# Patient Record
Sex: Female | Born: 1945
Health system: Southern US, Community
[De-identification: ages and names within clinical notes are randomized; demographics above are authoritative.]

## PROBLEM LIST (undated history)

## (undated) DIAGNOSIS — K219 Gastro-esophageal reflux disease without esophagitis: Secondary | ICD-10-CM

## (undated) DIAGNOSIS — K5790 Diverticulosis of intestine, part unspecified, without perforation or abscess without bleeding: Secondary | ICD-10-CM

## (undated) DIAGNOSIS — M329 Systemic lupus erythematosus, unspecified: Secondary | ICD-10-CM

## (undated) DIAGNOSIS — M1990 Unspecified osteoarthritis, unspecified site: Secondary | ICD-10-CM

## (undated) DIAGNOSIS — E785 Hyperlipidemia, unspecified: Secondary | ICD-10-CM

## (undated) DIAGNOSIS — H269 Unspecified cataract: Secondary | ICD-10-CM

## (undated) DIAGNOSIS — D175 Benign lipomatous neoplasm of intra-abdominal organs: Secondary | ICD-10-CM

## (undated) DIAGNOSIS — M2560 Stiffness of unspecified joint, not elsewhere classified: Secondary | ICD-10-CM

## (undated) DIAGNOSIS — J90 Pleural effusion, not elsewhere classified: Secondary | ICD-10-CM

## (undated) DIAGNOSIS — K824 Cholesterolosis of gallbladder: Secondary | ICD-10-CM

## (undated) DIAGNOSIS — Z860100 Personal history of colon polyps, unspecified: Secondary | ICD-10-CM

## (undated) DIAGNOSIS — G8929 Other chronic pain: Secondary | ICD-10-CM

## (undated) HISTORY — DX: Diverticulosis of intestine, part unspecified, without perforation or abscess without bleeding: K57.90

## (undated) HISTORY — PX: UPPER GASTROINTESTINAL ENDOSCOPY: SHX188

## (undated) HISTORY — DX: Benign lipomatous neoplasm of intra-abdominal organs: D17.5

## (undated) HISTORY — DX: Unspecified osteoarthritis, unspecified site: M19.90

## (undated) HISTORY — DX: Other chronic pain: G89.29

## (undated) HISTORY — PX: COLONOSCOPY: SHX174

## (undated) HISTORY — PX: CATARACT EXTRACTION, BILATERAL: SHX1313

## (undated) HISTORY — DX: Personal history of colon polyps, unspecified: Z86.0100

## (undated) HISTORY — DX: Stiffness of unspecified joint, not elsewhere classified: M25.60

## (undated) HISTORY — DX: Personal history of colonic polyps: Z86.010

## (undated) HISTORY — PX: BREAST LUMPECTOMY: SHX2

## (undated) HISTORY — DX: Cholesterolosis of gallbladder: K82.4

## (undated) HISTORY — DX: Unspecified cataract: H26.9

## (undated) HISTORY — PX: BREAST EXCISIONAL BIOPSY: SUR124

## (undated) HISTORY — PX: REPLACEMENT TOTAL KNEE: SUR1224

---

## 2012-06-10 DIAGNOSIS — M329 Systemic lupus erythematosus, unspecified: Secondary | ICD-10-CM

## 2012-06-10 DIAGNOSIS — IMO0002 Reserved for concepts with insufficient information to code with codable children: Secondary | ICD-10-CM

## 2012-06-10 HISTORY — DX: Systemic lupus erythematosus, unspecified: M32.9

## 2012-06-10 HISTORY — DX: Reserved for concepts with insufficient information to code with codable children: IMO0002

## 2012-12-03 ENCOUNTER — Encounter (HOSPITAL_COMMUNITY): Payer: Self-pay | Admitting: Emergency Medicine

## 2012-12-03 ENCOUNTER — Emergency Department (HOSPITAL_COMMUNITY)
Admission: EM | Admit: 2012-12-03 | Discharge: 2012-12-03 | Disposition: A | Discharge status: Home / Self Care | Payer: PRIVATE HEALTH INSURANCE | Attending: Emergency Medicine | Admitting: Emergency Medicine

## 2012-12-03 DIAGNOSIS — M329 Systemic lupus erythematosus, unspecified: Secondary | ICD-10-CM | POA: Insufficient documentation

## 2012-12-03 DIAGNOSIS — H6120 Impacted cerumen, unspecified ear: Secondary | ICD-10-CM | POA: Insufficient documentation

## 2012-12-03 DIAGNOSIS — H6121 Impacted cerumen, right ear: Secondary | ICD-10-CM

## 2012-12-03 HISTORY — DX: Systemic lupus erythematosus, unspecified: M32.9

## 2012-12-03 MED ORDER — DOCUSATE SODIUM 50 MG/5ML PO LIQD
50.0000 mg | Freq: Once | ORAL | Status: AC
  Administered 2012-12-03: 50 mg via ORAL
  Filled 2012-12-03: qty 10

## 2012-12-03 NOTE — ED Notes (Signed)
Patient states started having ear pain and "it feels blocked" x 2 months.  Patient states decreased hearing.

## 2012-12-03 NOTE — ED Notes (Addendum)
Flushed pt.s rt. Ear with sterile water/peroxide mixture X3.  Pt. Tolerated procedure without difficulty.  No wax returned on fluid.  Informed [name], PA with the results.  [name], PA came into assist  With wax removal of the lt. Ear. Moderate amount of was removed.  Pt. Tolerated without any difficulty.  Placed colace into pt.s rt. Ear to soften the wax.

## 2012-12-03 NOTE — ED Provider Notes (Signed)
History    CSN: [id number] Arrival date & time 12/03/12  4098  First MD Initiated Contact with Patient 12/03/12 [phone]     Chief Complaint  Patient presents with  . Otalgia   (Consider location/radiation/quality/duration/timing/severity/associated sxs/prior Treatment) Patient is a 67 y.o. female presenting with ear pain.  Otalgia Location:  Left Quality:  Aching Severity:  Mild Onset quality:  Gradual Duration:  2 months Timing:  Constant Progression:  Worsening Chronicity:  Recurrent Context: not direct blow, not elevation change, not foreign body in ear, not loud noise and no water in ear   Relieved by:  Nothing Worsened by:  Nothing tried Ineffective treatments:  None tried Associated symptoms: no abdominal pain, no congestion, no cough, no diarrhea, no ear discharge, no fever, no headaches, no hearing loss, no neck pain, no rash, no rhinorrhea, no sore throat, no tinnitus and no vomiting   Risk factors: no recent travel, no chronic ear infection and no prior ear surgery     Patient presents to the emergency department with chief complaint of left ear pain x2 months.  She has a history of cerumen impaction and states that this feels the same.  She has decreased hearing on that side.  She states that it feels "blocked and stuffy.".  She has some pain but denies any discharge from the ear, she denies any tonsillar swelling, sore throat, nasal discharge.  Patient has no sinus tenderness.  She has no neck pain.  No fevers no chills no nausea no vomiting. Past Medical History  Diagnosis Date  . Lupus    History reviewed. No pertinent past surgical history. No family history on file. History  Substance Use Topics  . Smoking status: Never Smoker   . Smokeless tobacco: Not on file  . Alcohol Use: No   OB History   Grav Para Term Preterm Abortions TAB SAB Ect Mult Living                 Review of Systems  Constitutional: Negative for fever.  HENT: Positive for ear pain.  Negative for hearing loss, congestion, sore throat, rhinorrhea, neck pain, tinnitus and ear discharge.   Respiratory: Negative for cough.   Gastrointestinal: Negative for vomiting, abdominal pain and diarrhea.  Skin: Negative for rash.  Neurological: Negative for headaches.    Allergies  Penicillins  Home Medications   Current Outpatient Rx  Name  Route  Sig  Dispense  Refill  . Cholecalciferol (VITAMIN D PO)   Oral   Take 1 tablet by mouth daily.         Marland Kitchen FOLIC ACID PO   Oral   Take 1 tablet by mouth daily.         . hydroxychloroquine (PLAQUENIL) 200 MG tablet   Oral   Take 300 mg by mouth daily.         . predniSONE (DELTASONE) 5 MG tablet   Oral   Take 5 mg by mouth daily.          BP 119/68  Pulse 80  Temp(Src) 98.6 F (37 C) (Oral)  Resp 18  SpO2 98% Physical Exam  Constitutional: She is oriented to person, place, and time. She appears well-developed and well-nourished. No distress.  HENT:  Head: Normocephalic and atraumatic.  Patient with cerumen bilaterally ears.  Right is soft and pliable however... cures the TM.  Left is thick hard and impacted and also obscures the TM.  No tragal or mastoid tenderness bilaterally full range  of motion of the neck the cervical lymphadenopathy  Eyes: Conjunctivae are normal. No scleral icterus.  Neck: Normal range of motion.  Cardiovascular: Normal rate, regular rhythm and normal heart sounds.  Exam reveals no gallop and no friction rub.   No murmur heard. Pulmonary/Chest: Effort normal and breath sounds normal. No respiratory distress.  Abdominal: Soft. Bowel sounds are normal. She exhibits no distension and no mass. There is no tenderness. There is no guarding.  Neurological: She is alert and oriented to person, place, and time.  Skin: Skin is warm and dry. She is not diaphoretic.    ED Course  Procedures (including critical care time) Labs Reviewed - No data to display No results found. No diagnosis  found.  MDM  I've ordered Colace for softening of the cerumen impaction.10:50 AM BP 119/68  Pulse 80  Temp(Src) 98.6 F (37 C) (Oral)  Resp 18  SpO2 98% Per nursing, irrigation was not working. I attempted to remove cerumen with curette, however, canal was becoming increasingly swollen. I have paced a call to ENT for suggestions.   I have spoken with Dr. Pollyann Kennedy who has asked that the patient   Arthor Captain, PA-C 12/03/12 1654

## 2012-12-03 NOTE — ED Notes (Signed)
[name], PA came in and informed pt. that she will be going directly across the street to ENT.

## 2012-12-03 NOTE — ED Notes (Signed)
Directions given to pt. For ENT;s office.

## 2012-12-04 ENCOUNTER — Other Ambulatory Visit: Payer: Self-pay | Admitting: Otolaryngology

## 2012-12-04 DIAGNOSIS — D497 Neoplasm of unspecified behavior of endocrine glands and other parts of nervous system: Secondary | ICD-10-CM

## 2012-12-09 NOTE — ED Provider Notes (Signed)
Medical screening examination/treatment/procedure(s) were performed by non-physician practitioner and as supervising physician I was immediately available for consultation/collaboration.  Raeford Razor, MD 12/09/12 [phone]

## 2013-02-11 ENCOUNTER — Ambulatory Visit
Admission: RE | Admit: 2013-02-11 | Discharge: 2013-02-11 | Disposition: A | Discharge status: Home / Self Care | Payer: Medicare Other | Source: Ambulatory Visit | Attending: Otolaryngology | Admitting: Otolaryngology

## 2013-02-11 DIAGNOSIS — E042 Nontoxic multinodular goiter: Secondary | ICD-10-CM | POA: Diagnosis not present

## 2013-02-11 DIAGNOSIS — D497 Neoplasm of unspecified behavior of endocrine glands and other parts of nervous system: Secondary | ICD-10-CM

## 2013-02-12 ENCOUNTER — Other Ambulatory Visit: Payer: Self-pay | Admitting: Otolaryngology

## 2013-02-12 ENCOUNTER — Other Ambulatory Visit (HOSPITAL_COMMUNITY)
Admission: RE | Admit: 2013-02-12 | Discharge: 2013-02-12 | Disposition: A | Discharge status: Home / Self Care | Payer: Medicare Other | Source: Ambulatory Visit | Attending: Otolaryngology | Admitting: Otolaryngology

## 2013-02-12 DIAGNOSIS — D497 Neoplasm of unspecified behavior of endocrine glands and other parts of nervous system: Secondary | ICD-10-CM | POA: Diagnosis not present

## 2013-02-12 DIAGNOSIS — E041 Nontoxic single thyroid nodule: Secondary | ICD-10-CM | POA: Diagnosis not present

## 2013-02-18 DIAGNOSIS — N951 Menopausal and female climacteric states: Secondary | ICD-10-CM | POA: Diagnosis not present

## 2013-02-18 DIAGNOSIS — M329 Systemic lupus erythematosus, unspecified: Secondary | ICD-10-CM | POA: Diagnosis not present

## 2013-02-18 DIAGNOSIS — Z0000 Encounter for general adult medical examination without abnormal findings: Secondary | ICD-10-CM | POA: Diagnosis not present

## 2013-02-18 DIAGNOSIS — M1990 Unspecified osteoarthritis, unspecified site: Secondary | ICD-10-CM | POA: Diagnosis not present

## 2013-02-19 DIAGNOSIS — Z23 Encounter for immunization: Secondary | ICD-10-CM | POA: Diagnosis not present

## 2013-02-19 DIAGNOSIS — N951 Menopausal and female climacteric states: Secondary | ICD-10-CM | POA: Diagnosis not present

## 2013-02-19 DIAGNOSIS — M329 Systemic lupus erythematosus, unspecified: Secondary | ICD-10-CM | POA: Diagnosis not present

## 2013-02-19 DIAGNOSIS — E559 Vitamin D deficiency, unspecified: Secondary | ICD-10-CM | POA: Diagnosis not present

## 2013-03-01 DIAGNOSIS — Z79899 Other long term (current) drug therapy: Secondary | ICD-10-CM | POA: Diagnosis not present

## 2013-03-01 DIAGNOSIS — H4420 Degenerative myopia, unspecified eye: Secondary | ICD-10-CM | POA: Diagnosis not present

## 2013-03-01 DIAGNOSIS — H2510 Age-related nuclear cataract, unspecified eye: Secondary | ICD-10-CM | POA: Diagnosis not present

## 2013-03-01 DIAGNOSIS — H04129 Dry eye syndrome of unspecified lacrimal gland: Secondary | ICD-10-CM | POA: Diagnosis not present

## 2013-03-01 DIAGNOSIS — H43819 Vitreous degeneration, unspecified eye: Secondary | ICD-10-CM | POA: Diagnosis not present

## 2013-03-01 DIAGNOSIS — H40019 Open angle with borderline findings, low risk, unspecified eye: Secondary | ICD-10-CM | POA: Diagnosis not present

## 2013-03-18 DIAGNOSIS — H35059 Retinal neovascularization, unspecified, unspecified eye: Secondary | ICD-10-CM | POA: Diagnosis not present

## 2013-03-18 DIAGNOSIS — H4420 Degenerative myopia, unspecified eye: Secondary | ICD-10-CM | POA: Diagnosis not present

## 2013-03-18 DIAGNOSIS — H33329 Round hole, unspecified eye: Secondary | ICD-10-CM | POA: Diagnosis not present

## 2013-03-18 DIAGNOSIS — H35419 Lattice degeneration of retina, unspecified eye: Secondary | ICD-10-CM | POA: Diagnosis not present

## 2013-03-18 DIAGNOSIS — H2510 Age-related nuclear cataract, unspecified eye: Secondary | ICD-10-CM | POA: Diagnosis not present

## 2013-04-07 DIAGNOSIS — M899 Disorder of bone, unspecified: Secondary | ICD-10-CM | POA: Diagnosis not present

## 2013-04-07 DIAGNOSIS — M159 Polyosteoarthritis, unspecified: Secondary | ICD-10-CM | POA: Diagnosis not present

## 2013-04-07 DIAGNOSIS — M329 Systemic lupus erythematosus, unspecified: Secondary | ICD-10-CM | POA: Diagnosis not present

## 2013-04-07 DIAGNOSIS — M3500 Sicca syndrome, unspecified: Secondary | ICD-10-CM | POA: Diagnosis not present

## 2013-08-17 DIAGNOSIS — M949 Disorder of cartilage, unspecified: Secondary | ICD-10-CM | POA: Diagnosis not present

## 2013-08-17 DIAGNOSIS — M3500 Sicca syndrome, unspecified: Secondary | ICD-10-CM | POA: Diagnosis not present

## 2013-08-17 DIAGNOSIS — M329 Systemic lupus erythematosus, unspecified: Secondary | ICD-10-CM | POA: Diagnosis not present

## 2013-08-17 DIAGNOSIS — M159 Polyosteoarthritis, unspecified: Secondary | ICD-10-CM | POA: Diagnosis not present

## 2013-08-17 DIAGNOSIS — M899 Disorder of bone, unspecified: Secondary | ICD-10-CM | POA: Diagnosis not present

## 2013-08-18 DIAGNOSIS — R5383 Other fatigue: Secondary | ICD-10-CM | POA: Diagnosis not present

## 2013-08-18 DIAGNOSIS — R5381 Other malaise: Secondary | ICD-10-CM | POA: Diagnosis not present

## 2013-08-18 DIAGNOSIS — R0789 Other chest pain: Secondary | ICD-10-CM | POA: Diagnosis not present

## 2013-08-18 DIAGNOSIS — M329 Systemic lupus erythematosus, unspecified: Secondary | ICD-10-CM | POA: Diagnosis not present

## 2013-09-02 DIAGNOSIS — M1990 Unspecified osteoarthritis, unspecified site: Secondary | ICD-10-CM | POA: Diagnosis not present

## 2013-09-02 DIAGNOSIS — M329 Systemic lupus erythematosus, unspecified: Secondary | ICD-10-CM | POA: Diagnosis not present

## 2014-12-05 ENCOUNTER — Other Ambulatory Visit: Payer: Self-pay | Admitting: Internal Medicine

## 2014-12-05 DIAGNOSIS — R1901 Right upper quadrant abdominal swelling, mass and lump: Secondary | ICD-10-CM | POA: Diagnosis not present

## 2014-12-05 DIAGNOSIS — R1011 Right upper quadrant pain: Secondary | ICD-10-CM

## 2014-12-06 ENCOUNTER — Other Ambulatory Visit: Payer: Self-pay | Admitting: Internal Medicine

## 2014-12-06 ENCOUNTER — Ambulatory Visit
Admission: RE | Admit: 2014-12-06 | Discharge: 2014-12-06 | Disposition: A | Discharge status: Home / Self Care | Payer: Medicare Other | Source: Ambulatory Visit | Attending: Internal Medicine | Admitting: Internal Medicine

## 2014-12-06 DIAGNOSIS — R1011 Right upper quadrant pain: Secondary | ICD-10-CM | POA: Diagnosis not present

## 2014-12-15 DIAGNOSIS — R1011 Right upper quadrant pain: Secondary | ICD-10-CM | POA: Diagnosis not present

## 2014-12-15 DIAGNOSIS — R1901 Right upper quadrant abdominal swelling, mass and lump: Secondary | ICD-10-CM | POA: Diagnosis not present

## 2014-12-15 DIAGNOSIS — M329 Systemic lupus erythematosus, unspecified: Secondary | ICD-10-CM | POA: Diagnosis not present

## 2014-12-15 DIAGNOSIS — R5383 Other fatigue: Secondary | ICD-10-CM | POA: Diagnosis not present

## 2015-03-30 DIAGNOSIS — E559 Vitamin D deficiency, unspecified: Secondary | ICD-10-CM | POA: Diagnosis not present

## 2015-03-30 DIAGNOSIS — R1901 Right upper quadrant abdominal swelling, mass and lump: Secondary | ICD-10-CM | POA: Diagnosis not present

## 2015-03-30 DIAGNOSIS — M329 Systemic lupus erythematosus, unspecified: Secondary | ICD-10-CM | POA: Diagnosis not present

## 2015-03-30 DIAGNOSIS — E782 Mixed hyperlipidemia: Secondary | ICD-10-CM | POA: Diagnosis not present

## 2015-03-30 DIAGNOSIS — Z23 Encounter for immunization: Secondary | ICD-10-CM | POA: Diagnosis not present

## 2015-03-30 DIAGNOSIS — R5383 Other fatigue: Secondary | ICD-10-CM | POA: Diagnosis not present

## 2015-03-30 DIAGNOSIS — Z780 Asymptomatic menopausal state: Secondary | ICD-10-CM | POA: Diagnosis not present

## 2015-03-30 DIAGNOSIS — M150 Primary generalized (osteo)arthritis: Secondary | ICD-10-CM | POA: Diagnosis not present

## 2015-04-05 DIAGNOSIS — M150 Primary generalized (osteo)arthritis: Secondary | ICD-10-CM | POA: Diagnosis not present

## 2015-04-05 DIAGNOSIS — R21 Rash and other nonspecific skin eruption: Secondary | ICD-10-CM | POA: Diagnosis not present

## 2015-04-05 DIAGNOSIS — Z23 Encounter for immunization: Secondary | ICD-10-CM | POA: Diagnosis not present

## 2015-04-05 DIAGNOSIS — E782 Mixed hyperlipidemia: Secondary | ICD-10-CM | POA: Diagnosis not present

## 2015-04-05 DIAGNOSIS — M329 Systemic lupus erythematosus, unspecified: Secondary | ICD-10-CM | POA: Diagnosis not present

## 2015-04-14 DIAGNOSIS — L931 Subacute cutaneous lupus erythematosus: Secondary | ICD-10-CM | POA: Diagnosis not present

## 2015-05-19 DIAGNOSIS — E782 Mixed hyperlipidemia: Secondary | ICD-10-CM | POA: Diagnosis not present

## 2015-05-19 DIAGNOSIS — M329 Systemic lupus erythematosus, unspecified: Secondary | ICD-10-CM | POA: Diagnosis not present

## 2015-05-26 DIAGNOSIS — R490 Dysphonia: Secondary | ICD-10-CM | POA: Diagnosis not present

## 2015-05-26 DIAGNOSIS — R1901 Right upper quadrant abdominal swelling, mass and lump: Secondary | ICD-10-CM | POA: Diagnosis not present

## 2015-05-26 DIAGNOSIS — E782 Mixed hyperlipidemia: Secondary | ICD-10-CM | POA: Diagnosis not present

## 2015-05-26 DIAGNOSIS — M329 Systemic lupus erythematosus, unspecified: Secondary | ICD-10-CM | POA: Diagnosis not present

## 2015-07-04 DIAGNOSIS — K219 Gastro-esophageal reflux disease without esophagitis: Secondary | ICD-10-CM | POA: Diagnosis not present

## 2015-07-04 DIAGNOSIS — R490 Dysphonia: Secondary | ICD-10-CM | POA: Diagnosis not present

## 2015-08-02 DIAGNOSIS — K219 Gastro-esophageal reflux disease without esophagitis: Secondary | ICD-10-CM | POA: Diagnosis not present

## 2015-08-02 DIAGNOSIS — R490 Dysphonia: Secondary | ICD-10-CM | POA: Diagnosis not present

## 2015-09-06 ENCOUNTER — Encounter (HOSPITAL_COMMUNITY): Payer: Self-pay | Admitting: Emergency Medicine

## 2015-09-06 DIAGNOSIS — R05 Cough: Secondary | ICD-10-CM | POA: Diagnosis not present

## 2015-09-06 DIAGNOSIS — R079 Chest pain, unspecified: Secondary | ICD-10-CM | POA: Diagnosis not present

## 2015-09-06 DIAGNOSIS — R61 Generalized hyperhidrosis: Secondary | ICD-10-CM | POA: Insufficient documentation

## 2015-09-06 DIAGNOSIS — R0602 Shortness of breath: Secondary | ICD-10-CM | POA: Insufficient documentation

## 2015-09-06 NOTE — ED Notes (Signed)
Pt. reports central chest pressure with mild SOB , dry cough  and diaphoresis , denies fever / no emesis .

## 2015-09-07 ENCOUNTER — Emergency Department (HOSPITAL_COMMUNITY): Payer: Medicare Other

## 2015-09-07 ENCOUNTER — Emergency Department (HOSPITAL_COMMUNITY)
Admission: EM | Admit: 2015-09-07 | Discharge: 2015-09-07 | Disposition: A | Discharge status: Home / Self Care | Payer: Medicare Other | Attending: Emergency Medicine | Admitting: Emergency Medicine

## 2015-09-07 DIAGNOSIS — R0602 Shortness of breath: Secondary | ICD-10-CM | POA: Diagnosis not present

## 2015-09-07 DIAGNOSIS — R079 Chest pain, unspecified: Secondary | ICD-10-CM | POA: Diagnosis not present

## 2015-09-07 LAB — BASIC METABOLIC PANEL
Anion gap: 8 (ref 5–15)
BUN: 14 mg/dL (ref 6–20)
CO2: 22 mmol/L (ref 22–32)
Calcium: 8.6 mg/dL — ABNORMAL LOW (ref 8.9–10.3)
Chloride: 111 mmol/L (ref 101–111)
Creatinine, Ser: 0.91 mg/dL (ref 0.44–1.00)
GFR calc Af Amer: >60 mL/min (ref >60)
GFR calc non Af Amer: >60 mL/min (ref >60)
Glucose, Bld: 101 mg/dL — ABNORMAL HIGH (ref 65–99)
Potassium: 4.3 mmol/L (ref 3.5–5.1)
Sodium: 141 mmol/L (ref 135–145)

## 2015-09-07 LAB — CBC
HCT: 43 % (ref 36.0–46.0)
Hemoglobin: 14.1 g/dL (ref 12.0–15.0)
MCH: 29.2 pg (ref 26.0–34.0)
MCHC: 32.8 g/dL (ref 30.0–36.0)
MCV: 89 fL (ref 78.0–100.0)
Platelets: 121 10*3/uL — ABNORMAL LOW (ref 150–400)
RBC: 4.83 MIL/uL (ref 3.87–5.11)
RDW: 14.6 % (ref 11.5–15.5)
WBC: 8.7 10*3/uL (ref 4.0–10.5)

## 2015-09-07 LAB — I-STAT TROPONIN, ED: Troponin i, poc: 0 ng/mL (ref 0.00–0.08)

## 2015-09-07 NOTE — ED Notes (Signed)
Pt no answer x2 when called in waiting room.

## 2015-09-09 DIAGNOSIS — J90 Pleural effusion, not elsewhere classified: Secondary | ICD-10-CM

## 2015-09-09 HISTORY — DX: Pleural effusion, not elsewhere classified: J90

## 2015-09-21 ENCOUNTER — Encounter (HOSPITAL_COMMUNITY): Payer: Self-pay | Admitting: *Deleted

## 2015-09-21 ENCOUNTER — Emergency Department (HOSPITAL_COMMUNITY): Payer: Medicare Other

## 2015-09-21 ENCOUNTER — Inpatient Hospital Stay (HOSPITAL_COMMUNITY)
Admission: EM | Admit: 2015-09-21 | Discharge: 2015-09-24 | DRG: 291 | Disposition: A | Discharge status: Home / Self Care | Payer: Medicare Other | Attending: Internal Medicine | Admitting: Internal Medicine

## 2015-09-21 DIAGNOSIS — Z880 Allergy status to penicillin: Secondary | ICD-10-CM | POA: Diagnosis not present

## 2015-09-21 DIAGNOSIS — E785 Hyperlipidemia, unspecified: Secondary | ICD-10-CM | POA: Diagnosis not present

## 2015-09-21 DIAGNOSIS — J90 Pleural effusion, not elsewhere classified: Secondary | ICD-10-CM | POA: Diagnosis not present

## 2015-09-21 DIAGNOSIS — R05 Cough: Secondary | ICD-10-CM | POA: Diagnosis not present

## 2015-09-21 DIAGNOSIS — Z66 Do not resuscitate: Secondary | ICD-10-CM | POA: Diagnosis present

## 2015-09-21 DIAGNOSIS — K219 Gastro-esophageal reflux disease without esophagitis: Secondary | ICD-10-CM | POA: Diagnosis present

## 2015-09-21 DIAGNOSIS — Z96651 Presence of right artificial knee joint: Secondary | ICD-10-CM | POA: Diagnosis not present

## 2015-09-21 DIAGNOSIS — M329 Systemic lupus erythematosus, unspecified: Secondary | ICD-10-CM | POA: Diagnosis not present

## 2015-09-21 DIAGNOSIS — E876 Hypokalemia: Secondary | ICD-10-CM | POA: Diagnosis not present

## 2015-09-21 DIAGNOSIS — Z7952 Long term (current) use of systemic steroids: Secondary | ICD-10-CM | POA: Diagnosis not present

## 2015-09-21 DIAGNOSIS — I5032 Chronic diastolic (congestive) heart failure: Secondary | ICD-10-CM | POA: Diagnosis not present

## 2015-09-21 DIAGNOSIS — R221 Localized swelling, mass and lump, neck: Secondary | ICD-10-CM

## 2015-09-21 DIAGNOSIS — Z86711 Personal history of pulmonary embolism: Secondary | ICD-10-CM | POA: Diagnosis not present

## 2015-09-21 DIAGNOSIS — R0600 Dyspnea, unspecified: Secondary | ICD-10-CM

## 2015-09-21 DIAGNOSIS — J9601 Acute respiratory failure with hypoxia: Secondary | ICD-10-CM | POA: Diagnosis not present

## 2015-09-21 DIAGNOSIS — R0602 Shortness of breath: Secondary | ICD-10-CM | POA: Diagnosis not present

## 2015-09-21 DIAGNOSIS — D696 Thrombocytopenia, unspecified: Secondary | ICD-10-CM | POA: Diagnosis present

## 2015-09-21 DIAGNOSIS — R0789 Other chest pain: Secondary | ICD-10-CM | POA: Diagnosis not present

## 2015-09-21 HISTORY — DX: Hyperlipidemia, unspecified: E78.5

## 2015-09-21 HISTORY — DX: Pleural effusion, not elsewhere classified: J90

## 2015-09-21 HISTORY — DX: Gastro-esophageal reflux disease without esophagitis: K21.9

## 2015-09-21 LAB — HEPATIC FUNCTION PANEL
ALT: 15 U/L (ref 14–54)
AST: 24 U/L (ref 15–41)
Albumin: 2.4 g/dL — ABNORMAL LOW (ref 3.5–5.0)
Alkaline Phosphatase: 41 U/L (ref 38–126)
Bilirubin, Direct: 0.2 mg/dL (ref 0.1–0.5)
Indirect Bilirubin: 0.5 mg/dL (ref 0.3–0.9)
Total Bilirubin: 0.7 mg/dL (ref 0.3–1.2)
Total Protein: 6.6 g/dL (ref 6.5–8.1)

## 2015-09-21 LAB — D-DIMER, QUANTITATIVE (NOT AT ARMC): D-Dimer, Quant: 1.85 ug/mL-FEU — ABNORMAL HIGH (ref 0.00–0.50)

## 2015-09-21 LAB — BRAIN NATRIURETIC PEPTIDE: B Natriuretic Peptide: 35 pg/mL (ref 0.0–100.0)

## 2015-09-21 LAB — BASIC METABOLIC PANEL
Anion gap: 12 (ref 5–15)
BUN: 15 mg/dL (ref 6–20)
CO2: 23 mmol/L (ref 22–32)
Calcium: 8.4 mg/dL — ABNORMAL LOW (ref 8.9–10.3)
Chloride: 107 mmol/L (ref 101–111)
Creatinine, Ser: 0.71 mg/dL (ref 0.44–1.00)
GFR calc Af Amer: >60 mL/min (ref >60)
GFR calc non Af Amer: >60 mL/min (ref >60)
Glucose, Bld: 98 mg/dL (ref 65–99)
Potassium: 3.2 mmol/L — ABNORMAL LOW (ref 3.5–5.1)
Sodium: 142 mmol/L (ref 135–145)

## 2015-09-21 LAB — CBC
HCT: 41.8 % (ref 36.0–46.0)
Hemoglobin: 13.8 g/dL (ref 12.0–15.0)
MCH: 28.8 pg (ref 26.0–34.0)
MCHC: 33 g/dL (ref 30.0–36.0)
MCV: 87.3 fL (ref 78.0–100.0)
Platelets: 99 10*3/uL — ABNORMAL LOW (ref 150–400)
RBC: 4.79 MIL/uL (ref 3.87–5.11)
RDW: 14.7 % (ref 11.5–15.5)
WBC: 8.3 10*3/uL (ref 4.0–10.5)

## 2015-09-21 LAB — TROPONIN I: Troponin I: <0.03 ng/mL (ref <0.031)

## 2015-09-21 LAB — I-STAT TROPONIN, ED: Troponin i, poc: 0.01 ng/mL (ref 0.00–0.08)

## 2015-09-21 MED ORDER — FUROSEMIDE 10 MG/ML IJ SOLN
40.0000 mg | Freq: Two times a day (BID) | INTRAMUSCULAR | Status: AC
  Administered 2015-09-21 – 2015-09-22 (×3): 40 mg via INTRAVENOUS
  Filled 2015-09-21 (×3): qty 4

## 2015-09-21 MED ORDER — SENNOSIDES-DOCUSATE SODIUM 8.6-50 MG PO TABS: 1.0000 | ORAL_TABLET | Freq: Every evening | ORAL | Status: DC | PRN

## 2015-09-21 MED ORDER — ACETAMINOPHEN 650 MG RE SUPP: 650.0000 mg | Freq: Four times a day (QID) | RECTAL | Status: DC | PRN

## 2015-09-21 MED ORDER — PANTOPRAZOLE SODIUM 40 MG PO TBEC
40.0000 mg | DELAYED_RELEASE_TABLET | Freq: Every day | ORAL | Status: DC
  Administered 2015-09-21 – 2015-09-23 (×3): 40 mg via ORAL
  Filled 2015-09-21 (×3): qty 1

## 2015-09-21 MED ORDER — POTASSIUM CHLORIDE CRYS ER 20 MEQ PO TBCR
40.0000 meq | EXTENDED_RELEASE_TABLET | Freq: Two times a day (BID) | ORAL | Status: DC
  Administered 2015-09-21 – 2015-09-23 (×6): 40 meq via ORAL
  Filled 2015-09-21 (×6): qty 2

## 2015-09-21 MED ORDER — GUAIFENESIN-DM 100-10 MG/5ML PO SYRP
5.0000 mL | ORAL_SOLUTION | ORAL | Status: DC | PRN
  Administered 2015-09-21 – 2015-09-24 (×2): 5 mL via ORAL
  Filled 2015-09-21 (×2): qty 5

## 2015-09-21 MED ORDER — KCL IN DEXTROSE-NACL 20-5-0.45 MEQ/L-%-% IV SOLN
INTRAVENOUS | Status: AC
  Administered 2015-09-21 (×2): via INTRAVENOUS
  Filled 2015-09-21 (×3): qty 1000

## 2015-09-21 MED ORDER — ONDANSETRON HCL 4 MG/2ML IJ SOLN: 4.0000 mg | Freq: Four times a day (QID) | INTRAMUSCULAR | Status: DC | PRN

## 2015-09-21 MED ORDER — ALBUTEROL SULFATE (2.5 MG/3ML) 0.083% IN NEBU: 2.5000 mg | INHALATION_SOLUTION | RESPIRATORY_TRACT | Status: DC | PRN

## 2015-09-21 MED ORDER — HYDROCODONE-ACETAMINOPHEN 5-325 MG PO TABS: 1.0000 | ORAL_TABLET | ORAL | Status: DC | PRN

## 2015-09-21 MED ORDER — ONDANSETRON HCL 4 MG PO TABS: 4.0000 mg | ORAL_TABLET | Freq: Four times a day (QID) | ORAL | Status: DC | PRN

## 2015-09-21 MED ORDER — ACETAMINOPHEN 325 MG PO TABS
650.0000 mg | ORAL_TABLET | Freq: Four times a day (QID) | ORAL | Status: DC | PRN
  Administered 2015-09-22 (×2): 650 mg via ORAL
  Filled 2015-09-21 (×2): qty 2

## 2015-09-21 MED ORDER — ATORVASTATIN CALCIUM 20 MG PO TABS
20.0000 mg | ORAL_TABLET | Freq: Every day | ORAL | Status: DC
  Administered 2015-09-21 – 2015-09-23 (×3): 20 mg via ORAL
  Filled 2015-09-21 (×3): qty 1

## 2015-09-21 MED ORDER — IOPAMIDOL (ISOVUE-370) INJECTION 76%
INTRAVENOUS | Status: AC
  Administered 2015-09-21: 100 mL
  Filled 2015-09-21: qty 100

## 2015-09-21 MED ORDER — FUROSEMIDE 10 MG/ML IJ SOLN
40.0000 mg | Freq: Once | INTRAMUSCULAR | Status: AC
  Administered 2015-09-21: 40 mg via INTRAVENOUS
  Filled 2015-09-21: qty 4

## 2015-09-21 NOTE — H&P (Signed)
[facility] History and Physical  Jane Chavez DOB: [birth year] DOA: 09/21/2015  Referring physician:  PCP: No PCP Per Patient   Chief Complaint: "I just felt short of breath and had pain in my back."  HPI: Jane Chavez is a 70 y.o. female  With a past medical history of lupus hyperlipidemia and GERD presents with complaint of shortness of breath for the past 2-3 weeks. Patient states that onset was sudden with gradual worsening over last few weeks. Patient states that she came in last night to the emergency room due to pain that was new. Pain is in the upper back and does not radiate. Patient is unable to characterize the quality of the pain. Patient rates the pain is moderate to severe. Patient denies any trauma to the chest. No recent upper respiratory tract infections. Patient denies any recent fever cough nausea vomiting hemoptysis dizziness weakness.  Patient has history significant for lupus. Was on chronic prednisone at 5 mg until recently, stopped in the last year.    CODE STATUS DNR/DNI   Review of Systems:  Constitutional:  No weight loss, night sweats, Fevers, chills, fatigue.  HEENT:  No headaches, Difficulty swallowing,Tooth/dental problems; pos Sore throat (due to GERD) Cardio-vascular:  No chest pain, Orthopnea, PND, swelling in lower extremities, anasarca, dizziness, palpitations  GI:  No abdominal pain, nausea, vomiting, diarrhea, change in bowel habits, loss of appetite; positive heartburn Resp:   See history of present illness Skin:  no rash or lesions.  GU:  no dysuria, change in color of urine, no urgency or frequency.  Musculoskeletal:   No joint pain or swelling. No decreased range of motion. No back pain.  Psych:  No change in mood or affect. No depression or anxiety. No memory loss. Neuro:  No change in sensation, unilateral strength, or cognitive abilities All other systems were reviewed and are negative.  Past Medical  History  Diagnosis Date  . Lupus ([place])   . GERD (gastroesophageal reflux disease)   . HLD (hyperlipidemia)    Past Surgical History  Procedure Laterality Date  . Replacement total knee      Right  . Breast lumpectomy     Social History:  reports that she has never smoked. She does not have any smokeless tobacco history on file. She reports that she does not drink alcohol or use illicit drugs.  Allergies  Allergen Reactions  . Penicillins Shortness Of Breath    Has patient had a PCN reaction causing immediate rash, facial/tongue/throat swelling, SOB or lightheadedness with hypotension: Yes Has patient had a PCN reaction causing severe rash involving mucus membranes or skin necrosis: No Has patient had a PCN reaction that required hospitalization No Has patient had a PCN reaction occurring within the last 10 years: No If all of the above answers are "NO", then may proceed with Cephalosporin use.     History reviewed. No pertinent family history.   Prior to Admission medications   Medication Sig Start Date End Date Taking? Authorizing Provider  atorvastatin (LIPITOR) 20 MG tablet Take 20 mg by mouth daily.   Yes Historical Provider, MD  omeprazole (PRILOSEC) 20 MG capsule Take 20 mg by mouth daily.   Yes Historical Provider, MD   Physical Exam: Filed Vitals:   09/21/15 1100 09/21/15 1124 09/21/15 1130 09/21/15 1200  BP: 104/59 104/59 120/70 104/66  Pulse: 100 101 101 93  Temp:  97.9 F (36.6 C)    TempSrc:  Oral    Resp: 17 25  26 27  SpO2: 96% 97% 97% 97%    Wt Readings from Last 3 Encounters:  09/06/15 71.016 kg (156 lb 9 oz)    General: Appears calm and comfortable Eyes: PERRL, EOMI, normal lids, iris ENT: grossly normal hearing, lips & tongue Neck: no LAD, masses or thyromegaly Cardiovascular: tachy RR, no m/r/g. No LE edema.  Respiratory: Increased respiratory effort, tachypnea, decrease lung sounds basilar bilaterally, no wheezes Abdomen:  soft, ntnd Skin: no  rash or induration seen on limited exam Musculoskeletal: grossly normal tone BUE/BLE Psychiatric:  grossly normal mood and affect, speech fluent and appropriate Neurologic:  CN 2-12 grossly intact, moves all extremities in coordinated fashion.          Labs on Admission:  Basic Metabolic Panel:  Recent Labs Lab 09/21/15 0324  NA 142  K 3.2*  CL 107  CO2 23  GLUCOSE 98  BUN 15  CREATININE 0.71  CALCIUM 8.4*   Liver Function Tests:  Recent Labs Lab 09/21/15 0744  AST 24  ALT 15  ALKPHOS 41  BILITOT 0.7  PROT 6.6  ALBUMIN 2.4*   No results for input(s): LIPASE, AMYLASE in the last 168 hours. No results for input(s): AMMONIA in the last 168 hours. CBC:  Recent Labs Lab 09/21/15 0324  WBC 8.3  HGB 13.8  HCT 41.8  MCV 87.3  PLT 99*   Cardiac Enzymes:  Recent Labs Lab 09/21/15 0733  TROPONINI <0.03    BNP (last 3 results)  Recent Labs  09/21/15 0733  BNP 35.0    ProBNP (last 3 results) No results for input(s): PROBNP in the last 8760 hours.   Creatinine clearance cannot be calculated (Unknown ideal weight.)  CBG: No results for input(s): GLUCAP in the last 168 hours.  Radiological Exams on Admission: Dg Chest 2 View  09/21/2015  CLINICAL DATA:  Acute onset of mid chest pain and cough. Initial encounter. EXAM: CHEST  2 VIEW COMPARISON:  Chest radiograph performed 09/07/2015 FINDINGS: The lungs are well-aerated. Small to moderate bilateral pleural effusions are noted, left greater than right. Mild bibasilar opacities may reflect mild interstitial edema. There is no evidence of pneumothorax. The heart is mildly enlarged. No acute osseous abnormalities are seen. IMPRESSION: Small to moderate bilateral pleural effusions, left greater than right. Mild bibasilar opacities may reflect mild interstitial edema. Electronically Signed   By: Garald Balding M.D.   On: 09/21/2015 03:57   Ct Angio Chest Pe W/cm &/or Wo Cm  09/21/2015  CLINICAL DATA:  Shortness of  breath. EXAM: CT ANGIOGRAPHY CHEST WITH CONTRAST TECHNIQUE: Multidetector CT imaging of the chest was performed using the standard protocol during bolus administration of intravenous contrast. Multiplanar CT image reconstructions and MIPs were obtained to evaluate the vascular anatomy. CONTRAST:  100 mL of Isovue 370 intravenously. COMPARISON:  Chest radiograph of same day. FINDINGS: No pneumothorax is noted. Large bilateral pleural effusions are noted with adjacent atelectasis of the lower lobes, left greater than right. Diffusely enlarged thyroid gland is noted with possible left thyroid mass. There is no definite evidence of pulmonary embolus. There is no evidence of thoracic aortic dissection or aneurysm. Visualized portion of upper abdomen is unremarkable. No mediastinal adenopathy is noted. No significant osseous abnormality is noted. Review of the MIP images confirms the above findings. IMPRESSION: No definite evidence of pulmonary embolus. Large bilateral pleural effusions are noted with adjacent atelectasis of the lower lobes, left greater than right. Diffusely enlarged thyroid gland is noted with possible left thyroid mass.  Dedicated thyroid ultrasound is recommended on nonemergent basis. Electronically Signed   By: Marijo Conception, M.D.   On: 09/21/2015 10:02    EKG: Unable to view in  MUSE  Assessment/Plan Principal Problem:   Pleural effusion Active Problems:   GERD (gastroesophageal reflux disease)   Pleural effusion -Patient is in mild to moderate respiratory distress, will do a therapeutic/diagnostic thoracentesis Ultrasound thoracentesis ordered with labs Differential diagnosis: Empyema, CHF, inflammatory fluid collection related to lupus Incentive spirometer Continue Lasix started in the ED AM Labs  Hypokalemia - oral replacement, recheck in AM  GERD -Per history lectin control Switching patient to pantoprazole while inpatient   Code Status: dnr/dni  DVT Prophylaxis:  SCD Family Communication: spoke to son Disposition Plan: Pending Improvement    Elwin Mocha, MD Family Medicine [facility] [email or web address] Password [email or web address]

## 2015-09-21 NOTE — ED Notes (Signed)
Admitting MD is at the bedside 

## 2015-09-21 NOTE — ED Provider Notes (Signed)
CSN: [id number]     Arrival date & time 09/21/15  0308 History   First MD Initiated Contact with Patient 09/21/15 [phone]     Chief Complaint  Patient presents with  . Chest Pain     (Consider location/radiation/quality/duration/timing/severity/associated sxs/prior Treatment) HPI Comments: Patient presents with shortness of breath that has been progressively worsening since March 30. She came to the ED that day but left due to the wait and was not seen. She returns with worsening shortness of breath, dyspnea on exertion and orthopnea. States she has a cough productive of clear and intact mucous. No fever. For the past week she's had constant chest tightness that does not radiate. She endorses no fever or vomiting. No leg swelling or weight gain. No history of heart failure or CAD. She does have a history of lupus and previous pulmonary embolism about 10 years ago. She is not on any blood thinners or other medications for lupus. She denies ever having a heart attack. She denies any fever or chills.  Patient is a 70 y.o. female presenting with chest pain. The history is provided by the patient.  Chest Pain Associated symptoms: abdominal pain, cough, fatigue and shortness of breath   Associated symptoms: no dizziness, no headache, no nausea and not vomiting     Past Medical History  Diagnosis Date  . Lupus ([place])   . GERD (gastroesophageal reflux disease)   . HLD (hyperlipidemia)    Past Surgical History  Procedure Laterality Date  . Replacement total knee      Right  . Breast lumpectomy     History reviewed. No pertinent family history. Social History  Substance Use Topics  . Smoking status: Never Smoker   . Smokeless tobacco: None  . Alcohol Use: No   OB History    No data available     Review of Systems  Constitutional: Positive for activity change, appetite change and fatigue.  HENT: Negative for congestion.   Eyes: Negative for visual disturbance.  Respiratory: Positive for  cough, chest tightness and shortness of breath.   Cardiovascular: Positive for chest pain. Negative for leg swelling.  Gastrointestinal: Positive for abdominal pain. Negative for nausea and vomiting.  Genitourinary: Negative for dysuria, hematuria, vaginal bleeding and vaginal discharge.  Musculoskeletal: Negative for myalgias, joint swelling and arthralgias.  Skin: Negative for wound.  Neurological: Negative for dizziness and headaches.  A complete 10 system review of systems was obtained and all systems are negative except as noted in the HPI and PMH.      Allergies  Penicillins  Home Medications   Prior to Admission medications   Medication Sig Start Date End Date Taking? Authorizing Provider  atorvastatin (LIPITOR) 20 MG tablet Take 20 mg by mouth daily.   Yes Historical Provider, MD  omeprazole (PRILOSEC) 20 MG capsule Take 20 mg by mouth daily.   Yes Historical Provider, MD   BP 107/84 mmHg  Pulse 99  Temp(Src) 97.9 F (36.6 C) (Oral)  Resp 16  Ht 5\' 2"  (1.575 m)  Wt 152 lb 14.4 oz (69.355 kg)  BMI 27.96 kg/m2  SpO2 100% Physical Exam  Constitutional: She is oriented to person, place, and time. She appears well-developed and well-nourished. No distress.  Dyspneic with conversation  HENT:  Head: Normocephalic and atraumatic.  Mouth/Throat: Oropharynx is clear and moist. No oropharyngeal exudate.  Eyes: Conjunctivae and EOM are normal. Pupils are equal, round, and reactive to light.  Neck: Normal range of motion. Neck supple.  No  meningismus.  Cardiovascular: Normal rate, normal heart sounds and intact distal pulses.   No murmur heard. Tachycardic 100s.  Pulmonary/Chest: Effort normal. No respiratory distress. She has rales.  Decreased breath sounds at bases  Abdominal: Soft. There is no tenderness. There is no rebound and no guarding.  Musculoskeletal: Normal range of motion. She exhibits no edema or tenderness.  No peripheral edema  Neurological: She is alert and  oriented to person, place, and time. No cranial nerve deficit. She exhibits normal muscle tone. Coordination normal.  No ataxia on finger to nose bilaterally. No pronator drift. 5/5 strength throughout. CN 2-12 intact.Equal grip strength. Sensation intact.   Skin: Skin is warm.  Psychiatric: She has a normal mood and affect. Her behavior is normal.  Nursing note and vitals reviewed.   ED Course  Procedures (including critical care time) Labs Review Labs Reviewed  BASIC METABOLIC PANEL - Abnormal; Notable for the following:    Potassium 3.2 (*)    Calcium 8.4 (*)    All other components within normal limits  CBC - Abnormal; Notable for the following:    Platelets 99 (*)    All other components within normal limits  D-DIMER, QUANTITATIVE (NOT AT [facility]) - Abnormal; Notable for the following:    D-Dimer, Quant 1.85 (*)    All other components within normal limits  HEPATIC FUNCTION PANEL - Abnormal; Notable for the following:    Albumin 2.4 (*)    All other components within normal limits  TROPONIN I  BRAIN NATRIURETIC PEPTIDE  URINALYSIS, ROUTINE W REFLEX MICROSCOPIC (NOT AT [facility])  Randolm Idol, ED    Imaging Review Dg Chest 2 View  09/21/2015  CLINICAL DATA:  Acute onset of mid chest pain and cough. Initial encounter. EXAM: CHEST  2 VIEW COMPARISON:  Chest radiograph performed 09/07/2015 FINDINGS: The lungs are well-aerated. Small to moderate bilateral pleural effusions are noted, left greater than right. Mild bibasilar opacities may reflect mild interstitial edema. There is no evidence of pneumothorax. The heart is mildly enlarged. No acute osseous abnormalities are seen. IMPRESSION: Small to moderate bilateral pleural effusions, left greater than right. Mild bibasilar opacities may reflect mild interstitial edema. Electronically Signed   By: Garald Balding M.D.   On: 09/21/2015 03:57   Ct Angio Chest Pe W/cm &/or Wo Cm  09/21/2015  CLINICAL DATA:  Shortness of breath. EXAM: CT  ANGIOGRAPHY CHEST WITH CONTRAST TECHNIQUE: Multidetector CT imaging of the chest was performed using the standard protocol during bolus administration of intravenous contrast. Multiplanar CT image reconstructions and MIPs were obtained to evaluate the vascular anatomy. CONTRAST:  100 mL of Isovue 370 intravenously. COMPARISON:  Chest radiograph of same day. FINDINGS: No pneumothorax is noted. Large bilateral pleural effusions are noted with adjacent atelectasis of the lower lobes, left greater than right. Diffusely enlarged thyroid gland is noted with possible left thyroid mass. There is no definite evidence of pulmonary embolus. There is no evidence of thoracic aortic dissection or aneurysm. Visualized portion of upper abdomen is unremarkable. No mediastinal adenopathy is noted. No significant osseous abnormality is noted. Review of the MIP images confirms the above findings. IMPRESSION: No definite evidence of pulmonary embolus. Large bilateral pleural effusions are noted with adjacent atelectasis of the lower lobes, left greater than right. Diffusely enlarged thyroid gland is noted with possible left thyroid mass. Dedicated thyroid ultrasound is recommended on nonemergent basis. Electronically Signed   By: Marijo Conception, M.D.   On: 09/21/2015 10:02   I have  personally reviewed and evaluated these images and lab results as part of my medical decision-making.   EKG Interpretation   Date/Time:  Thursday September 21 2015 03:15:37 EDT Ventricular Rate:  109 PR Interval:  128 QRS Duration: 72 QT Interval:  346 QTC Calculation: 465 R Axis:   53 Text Interpretation:  Sinus tachycardia Otherwise normal ECG Rate faster  Confirmed by Wyvonnia Dusky  MD, Abdulkareem Badolato ([id number]) on 09/21/2015 7:16:18 AM Also  confirmed by Wyvonnia Dusky  MD, Iyannah Blake [phone]), editor [place]  CCT, [place]  ([id number])  on 09/21/2015 8:21:44 AM      MDM   Final diagnoses:  Pleural effusion  Dyspnea   2 weeks of progressively worsening shortness  of breath with some cough and chest tightness. EKG was sinus tachycardia. Chest x-ray shows bilateral pleural effusions slightly worse from March 30.  Chest x-ray shows worsening pleural effusions. EKG sinus tachycardia. Remote history of PE.  BNP is normal. Unclear etiology of patient's effusions, possibly related to her lupus. The CT does not show any pulmonary embolism. Plan admission for thoracentesis and further diagnostic workup of her pleural effusions causing her dyspnea and chest pain.  D/w Nevin Bloodgood NP.   Ezequiel Essex, MD 09/21/15 [phone]

## 2015-09-21 NOTE — ED Notes (Signed)
Pt c/o chest pain x 2 weeks. States she was here 09/07/15 but the wait was too long.

## 2015-09-21 NOTE — ED Notes (Signed)
Attempted report 

## 2015-09-21 NOTE — ED Notes (Signed)
Family at bedside. Updated family on plan , pt awaiting ct scan and blood work results

## 2015-09-21 NOTE — ED Notes (Signed)
Patient transported to CT 

## 2015-09-22 ENCOUNTER — Observation Stay (HOSPITAL_COMMUNITY): Payer: Medicare Other

## 2015-09-22 ENCOUNTER — Observation Stay (HOSPITAL_BASED_OUTPATIENT_CLINIC_OR_DEPARTMENT_OTHER): Payer: Medicare Other

## 2015-09-22 DIAGNOSIS — Z96651 Presence of right artificial knee joint: Secondary | ICD-10-CM | POA: Diagnosis not present

## 2015-09-22 DIAGNOSIS — I5032 Chronic diastolic (congestive) heart failure: Secondary | ICD-10-CM | POA: Diagnosis not present

## 2015-09-22 DIAGNOSIS — Z7952 Long term (current) use of systemic steroids: Secondary | ICD-10-CM | POA: Diagnosis not present

## 2015-09-22 DIAGNOSIS — K219 Gastro-esophageal reflux disease without esophagitis: Secondary | ICD-10-CM | POA: Diagnosis not present

## 2015-09-22 DIAGNOSIS — J90 Pleural effusion, not elsewhere classified: Secondary | ICD-10-CM | POA: Diagnosis not present

## 2015-09-22 DIAGNOSIS — E079 Disorder of thyroid, unspecified: Secondary | ICD-10-CM | POA: Diagnosis not present

## 2015-09-22 DIAGNOSIS — D696 Thrombocytopenia, unspecified: Secondary | ICD-10-CM | POA: Diagnosis not present

## 2015-09-22 DIAGNOSIS — J9601 Acute respiratory failure with hypoxia: Secondary | ICD-10-CM | POA: Diagnosis not present

## 2015-09-22 DIAGNOSIS — R0600 Dyspnea, unspecified: Secondary | ICD-10-CM

## 2015-09-22 DIAGNOSIS — Z86711 Personal history of pulmonary embolism: Secondary | ICD-10-CM | POA: Diagnosis not present

## 2015-09-22 DIAGNOSIS — Z880 Allergy status to penicillin: Secondary | ICD-10-CM | POA: Diagnosis not present

## 2015-09-22 DIAGNOSIS — M329 Systemic lupus erythematosus, unspecified: Secondary | ICD-10-CM | POA: Diagnosis not present

## 2015-09-22 DIAGNOSIS — J948 Other specified pleural conditions: Secondary | ICD-10-CM | POA: Diagnosis not present

## 2015-09-22 DIAGNOSIS — E876 Hypokalemia: Secondary | ICD-10-CM | POA: Diagnosis not present

## 2015-09-22 DIAGNOSIS — E785 Hyperlipidemia, unspecified: Secondary | ICD-10-CM | POA: Diagnosis not present

## 2015-09-22 DIAGNOSIS — Z66 Do not resuscitate: Secondary | ICD-10-CM | POA: Diagnosis not present

## 2015-09-22 LAB — ECHOCARDIOGRAM COMPLETE
Height: 62 in
Weight: 2384 oz

## 2015-09-22 LAB — BODY FLUID CELL COUNT WITH DIFFERENTIAL
Eos, Fluid: 0 %
Lymphs, Fluid: 66 %
Monocyte-Macrophage-Serous Fluid: 33 % — ABNORMAL LOW (ref 50–90)
Neutrophil Count, Fluid: 1 % (ref 0–25)
Total Nucleated Cell Count, Fluid: 344 cu mm (ref 0–1000)

## 2015-09-22 LAB — T4, FREE: Free T4: 1.18 ng/dL — ABNORMAL HIGH (ref 0.61–1.12)

## 2015-09-22 LAB — CBC
HCT: 41.1 % (ref 36.0–46.0)
Hemoglobin: 13.1 g/dL (ref 12.0–15.0)
MCH: 28.2 pg (ref 26.0–34.0)
MCHC: 31.9 g/dL (ref 30.0–36.0)
MCV: 88.4 fL (ref 78.0–100.0)
Platelets: 95 10*3/uL — ABNORMAL LOW (ref 150–400)
RBC: 4.65 MIL/uL (ref 3.87–5.11)
RDW: 14.9 % (ref 11.5–15.5)
WBC: 7.1 10*3/uL (ref 4.0–10.5)

## 2015-09-22 LAB — GRAM STAIN

## 2015-09-22 LAB — BASIC METABOLIC PANEL
Anion gap: 10 (ref 5–15)
BUN: 8 mg/dL (ref 6–20)
CO2: 26 mmol/L (ref 22–32)
Calcium: 8.2 mg/dL — ABNORMAL LOW (ref 8.9–10.3)
Chloride: 108 mmol/L (ref 101–111)
Creatinine, Ser: 0.77 mg/dL (ref 0.44–1.00)
GFR calc Af Amer: >60 mL/min (ref >60)
GFR calc non Af Amer: >60 mL/min (ref >60)
Glucose, Bld: 110 mg/dL — ABNORMAL HIGH (ref 65–99)
Potassium: 4 mmol/L (ref 3.5–5.1)
Sodium: 144 mmol/L (ref 135–145)

## 2015-09-22 LAB — LACTATE DEHYDROGENASE, PLEURAL OR PERITONEAL FLUID: LD, Fluid: 71 U/L — ABNORMAL HIGH (ref 3–23)

## 2015-09-22 LAB — PROTIME-INR
INR: 0.93 (ref 0.00–1.49)
Prothrombin Time: 12.7 seconds (ref 11.6–15.2)

## 2015-09-22 LAB — GLUCOSE, SEROUS FLUID: Glucose, Fluid: 112 mg/dL

## 2015-09-22 LAB — PROTEIN, BODY FLUID: Total protein, fluid: 3.6 g/dL

## 2015-09-22 LAB — TSH: TSH: 1.227 u[IU]/mL (ref 0.350–4.500)

## 2015-09-22 MED ORDER — LIDOCAINE HCL (PF) 1 % IJ SOLN
INTRAMUSCULAR | Status: AC
  Filled 2015-09-22: qty 10

## 2015-09-22 MED ORDER — METOPROLOL TARTRATE 1 MG/ML IV SOLN
5.0000 mg | Freq: Once | INTRAVENOUS | Status: AC
  Administered 2015-09-22: 5 mg via INTRAVENOUS
  Filled 2015-09-22: qty 5

## 2015-09-22 MED ORDER — SODIUM CHLORIDE 0.9 % IV BOLUS (SEPSIS)
500.0000 mL | Freq: Once | INTRAVENOUS | Status: AC
  Administered 2015-09-22: 500 mL via INTRAVENOUS

## 2015-09-22 NOTE — Progress Notes (Signed)
  Echocardiogram 2D Echocardiogram has been performed.  Diamond Nickel 09/22/2015, 3:21 PM

## 2015-09-22 NOTE — Care Management Obs Status (Signed)
[place] NOTIFICATION   Patient Details  Name: Jane Chavez MRN: [id number] Date of Birth: 1945/06/26   Medicare Observation Status Notification Given:  Yes    Sharin Mons, RN 09/22/2015, 2:23 PM

## 2015-09-22 NOTE — Procedures (Signed)
Successful US guided left thoracentesis. Yielded 1.08 liters of clear yellow fluid. Pt tolerated procedure well. No immediate complications.  Specimen was sent for labs. CXR ordered.  Judie Grieve Yehonatan Grandison PA-C 09/22/2015 12:54 PM

## 2015-09-22 NOTE — Progress Notes (Signed)
[facility]                                                                              Patient Demographics  Jane Chavez, is a 70 y.o. female, DOB - [birth year], [id number]  Admit date - 09/21/2015   Admitting Physician Elwin Mocha, MD  Outpatient Primary MD for the patient is No PCP Per Patient  LOS - 1  days    Chief Complaint  Patient presents with  . Chest Pain       Brief HPI    HPI: Jane Chavez is a 70 y.o. Female with history of lupus hyperlipidemia and GERD presented with complaint of shortness of breath for the past 2-3 weeks, Associated with chest tightness, dyspnea on exertion and orthopnea. She also reported productive cough with clear mucus but no fevers or chills. No peripheral edema or leg swelling, weight gain No recent upper respiratory tract infections. Patient denies any recent fever cough nausea vomiting hemoptysis dizziness weakness. Patient has history significant for lupus. Was on chronic prednisone at 5 mg until recently, stopped in the last year.   Assessment & Plan    Pleural effusion, presenting with acute hypoxic respiratory failure -Bilateral, no pulmonary embolism or pneumonia on CT scan - differential diagnosis: Empyema, CHF, inflammatory fluid collection related to lupus, BNP 35 - Continue Lasix, ultrasound-guided thoracentesis done today, 1.08 L removed, follow studies - Follow 2-D echo  Hypokalemia - Resolved  GERD -Continue PPI   History of lupus - Patient is not on any medication  Thrombocytopenia - Follow closely   Code Status: DO NOT RESUSCITATE  Family Communication: Discussed in detail with the patient, all imaging results, lab results explained to the patient   Disposition Plan hopefully next 24-48 hours  Time Spent in minutes   25 minutes  Procedures  US thoracentesis  Consults   IR   DVT Prophylaxis SCD's  Medications  Scheduled Meds: . atorvastatin  20 mg Oral Daily  .  furosemide  40 mg Intravenous Q12H  . lidocaine (PF)      . pantoprazole  40 mg Oral Daily  . potassium chloride  40 mEq Oral BID   Continuous Infusions:  PRN Meds:.acetaminophen **OR** acetaminophen, albuterol, guaiFENesin-dextromethorphan, HYDROcodone-acetaminophen, ondansetron **OR** ondansetron (ZOFRAN) IV, senna-docusate   Antibiotics   Anti-infectives    None        Subjective:   Jane Chavez was seen and examined today. Feeling somewhat better today. Patient denies dizziness, chest pain,  abdominal pain, N/V/D/C, new weakness, numbess, tingling. No acute events overnight.    Objective:   Filed Vitals:   09/21/15 2028 09/22/15 0701 09/22/15 1100 09/22/15 1115  BP: 117/78 96/67 105/69 102/74  Pulse: 96 108  106  Temp: 98.4 F (36.9 C) 98.1 F (36.7 C)    TempSrc: Oral Oral    Resp: 20 20    Height:      Weight:  67.586 kg (149 lb)    SpO2: 98% 95% 95% 95%    Intake/Output Summary (Last 24 hours) at 09/22/15 1300 Last data filed at 09/22/15 0942  Gross per 24 hour  Intake 2584.17 ml  Output      0 ml  Net 2584.17 ml     Wt Readings from Last 3 Encounters:  09/22/15 67.586 kg (149 lb)  09/06/15 71.016 kg (156 lb 9 oz)     Exam  General: Alert and oriented x 3, NAD  HEENT:  PERRLA, EOMI, Anicteric Sclera, mucous membranes moist.   Neck: Supple, no JVD  CVS: S1 S2 auscultated, no rubs, murmurs or gallops. Regular rate and rhythm.  Respiratory: Decreased breath sounds at the bases  Abdomen: Soft, nontender, nondistended, + bowel sounds  Ext: no cyanosis clubbing or edema  Neuro: AAOx3, Cr N's II- XII. Strength 5/5 upper and lower extremities bilaterally  Skin: No rashes  Psych: Normal affect and demeanor, alert and oriented x3    Data Reviewed:  I have personally reviewed following labs and imaging studies  Micro Results No results found for this or any previous visit (from the past 240 hour(s)).  Radiology Reports Dg Chest 1  View  09/22/2015  CLINICAL DATA:  Status post left-sided thoracentesis EXAM: CHEST 1 VIEW COMPARISON:  Study obtained earlier in the day. FINDINGS: Left effusion is smaller following thoracentesis. No pneumothorax. Pleural effusions remain bilaterally, now larger on the right than on the left. There is patchy atelectasis in each lung base. Heart size and pulmonary vascular normal. No adenopathy. No bone lesions. IMPRESSION: Left-sided pleural effusion is much smaller following thoracentesis. Small pleural effusions, larger on the right than on the left, are now present. There is atelectatic change in both lung bases, more on the left on the right. No change in cardiac silhouette. No pneumothorax. Electronically Signed   By: Lowella Grip III M.D.   On: 09/22/2015 11:39   X-ray Chest Pa And Lateral  09/22/2015  CLINICAL DATA:  Bilateral pleural effusions, symptomatic [name] improved. EXAM: CHEST  2 VIEW COMPARISON:  Portable chest x-ray of September 21, 2015 and chest CT scan of same day. FINDINGS: The patient has known moderate bilateral pleural effusions greater on the left than on the right. The volume of these effusions is stable to slightly increased. The cardiac silhouette remains enlarged. The pulmonary vascularity is not engorged. There is no pneumothorax. The mediastinum is normal in width. IMPRESSION: Moderate sized bilateral pleural effusions not greatly changed since yesterday's study. Electronically Signed   By: David  Martinique M.D.   On: 09/22/2015 08:16   Dg Chest 2 View  09/21/2015  CLINICAL DATA:  Acute onset of mid chest pain and cough. Initial encounter. EXAM: CHEST  2 VIEW COMPARISON:  Chest radiograph performed 09/07/2015 FINDINGS: The lungs are well-aerated. Small to moderate bilateral pleural effusions are noted, left greater than right. Mild bibasilar opacities may reflect mild interstitial edema. There is no evidence of pneumothorax. The heart is mildly enlarged. No acute osseous  abnormalities are seen. IMPRESSION: Small to moderate bilateral pleural effusions, left greater than right. Mild bibasilar opacities may reflect mild interstitial edema. Electronically Signed   By: Garald Balding M.D.   On: 09/21/2015 03:57   Dg Chest 2 View  09/07/2015  CLINICAL DATA:  70 year old female with shortness of breath and chest tightness EXAM: CHEST  2 VIEW COMPARISON:  None. FINDINGS: Two views of the chest demonstrate small left pleural effusion with associated compressive atelectasis of the left lung base. Underlying infiltrate or superimposed pneumonia not excluded. Trace right pleural effusion noted. There is mild diffuse increased interstitial prominence which may represent mild congestion. The cardiac silhouette is within normal limits. No acute osseous pathology.  IMPRESSION: Small bilateral pleural effusions, left greater right with subsegmental atelectatic changes of the left lung base. Pneumonia is not excluded. Electronically Signed   By: Anner Crete M.D.   On: 09/07/2015 01:30   Ct Angio Chest Pe W/cm &/or Wo Cm  09/21/2015  CLINICAL DATA:  Shortness of breath. EXAM: CT ANGIOGRAPHY CHEST WITH CONTRAST TECHNIQUE: Multidetector CT imaging of the chest was performed using the standard protocol during bolus administration of intravenous contrast. Multiplanar CT image reconstructions and MIPs were obtained to evaluate the vascular anatomy. CONTRAST:  100 mL of Isovue 370 intravenously. COMPARISON:  Chest radiograph of same day. FINDINGS: No pneumothorax is noted. Large bilateral pleural effusions are noted with adjacent atelectasis of the lower lobes, left greater than right. Diffusely enlarged thyroid gland is noted with possible left thyroid mass. There is no definite evidence of pulmonary embolus. There is no evidence of thoracic aortic dissection or aneurysm. Visualized portion of upper abdomen is unremarkable. No mediastinal adenopathy is noted. No significant osseous abnormality  is noted. Review of the MIP images confirms the above findings. IMPRESSION: No definite evidence of pulmonary embolus. Large bilateral pleural effusions are noted with adjacent atelectasis of the lower lobes, left greater than right. Diffusely enlarged thyroid gland is noted with possible left thyroid mass. Dedicated thyroid ultrasound is recommended on nonemergent basis. Electronically Signed   By: Marijo Conception, M.D.   On: 09/21/2015 10:02    CBC  Recent Labs Lab 09/21/15 0324 09/22/15 0439  WBC 8.3 7.1  HGB 13.8 13.1  HCT 41.8 41.1  PLT 99* 95*  MCV 87.3 88.4  MCH 28.8 28.2  MCHC 33.0 31.9  RDW 14.7 14.9    Chemistries   Recent Labs Lab 09/21/15 0324 09/21/15 0744 09/22/15 0439  NA 142  --  144  K 3.2*  --  4.0  CL 107  --  108  CO2 23  --  26  GLUCOSE 98  --  110*  BUN 15  --  8  CREATININE 0.71  --  0.77  CALCIUM 8.4*  --  8.2*  AST  --  24  --   ALT  --  15  --   ALKPHOS  --  41  --   BILITOT  --  0.7  --    ------------------------------------------------------------------------------------------------------------------ estimated creatinine clearance is 61.5 mL/min (by C-G formula based on Cr of 0.77). ------------------------------------------------------------------------------------------------------------------ No results for input(s): HGBA1C in the last 72 hours. ------------------------------------------------------------------------------------------------------------------ No results for input(s): CHOL, HDL, LDLCALC, TRIG, CHOLHDL, LDLDIRECT in the last 72 hours. ------------------------------------------------------------------------------------------------------------------  Recent Labs  09/22/15 0708  TSH 1.227   ------------------------------------------------------------------------------------------------------------------ No results for input(s): VITAMINB12, FOLATE, FERRITIN, TIBC, IRON, RETICCTPCT in the last 72 hours.  Coagulation  profile  Recent Labs Lab 09/22/15 0439  INR 0.93     Recent Labs  09/21/15 0744  DDIMER 1.85*    Cardiac Enzymes  Recent Labs Lab 09/21/15 0733  TROPONINI <0.03   ------------------------------------------------------------------------------------------------------------------ Invalid input(s): POCBNP  No results for input(s): GLUCAP in the last 72 hours.   RAI,RIPUDEEP M.D. [facility] 09/22/2015, 1:00 PM  Pager: [phone] Between 7am to 7pm - call Pager - [phone]  After 7pm go to [email or web address] - password [email or web address]  Call night coverage person covering after 7pm

## 2015-09-22 NOTE — Progress Notes (Signed)
CCMD notified about patient'sHR being high (136-140). Patient asymptomatic. MD informed and per her order patient received NS 553ml bolus and Lopressor 5mg  IV x 1; also, EKG will be done. Will continue to monitor.

## 2015-09-23 ENCOUNTER — Observation Stay (HOSPITAL_COMMUNITY): Payer: Medicare Other

## 2015-09-23 DIAGNOSIS — M329 Systemic lupus erythematosus, unspecified: Secondary | ICD-10-CM | POA: Diagnosis not present

## 2015-09-23 DIAGNOSIS — K219 Gastro-esophageal reflux disease without esophagitis: Secondary | ICD-10-CM | POA: Diagnosis not present

## 2015-09-23 DIAGNOSIS — J9601 Acute respiratory failure with hypoxia: Secondary | ICD-10-CM | POA: Diagnosis not present

## 2015-09-23 DIAGNOSIS — J90 Pleural effusion, not elsewhere classified: Secondary | ICD-10-CM | POA: Diagnosis not present

## 2015-09-23 DIAGNOSIS — E785 Hyperlipidemia, unspecified: Secondary | ICD-10-CM | POA: Diagnosis not present

## 2015-09-23 DIAGNOSIS — Z66 Do not resuscitate: Secondary | ICD-10-CM | POA: Diagnosis not present

## 2015-09-23 DIAGNOSIS — I5032 Chronic diastolic (congestive) heart failure: Secondary | ICD-10-CM | POA: Diagnosis not present

## 2015-09-23 LAB — BASIC METABOLIC PANEL
Anion gap: 8 (ref 5–15)
BUN: 13 mg/dL (ref 6–20)
CO2: 25 mmol/L (ref 22–32)
Calcium: 8.3 mg/dL — ABNORMAL LOW (ref 8.9–10.3)
Chloride: 111 mmol/L (ref 101–111)
Creatinine, Ser: 0.8 mg/dL (ref 0.44–1.00)
GFR calc Af Amer: >60 mL/min (ref >60)
GFR calc non Af Amer: >60 mL/min (ref >60)
Glucose, Bld: 95 mg/dL (ref 65–99)
Potassium: 4.5 mmol/L (ref 3.5–5.1)
Sodium: 144 mmol/L (ref 135–145)

## 2015-09-23 LAB — CBC
HCT: 40.6 % (ref 36.0–46.0)
Hemoglobin: 13.2 g/dL (ref 12.0–15.0)
MCH: 28.6 pg (ref 26.0–34.0)
MCHC: 32.5 g/dL (ref 30.0–36.0)
MCV: 88.1 fL (ref 78.0–100.0)
Platelets: 76 10*3/uL — ABNORMAL LOW (ref 150–400)
RBC: 4.61 MIL/uL (ref 3.87–5.11)
RDW: 14.8 % (ref 11.5–15.5)
WBC: 9 10*3/uL (ref 4.0–10.5)

## 2015-09-23 LAB — T3: T3, Total: 92 ng/dL (ref 71–180)

## 2015-09-23 MED ORDER — LIDOCAINE HCL (PF) 1 % IJ SOLN
INTRAMUSCULAR | Status: AC
  Filled 2015-09-23: qty 10

## 2015-09-23 NOTE — Progress Notes (Signed)
Patient ID: Jane Chavez, female   DOB: 1946-01-07, 70 y.o.   MRN: [id number]   Pt scheduled for Right sided thoracentesis today No need for labs per MD  Limited Chest US reveals small pleural effusion With lung flap in sindow  Called Dr Tana Coast Discussed with her small effusion and limited window  No thoracentesis performed today Pt sent back to room

## 2015-09-23 NOTE — Progress Notes (Signed)
   09/23/15 1115  Vitals  BP 98/64 mmHg  MAP (mmHg) 70  BP Method Automatic  Pulse Rate (!) 104    Notified Dr Rai of BP and HR on tele fluctuating on tele between 100 and 140. She will consult Cardiology.

## 2015-09-23 NOTE — Progress Notes (Signed)
[facility]                                                                              Patient Demographics  Jane Chavez, is a 70 y.o. female, DOB - 1946-05-30, [id number]  Admit date - 09/21/2015   Admitting Physician Elwin Mocha, MD  Outpatient Primary MD for the patient is No PCP Per Patient  LOS - 2  days    Chief Complaint  Patient presents with  . Chest Pain       Brief HPI    HPI: Jane Chavez is a 70 y.o. Female with history of lupus hyperlipidemia and GERD presented with complaint of shortness of breath for the past 2-3 weeks, Associated with chest tightness, dyspnea on exertion and orthopnea. She also reported productive cough with clear mucus but no fevers or chills. No peripheral edema or leg swelling, weight gain No recent upper respiratory tract infections. Patient denies any recent fever cough nausea vomiting hemoptysis dizziness weakness. Patient has history significant for lupus. Was on chronic prednisone at 5 mg until recently, stopped in the last year.   Assessment & Plan    Pleural effusion, presenting with acute hypoxic respiratory failure, Transudate, likely from grade 2 diastolic CHF -Bilateral, no pulmonary embolism or pneumonia on CT scan - differential diagnosis: Empyema, CHF, inflammatory fluid collection related to lupus, BNP 35 - Continue Lasix, ultrasound-guided thoracentesis done today, 1.08 L removed, follow studies - 2-D echo showed EF of [id number], grade 2 diastolic dysfunction, unfortunately not able to place her on Lasix due to borderline lower BP - Patient went downstairs for left-sided thoracentesis however not enough fluid to tap  Hypokalemia - Resolved  GERD -Continue PPI   History of lupus - Patient is not on any medication, recommended shortly to follow up with her rheumatologist with [facility]  Thrombocytopenia - Follow closely, platelets trending down  Tachycardia - TSH 1.2, T3 normal, T4 mildly  elevated at 1.1. Thyroid ultrasound with nodules, has not changed significantly since 2014 - Sinus tachycardia, nonsustained, unfortunately cannot place on beta blockers due to borderline BP. Discussed with EP cardiology, Dr. Lovena Le, recommended no treatment at this time as patient is asymptomatic  Code Status: DO NOT RESUSCITATE  Family Communication: Discussed in detail with the patient, all imaging results, lab results explained to the patient   Disposition Plan: Discussed with patient in detail, requested to be DC home in a.m.  Time Spent in minutes   25 minutes  Procedures  US thoracentesis  Consults   IR   DVT Prophylaxis SCD's  Medications  Scheduled Meds: . atorvastatin  20 mg Oral Daily  . lidocaine (PF)      . pantoprazole  40 mg Oral Daily  . potassium chloride  40 mEq Oral BID   Continuous Infusions:  PRN Meds:.acetaminophen **OR** acetaminophen, albuterol, guaiFENesin-dextromethorphan, HYDROcodone-acetaminophen, ondansetron **OR** ondansetron (ZOFRAN) IV, senna-docusate   Antibiotics   Anti-infectives    None        Subjective:   Jane Chavez was seen and examined today. Feeling somewhat better today. Patient denies dizziness, chest pain,  abdominal pain, N/V/D/C, new weakness, numbess, tingling. No  acute events overnight.    Objective:   Filed Vitals:   09/22/15 1625 09/22/15 2151 09/23/15 0558 09/23/15 1115  BP: 103/69 103/78 98/65 98/64   Pulse: 116 101 103 104  Temp: 97.9 F (36.6 C) 97.7 F (36.5 C) 99.4 F (37.4 C)   TempSrc: Oral Oral Oral   Resp: 16     Height:      Weight:   67.178 kg (148 lb 1.6 oz)   SpO2: 97% 97% 91%     Intake/Output Summary (Last 24 hours) at 09/23/15 1248 Last data filed at 09/23/15 0659  Gross per 24 hour  Intake    340 ml  Output      0 ml  Net    340 ml     Wt Readings from Last 3 Encounters:  09/23/15 67.178 kg (148 lb 1.6 oz)  09/06/15 71.016 kg (156 lb 9 oz)     Exam  General: Alert and  oriented x 3, NAD  HEENT:   Neck:   CVS: S1 S2 auscultated, no rubs, murmurs or gallops. Regular rate and rhythm.  Respiratory: Decreased breath sounds at the bases  Abdomen: Soft, nontender, nondistended, + bowel sounds  Ext: no cyanosis clubbing or edema  Neuro: No new deficits  Skin: No rashes  Psych: Normal affect and demeanor, alert and oriented x3    Data Reviewed:  I have personally reviewed following labs and imaging studies  Micro Results Recent Results (from the past 240 hour(s))  Gram stain     Status: None   Collection Time: 09/22/15 12:40 PM  Result Value Ref Range Status   Specimen Description FLUID PLEURAL LEFT  Final   Special Requests NONE  Final   Gram Stain   Final    DEGENERATED CELLULAR MATERIAL PRESENT NO WBC SEEN NO ORGANISMS SEEN    Report Status 09/22/2015 FINAL  Final    Radiology Reports Dg Chest 1 View  09/22/2015  CLINICAL DATA:  Status post left-sided thoracentesis EXAM: CHEST 1 VIEW COMPARISON:  Study obtained earlier in the day. FINDINGS: Left effusion is smaller following thoracentesis. No pneumothorax. Pleural effusions remain bilaterally, now larger on the right than on the left. There is patchy atelectasis in each lung base. Heart size and pulmonary vascular normal. No adenopathy. No bone lesions. IMPRESSION: Left-sided pleural effusion is much smaller following thoracentesis. Small pleural effusions, larger on the right than on the left, are now present. There is atelectatic change in both lung bases, more on the left on the right. No change in cardiac silhouette. No pneumothorax. Electronically Signed   By: Lowella Grip III M.D.   On: 09/22/2015 11:39   X-ray Chest Pa And Lateral  09/22/2015  CLINICAL DATA:  Bilateral pleural effusions, symptomatic Jane Chavez improved. EXAM: CHEST  2 VIEW COMPARISON:  Portable chest x-ray of September 21, 2015 and chest CT scan of same day. FINDINGS: The patient has known moderate bilateral pleural effusions  greater on the left than on the right. The volume of these effusions is stable to slightly increased. The cardiac silhouette remains enlarged. The pulmonary vascularity is not engorged. There is no pneumothorax. The mediastinum is normal in width. IMPRESSION: Moderate sized bilateral pleural effusions not greatly changed since yesterday's study. Electronically Signed   By: David  Martinique M.D.   On: 09/22/2015 08:16   Dg Chest 2 View  09/21/2015  CLINICAL DATA:  Acute onset of mid chest pain and cough. Initial encounter. EXAM: CHEST  2 VIEW COMPARISON:  Chest radiograph  performed 09/07/2015 FINDINGS: The lungs are well-aerated. Small to moderate bilateral pleural effusions are noted, left greater than right. Mild bibasilar opacities may reflect mild interstitial edema. There is no evidence of pneumothorax. The heart is mildly enlarged. No acute osseous abnormalities are seen. IMPRESSION: Small to moderate bilateral pleural effusions, left greater than right. Mild bibasilar opacities may reflect mild interstitial edema. Electronically Signed   By: Garald Balding M.D.   On: 09/21/2015 03:57   Dg Chest 2 View  09/07/2015  CLINICAL DATA:  70 year old female with shortness of breath and chest tightness EXAM: CHEST  2 VIEW COMPARISON:  None. FINDINGS: Two views of the chest demonstrate small left pleural effusion with associated compressive atelectasis of the left lung base. Underlying infiltrate or superimposed pneumonia not excluded. Trace right pleural effusion noted. There is mild diffuse increased interstitial prominence which may represent mild congestion. The cardiac silhouette is within normal limits. No acute osseous pathology. IMPRESSION: Small bilateral pleural effusions, left greater right with subsegmental atelectatic changes of the left lung base. Pneumonia is not excluded. Electronically Signed   By: Anner Crete M.D.   On: 09/07/2015 01:30   Ct Angio Chest Pe W/cm &/or Wo Cm  09/21/2015   CLINICAL DATA:  Shortness of breath. EXAM: CT ANGIOGRAPHY CHEST WITH CONTRAST TECHNIQUE: Multidetector CT imaging of the chest was performed using the standard protocol during bolus administration of intravenous contrast. Multiplanar CT image reconstructions and MIPs were obtained to evaluate the vascular anatomy. CONTRAST:  100 mL of Isovue 370 intravenously. COMPARISON:  Chest radiograph of same day. FINDINGS: No pneumothorax is noted. Large bilateral pleural effusions are noted with adjacent atelectasis of the lower lobes, left greater than right. Diffusely enlarged thyroid gland is noted with possible left thyroid mass. There is no definite evidence of pulmonary embolus. There is no evidence of thoracic aortic dissection or aneurysm. Visualized portion of upper abdomen is unremarkable. No mediastinal adenopathy is noted. No significant osseous abnormality is noted. Review of the MIP images confirms the above findings. IMPRESSION: No definite evidence of pulmonary embolus. Large bilateral pleural effusions are noted with adjacent atelectasis of the lower lobes, left greater than right. Diffusely enlarged thyroid gland is noted with possible left thyroid mass. Dedicated thyroid ultrasound is recommended on nonemergent basis. Electronically Signed   By: Marijo Conception, M.D.   On: 09/21/2015 10:02   [place] Chest  09/23/2015  CLINICAL DATA:  70 year old female with history of small right pleural effusion in the right chest. Evaluate for potential thoracentesis. EXAM: CHEST ULTRASOUND COMPARISON:  No priors. FINDINGS: Small right pleural effusion noted, of insufficient volume to safely perform thoracentesis. IMPRESSION: 1. Small right pleural effusion. Electronically Signed   By: Vinnie Langton M.D.   On: 09/23/2015 12:42   US Thyroid  09/22/2015  CLINICAL DATA:  Mass involving the thyroid region. EXAM: THYROID ULTRASOUND TECHNIQUE: Ultrasound examination of the thyroid gland and adjacent soft tissues was  performed. COMPARISON:  02/11/2013 FINDINGS: There is unchanged mild diffuse heterogeneity of thyroid parenchymal echotexture. No definitive new or enlarging thyroid nodules. Right thyroid lobe Measurements: Enlarged measuring 5.7 x 3.1 x 2.5 cm, increased in size in interval, previously, 5.0 x 2.0 x 2.4 cm. Right, mid, medial (labeled #1) - 0.6 x 0.7 x 0.4 cm - mixed echogenic, partially cystic, partially solid - grossly unchanged, previously, 0.6 x 0.4 x 0.6 cm Right, mid (labeled #2) - 0.6 x 0.8 x 0.5 cm - mixed echogenic, partially cystic, predominantly solid - grossly unchanged, previously,  0.7 x 0.6 x 0.7 cm Right, inferior (labeled #3) - 0.4 x 0.5 x 0.3 cm - mixed echogenic, partially cystic, partially solid - grossly unchanged, previously, 0.7 x 0.8 x 0.6 cm Left thyroid lobe Measurements: Enlarged measuring 5.5 x 3.4 x 2.9 cm, increased in size in the interval, previously, 4.8 x 2.1 x 2.9 cm. Left, mid, lateral (labeled #1) - 0.9 x 0.9 x 0.7 cm - mixed echogenic, solid - grossly unchanged, previously, 1.0 x 1.0 x 0.7 cm Left, mid (labeled #2) - 1.0 x 1.2 x 1.0 cm- anechoic and cystic with minimal eccentric septation/wall thickening - grossly unchanged, previously, 2.9 x 0.6 x 0.8 cm with minimal interval cystic degeneration. Left, inferior (labeled #3) - 0.5 x 0.7 x 0.6 cm-mixed echogenic, partially cystic, partially solid - grossly unchanged, previously, 0.5 x 0.4 x 0.4 cm Isthmus Thickness: Enlarged measuring 0.8 cm in diameter, unchanged. Mid - 0.7 x 1.0 x 0.6 cm - mixed echogenic, solid - decreased in size in interval, previously, 1.6 x 0.7 x 0.7 cm Lymphadenopathy None visualized. IMPRESSION: Mild interval apparent enlargement of the thyroid gland without development of new or enlarging thyroid nodules. All of the discretely measured thyroid nodules appear grossly unchanged since the 02/2013 examination. Electronically Signed   By: Sandi Mariscal M.D.   On: 09/22/2015 14:34   US Thoracentesis Asp  Pleural Space W/img Guide  09/22/2015  INDICATION: Shortness of breath. Bilateral pleural effusions. Request for ultrasound guided diagnostic and therapeutic thoracentesis. EXAM: ULTRASOUND GUIDED LEFT THORACENTESIS MEDICATIONS: 1% Lidocaine. COMPLICATIONS: None immediate. PROCEDURE: An ultrasound guided thoracentesis was thoroughly discussed with the patient and questions answered. The benefits, risks, alternatives and complications were also discussed. The patient understands and wishes to proceed with the procedure. Written consent was obtained. Ultrasound was performed to localize and mark an adequate pocket of fluid in the left chest. The area was then prepped and draped in the normal sterile fashion. 1% Lidocaine was used for local anesthesia. Under ultrasound guidance a 6 Fr Safe-T-Centesis catheter was introduced. Thoracentesis was performed. The catheter was removed and a dressing applied. FINDINGS: A total of approximately 1.08 liters of clear yellow fluid was removed. Samples were sent to the laboratory as requested by the clinical team. IMPRESSION: Successful ultrasound guided left thoracentesis yielding 1.08 liters of pleural fluid. Read by:  Gareth Eagle, PA-C Electronically Signed   By: Corrie Mckusick D.O.   On: 09/22/2015 12:52    CBC  Recent Labs Lab 09/21/15 0324 09/22/15 0439 09/23/15 0600  WBC 8.3 7.1 9.0  HGB 13.8 13.1 13.2  HCT 41.8 41.1 40.6  PLT 99* 95* 76*  MCV 87.3 88.4 88.1  MCH 28.8 28.2 28.6  MCHC 33.0 31.9 32.5  RDW 14.7 14.9 14.8    Chemistries   Recent Labs Lab 09/21/15 0324 09/21/15 0744 09/22/15 0439 09/23/15 0600  NA 142  --  144 144  K 3.2*  --  4.0 4.5  CL 107  --  108 111  CO2 23  --  26 25  GLUCOSE 98  --  110* 95  BUN 15  --  8 13  CREATININE 0.71  --  0.77 0.80  CALCIUM 8.4*  --  8.2* 8.3*  AST  --  24  --   --   ALT  --  15  --   --   ALKPHOS  --  41  --   --   BILITOT  --  0.7  --   --     ------------------------------------------------------------------------------------------------------------------  estimated creatinine clearance is 61.3 mL/min (by C-G formula based on Cr of 0.8). ------------------------------------------------------------------------------------------------------------------ No results for input(s): HGBA1C in the last 72 hours. ------------------------------------------------------------------------------------------------------------------ No results for input(s): CHOL, HDL, LDLCALC, TRIG, CHOLHDL, LDLDIRECT in the last 72 hours. ------------------------------------------------------------------------------------------------------------------  Recent Labs  09/22/15 0708  TSH 1.227   ------------------------------------------------------------------------------------------------------------------ No results for input(s): VITAMINB12, FOLATE, FERRITIN, TIBC, IRON, RETICCTPCT in the last 72 hours.  Coagulation profile  Recent Labs Lab 09/22/15 0439  INR 0.93     Recent Labs  09/21/15 0744  DDIMER 1.85*    Cardiac Enzymes  Recent Labs Lab 09/21/15 0733  TROPONINI <0.03   ------------------------------------------------------------------------------------------------------------------ Invalid input(s): POCBNP  No results for input(s): GLUCAP in the last 72 hours.   Dinia Joynt M.D. [facility] 09/23/2015, 12:48 PM  Pager: [phone] Between 7am to 7pm - call Pager - [phone]  After 7pm go to [email or web address] - password [email or web address]  Call night coverage person covering after 7pm

## 2015-09-24 DIAGNOSIS — J90 Pleural effusion, not elsewhere classified: Secondary | ICD-10-CM | POA: Diagnosis not present

## 2015-09-24 DIAGNOSIS — K219 Gastro-esophageal reflux disease without esophagitis: Secondary | ICD-10-CM | POA: Diagnosis not present

## 2015-09-24 DIAGNOSIS — Z66 Do not resuscitate: Secondary | ICD-10-CM | POA: Diagnosis not present

## 2015-09-24 DIAGNOSIS — E785 Hyperlipidemia, unspecified: Secondary | ICD-10-CM | POA: Diagnosis not present

## 2015-09-24 DIAGNOSIS — M329 Systemic lupus erythematosus, unspecified: Secondary | ICD-10-CM | POA: Diagnosis not present

## 2015-09-24 DIAGNOSIS — I5032 Chronic diastolic (congestive) heart failure: Secondary | ICD-10-CM | POA: Diagnosis not present

## 2015-09-24 DIAGNOSIS — J9601 Acute respiratory failure with hypoxia: Secondary | ICD-10-CM | POA: Diagnosis not present

## 2015-09-24 LAB — BASIC METABOLIC PANEL
Anion gap: 7 (ref 5–15)
BUN: 13 mg/dL (ref 6–20)
CO2: 23 mmol/L (ref 22–32)
Calcium: 8.4 mg/dL — ABNORMAL LOW (ref 8.9–10.3)
Chloride: 110 mmol/L (ref 101–111)
Creatinine, Ser: 0.71 mg/dL (ref 0.44–1.00)
GFR calc Af Amer: >60 mL/min (ref >60)
GFR calc non Af Amer: >60 mL/min (ref >60)
Glucose, Bld: 96 mg/dL (ref 65–99)
Potassium: 4.4 mmol/L (ref 3.5–5.1)
Sodium: 140 mmol/L (ref 135–145)

## 2015-09-24 LAB — CBC
HCT: 39.4 % (ref 36.0–46.0)
Hemoglobin: 12.9 g/dL (ref 12.0–15.0)
MCH: 29.2 pg (ref 26.0–34.0)
MCHC: 32.7 g/dL (ref 30.0–36.0)
MCV: 89.1 fL (ref 78.0–100.0)
Platelets: 70 10*3/uL — ABNORMAL LOW (ref 150–400)
RBC: 4.42 MIL/uL (ref 3.87–5.11)
RDW: 14.8 % (ref 11.5–15.5)
WBC: 8.1 10*3/uL (ref 4.0–10.5)

## 2015-09-24 MED ORDER — GUAIFENESIN-DM 100-10 MG/5ML PO SYRP: 5.0000 mL | ORAL_SOLUTION | ORAL | Status: DC | PRN

## 2015-09-24 NOTE — Discharge Summary (Signed)
Physician Discharge Summary   Patient ID: Jane Chavez MRN: [id number] DOB/AGE: 09-05-1945 70 y.o.  Admit date: 09/21/2015 Discharge date: 09/24/2015  Primary Care Physician:  [name], MD  Discharge Diagnoses:    . Bilateral Pleural effusion status post thoracentesis  . GERD (gastroesophageal reflux disease)    Grade 2 diastolic CHF     Hypokalemia     History of lupus, not on any medication    Chronic thrombocytopenia    Sinus tachycardia  Consults: Interventional radiology for thoracentesis  Recommendations for Outpatient Follow-up:  1. Please repeat CBC/BMET at next visit 2. Patient was admitted strongly to follow up with her outpatient rheumatologist for lupus treatment   DIET: Heart healthy diet    Allergies:   Allergies  Allergen Reactions  . Penicillins Shortness Of Breath    Has patient had a PCN reaction causing immediate rash, facial/tongue/throat swelling, SOB or lightheadedness with hypotension: Yes Has patient had a PCN reaction causing severe rash involving mucus membranes or skin necrosis: No Has patient had a PCN reaction that required hospitalization No Has patient had a PCN reaction occurring within the last 10 years: No If all of the above answers are "NO", then may proceed with Cephalosporin use.      DISCHARGE MEDICATIONS: Current Discharge Medication List    START taking these medications   Details  guaiFENesin-dextromethorphan (ROBITUSSIN DM) 100-10 MG/5ML syrup Take 5 mLs by mouth every 4 (four) hours as needed for cough (over the counter). Qty: 118 mL, Refills: 0      CONTINUE these medications which have NOT CHANGED   Details  atorvastatin (LIPITOR) 20 MG tablet Take 20 mg by mouth daily.    omeprazole (PRILOSEC) 20 MG capsule Take 20 mg by mouth daily.         Brief H and P: For complete details please refer to admission H and P, but in briefLinda Chavez is a 70 y.o. Female with history of lupus hyperlipidemia and  GERD presented with complaint of shortness of breath for the past 2-3 weeks, Associated with chest tightness, dyspnea on exertion and orthopnea. She also reported productive cough with clear mucus but no fevers or chills. No peripheral edema or leg swelling, weight gain No recent upper respiratory tract infections. Patient denies any recent fever cough nausea vomiting hemoptysis dizziness weakness. Patient has history significant for lupus. Was on chronic prednisone at 5 mg until recently, she stopped in the last year.  Hospital Course:   Bilateral Pleural effusion, presenting with acute hypoxic respiratory failure, Transudate, likely from grade 2 diastolic CHF, also possibly from untreated lupus -Bilateral, no pulmonary embolism or pneumonia on CT scan - differential diagnosis: Empyema, CHF, inflammatory fluid collection related to lupus, BNP 35 - Patient was placed on Lasix for diuresis however she cannot tolerate diuretics due to borderline BP  - IR was consulted and patient underwent ultrasound-guided thoracentesis on the right, 1.08 L removed, studies consistent with transudative pleural effusion, cultures negative so far - 2-D echo showed EF of [id number], grade 2 diastolic dysfunction, unfortunately not able to place her on Lasix due to borderline lower BP - Also attempted left-sided thoracentesis however not enough fluid to tap.    Hypokalemia - Resolved  GERD -Continue PPI   History of lupus - Patient is not on any medication, recommended to follow up with her rheumatologist with [facility]. She was on prednisone and stopped on her own a year ago. Also possibility of inflammatory pleural fluid collection related to lupus.  Thrombocytopenia -  Follow closely, platelets have been trending down, may be possibly due to untreated lupus. Please check CBC, outpatient referral to hematology if no significant improvement or trending down.  Tachycardia-improved - TSH 1.2, T3 normal, T4 mildly elevated  at 1.1. Thyroid ultrasound with nodules, has not changed significantly since 2014 - Sinus tachycardia, nonsustained, unfortunately cannot place on beta blockers due to borderline BP. Discussed with EP cardiology, Dr. Lovena Le, recommended no treatment at this time as patient is asymptomatic    Day of Discharge BP 102/63 mmHg  Pulse 87  Temp(Src) 98.3 F (36.8 C) (Oral)  Resp 16  Ht 5\' 2"  (1.575 m)  Wt 68.085 kg (150 lb 1.6 oz)  BMI 27.45 kg/m2  SpO2 95%  Physical Exam: General: Alert and awake oriented x3 not in any acute distress. HEENT: anicteric sclera, pupils reactive to light and accommodation CVS: S1-S2 clear no murmur rubs or gallops Chest: clear to auscultation bilaterally, no wheezing rales or rhonchi Abdomen: soft nontender, nondistended, normal bowel sounds Extremities: no cyanosis, clubbing or edema noted bilaterally Neuro: Cranial nerves II-XII intact, no focal neurological deficits   The results of significant diagnostics from this hospitalization (including imaging, microbiology, ancillary and laboratory) are listed below for reference.    LAB RESULTS: Basic Metabolic Panel:  Recent Labs Lab 09/23/15 0600 09/24/15 0636  NA 144 140  K 4.5 4.4  CL 111 110  CO2 25 23  GLUCOSE 95 96  BUN 13 13  CREATININE 0.80 0.71  CALCIUM 8.3* 8.4*   Liver Function Tests:  Recent Labs Lab 09/21/15 0744  AST 24  ALT 15  ALKPHOS 41  BILITOT 0.7  PROT 6.6  ALBUMIN 2.4*   No results for input(s): LIPASE, AMYLASE in the last 168 hours. No results for input(s): AMMONIA in the last 168 hours. CBC:  Recent Labs Lab 09/23/15 0600 09/24/15 0636  WBC 9.0 8.1  HGB 13.2 12.9  HCT 40.6 39.4  MCV 88.1 89.1  PLT 76* 70*   Cardiac Enzymes:  Recent Labs Lab 09/21/15 0733  TROPONINI <0.03   BNP: Invalid input(s): POCBNP CBG: No results for input(s): GLUCAP in the last 168 hours.  Significant Diagnostic Studies:  Dg Chest 1 View  09/22/2015  CLINICAL DATA:   Status post left-sided thoracentesis EXAM: CHEST 1 VIEW COMPARISON:  Study obtained earlier in the day. FINDINGS: Left effusion is smaller following thoracentesis. No pneumothorax. Pleural effusions remain bilaterally, now larger on the right than on the left. There is patchy atelectasis in each lung base. Heart size and pulmonary vascular normal. No adenopathy. No bone lesions. IMPRESSION: Left-sided pleural effusion is much smaller following thoracentesis. Small pleural effusions, larger on the right than on the left, are now present. There is atelectatic change in both lung bases, more on the left on the right. No change in cardiac silhouette. No pneumothorax. Electronically Signed   By: Lowella Grip III M.D.   On: 09/22/2015 11:39   X-ray Chest Pa And Lateral  09/22/2015  CLINICAL DATA:  Bilateral pleural effusions, symptomatic [name] improved. EXAM: CHEST  2 VIEW COMPARISON:  Portable chest x-ray of September 21, 2015 and chest CT scan of same day. FINDINGS: The patient has known moderate bilateral pleural effusions greater on the left than on the right. The volume of these effusions is stable to slightly increased. The cardiac silhouette remains enlarged. The pulmonary vascularity is not engorged. There is no pneumothorax. The mediastinum is normal in width. IMPRESSION: Moderate sized bilateral pleural effusions not greatly changed since  yesterday's study. Electronically Signed   By: David  Martinique M.D.   On: 09/22/2015 08:16   Dg Chest 2 View  09/21/2015  CLINICAL DATA:  Acute onset of mid chest pain and cough. Initial encounter. EXAM: CHEST  2 VIEW COMPARISON:  Chest radiograph performed 09/07/2015 FINDINGS: The lungs are well-aerated. Small to moderate bilateral pleural effusions are noted, left greater than right. Mild bibasilar opacities may reflect mild interstitial edema. There is no evidence of pneumothorax. The heart is mildly enlarged. No acute osseous abnormalities are seen. IMPRESSION: Small to  moderate bilateral pleural effusions, left greater than right. Mild bibasilar opacities may reflect mild interstitial edema. Electronically Signed   By: Garald Balding M.D.   On: 09/21/2015 03:57   Ct Angio Chest Pe W/cm &/or Wo Cm  09/21/2015  CLINICAL DATA:  Shortness of breath. EXAM: CT ANGIOGRAPHY CHEST WITH CONTRAST TECHNIQUE: Multidetector CT imaging of the chest was performed using the standard protocol during bolus administration of intravenous contrast. Multiplanar CT image reconstructions and MIPs were obtained to evaluate the vascular anatomy. CONTRAST:  100 mL of Isovue 370 intravenously. COMPARISON:  Chest radiograph of same day. FINDINGS: No pneumothorax is noted. Large bilateral pleural effusions are noted with adjacent atelectasis of the lower lobes, left greater than right. Diffusely enlarged thyroid gland is noted with possible left thyroid mass. There is no definite evidence of pulmonary embolus. There is no evidence of thoracic aortic dissection or aneurysm. Visualized portion of upper abdomen is unremarkable. No mediastinal adenopathy is noted. No significant osseous abnormality is noted. Review of the MIP images confirms the above findings. IMPRESSION: No definite evidence of pulmonary embolus. Large bilateral pleural effusions are noted with adjacent atelectasis of the lower lobes, left greater than right. Diffusely enlarged thyroid gland is noted with possible left thyroid mass. Dedicated thyroid ultrasound is recommended on nonemergent basis. Electronically Signed   By: Marijo Conception, M.D.   On: 09/21/2015 10:02   US Thyroid  09/22/2015  CLINICAL DATA:  Mass involving the thyroid region. EXAM: THYROID ULTRASOUND TECHNIQUE: Ultrasound examination of the thyroid gland and adjacent soft tissues was performed. COMPARISON:  02/11/2013 FINDINGS: There is unchanged mild diffuse heterogeneity of thyroid parenchymal echotexture. No definitive new or enlarging thyroid nodules. Right thyroid  lobe Measurements: Enlarged measuring 5.7 x 3.1 x 2.5 cm, increased in size in interval, previously, 5.0 x 2.0 x 2.4 cm. Right, mid, medial (labeled #1) - 0.6 x 0.7 x 0.4 cm - mixed echogenic, partially cystic, partially solid - grossly unchanged, previously, 0.6 x 0.4 x 0.6 cm Right, mid (labeled #2) - 0.6 x 0.8 x 0.5 cm - mixed echogenic, partially cystic, predominantly solid - grossly unchanged, previously, 0.7 x 0.6 x 0.7 cm Right, inferior (labeled #3) - 0.4 x 0.5 x 0.3 cm - mixed echogenic, partially cystic, partially solid - grossly unchanged, previously, 0.7 x 0.8 x 0.6 cm Left thyroid lobe Measurements: Enlarged measuring 5.5 x 3.4 x 2.9 cm, increased in size in the interval, previously, 4.8 x 2.1 x 2.9 cm. Left, mid, lateral (labeled #1) - 0.9 x 0.9 x 0.7 cm - mixed echogenic, solid - grossly unchanged, previously, 1.0 x 1.0 x 0.7 cm Left, mid (labeled #2) - 1.0 x 1.2 x 1.0 cm- anechoic and cystic with minimal eccentric septation/wall thickening - grossly unchanged, previously, 2.9 x 0.6 x 0.8 cm with minimal interval cystic degeneration. Left, inferior (labeled #3) - 0.5 x 0.7 x 0.6 cm-mixed echogenic, partially cystic, partially solid - grossly unchanged, previously,  0.5 x 0.4 x 0.4 cm Isthmus Thickness: Enlarged measuring 0.8 cm in diameter, unchanged. Mid - 0.7 x 1.0 x 0.6 cm - mixed echogenic, solid - decreased in size in interval, previously, 1.6 x 0.7 x 0.7 cm Lymphadenopathy None visualized. IMPRESSION: Mild interval apparent enlargement of the thyroid gland without development of new or enlarging thyroid nodules. All of the discretely measured thyroid nodules appear grossly unchanged since the 02/2013 examination. Electronically Signed   By: Sandi Mariscal M.D.   On: 09/22/2015 14:34   US Thoracentesis Asp Pleural Space W/img Guide  09/22/2015  INDICATION: Shortness of breath. Bilateral pleural effusions. Request for ultrasound guided diagnostic and therapeutic thoracentesis. EXAM: ULTRASOUND  GUIDED LEFT THORACENTESIS MEDICATIONS: 1% Lidocaine. COMPLICATIONS: None immediate. PROCEDURE: An ultrasound guided thoracentesis was thoroughly discussed with the patient and questions answered. The benefits, risks, alternatives and complications were also discussed. The patient understands and wishes to proceed with the procedure. Written consent was obtained. Ultrasound was performed to localize and mark an adequate pocket of fluid in the left chest. The area was then prepped and draped in the normal sterile fashion. 1% Lidocaine was used for local anesthesia. Under ultrasound guidance a 6 Fr Safe-T-Centesis catheter was introduced. Thoracentesis was performed. The catheter was removed and a dressing applied. FINDINGS: A total of approximately 1.08 liters of clear yellow fluid was removed. Samples were sent to the laboratory as requested by the clinical team. IMPRESSION: Successful ultrasound guided left thoracentesis yielding 1.08 liters of pleural fluid. Read by:  Gareth Eagle, PA-C Electronically Signed   By: Corrie Mckusick D.O.   On: 09/22/2015 12:52    2D ECHO: Study Conclusions  - Left ventricle: The cavity size was normal. Wall thickness was  increased in a pattern of moderate LVH. Systolic function was  vigorous. The estimated ejection fraction was in the range of 65%  to 70%. Wall motion was normal; there were no regional wall  motion abnormalities. Features are consistent with a pseudonormal  left ventricular filling pattern, with concomitant abnormal  relaxation and increased filling pressure (grade 2 diastolic  dysfunction).   Disposition and Follow-up: Discharge Instructions    Diet - low sodium heart healthy    Complete by:  As directed      Increase activity slowly    Complete by:  As directed             DISPOSITION: home    DISCHARGE FOLLOW-UP Follow-up Information    Follow up with [facility], MD. Schedule an appointment as soon as possible for a  visit in 10 days.   Specialty:  Internal Medicine   Why:  for hospital follow-up   Contact information:   [address] [place] [address] [phone]        Time spent on Discharge: 69mins   Signed:   RAI,RIPUDEEP M.D. [facility] 09/24/2015, 10:04 AM Pager: [phone]

## 2015-09-24 NOTE — Progress Notes (Signed)
NURSING PROGRESS NOTE  Jane Chavez [id number] Discharge Data: 09/24/2015 10:37 AM Attending Provider: Mendel Corning, MD [id number], MD   [name] to be D/C'd Home per MD order.    All IV's will be discontinued and monitored for bleeding.  All belongings will be returned to patient for patient to take home.  Last Documented Vital Signs:  Blood pressure 102/63, pulse 87, temperature 98.3 F (36.8 C), temperature source Oral, resp. rate 16, height 5\' 2"  (1.575 m), weight 68.085 kg (150 lb 1.6 oz), SpO2 95 %.  Joslyn Hy, MSN, RN, [employer]

## 2015-09-26 LAB — PH, BODY FLUID: pH, Body Fluid: 7.9

## 2015-09-27 LAB — CULTURE, BODY FLUID-BOTTLE: Culture: NO GROWTH

## 2015-10-03 ENCOUNTER — Observation Stay (HOSPITAL_COMMUNITY): Payer: Medicare Other

## 2015-10-03 ENCOUNTER — Emergency Department (HOSPITAL_COMMUNITY): Payer: Medicare Other

## 2015-10-03 ENCOUNTER — Observation Stay (HOSPITAL_COMMUNITY)
Admission: EM | Admit: 2015-10-03 | Discharge: 2015-10-05 | Disposition: A | Discharge status: Home / Self Care | Payer: Medicare Other | Attending: Family Medicine | Admitting: Family Medicine

## 2015-10-03 ENCOUNTER — Encounter (HOSPITAL_COMMUNITY): Payer: Self-pay | Admitting: Emergency Medicine

## 2015-10-03 DIAGNOSIS — R0602 Shortness of breath: Secondary | ICD-10-CM | POA: Diagnosis not present

## 2015-10-03 DIAGNOSIS — I5032 Chronic diastolic (congestive) heart failure: Secondary | ICD-10-CM | POA: Insufficient documentation

## 2015-10-03 DIAGNOSIS — I519 Heart disease, unspecified: Secondary | ICD-10-CM

## 2015-10-03 DIAGNOSIS — M329 Systemic lupus erythematosus, unspecified: Secondary | ICD-10-CM | POA: Insufficient documentation

## 2015-10-03 DIAGNOSIS — I5189 Other ill-defined heart diseases: Secondary | ICD-10-CM | POA: Diagnosis present

## 2015-10-03 DIAGNOSIS — E785 Hyperlipidemia, unspecified: Secondary | ICD-10-CM | POA: Diagnosis not present

## 2015-10-03 DIAGNOSIS — J90 Pleural effusion, not elsewhere classified: Principal | ICD-10-CM | POA: Insufficient documentation

## 2015-10-03 DIAGNOSIS — Z66 Do not resuscitate: Secondary | ICD-10-CM | POA: Insufficient documentation

## 2015-10-03 DIAGNOSIS — J810 Acute pulmonary edema: Secondary | ICD-10-CM | POA: Diagnosis not present

## 2015-10-03 DIAGNOSIS — Z880 Allergy status to penicillin: Secondary | ICD-10-CM | POA: Diagnosis not present

## 2015-10-03 DIAGNOSIS — E049 Nontoxic goiter, unspecified: Secondary | ICD-10-CM | POA: Insufficient documentation

## 2015-10-03 DIAGNOSIS — J948 Other specified pleural conditions: Secondary | ICD-10-CM | POA: Diagnosis not present

## 2015-10-03 DIAGNOSIS — D696 Thrombocytopenia, unspecified: Secondary | ICD-10-CM | POA: Diagnosis not present

## 2015-10-03 DIAGNOSIS — K219 Gastro-esophageal reflux disease without esophagitis: Secondary | ICD-10-CM | POA: Insufficient documentation

## 2015-10-03 DIAGNOSIS — J9601 Acute respiratory failure with hypoxia: Secondary | ICD-10-CM | POA: Diagnosis not present

## 2015-10-03 LAB — GLUCOSE, SEROUS FLUID: Glucose, Fluid: 124 mg/dL

## 2015-10-03 LAB — CBC WITH DIFFERENTIAL/PLATELET
Basophils Absolute: 0 10*3/uL (ref 0.0–0.1)
Basophils Relative: 0 %
Eosinophils Absolute: 0.4 10*3/uL (ref 0.0–0.7)
Eosinophils Relative: 4 %
HCT: 46 % (ref 36.0–46.0)
Hemoglobin: 15 g/dL (ref 12.0–15.0)
Lymphocytes Relative: 24 %
Lymphs Abs: 2.4 10*3/uL (ref 0.7–4.0)
MCH: 28.4 pg (ref 26.0–34.0)
MCHC: 32.6 g/dL (ref 30.0–36.0)
MCV: 87 fL (ref 78.0–100.0)
Monocytes Absolute: 0.9 10*3/uL (ref 0.1–1.0)
Monocytes Relative: 9 %
Neutro Abs: 6.2 10*3/uL (ref 1.7–7.7)
Neutrophils Relative %: 63 %
Platelets: 161 10*3/uL (ref 150–400)
RBC: 5.29 MIL/uL — ABNORMAL HIGH (ref 3.87–5.11)
RDW: 14.6 % (ref 11.5–15.5)
WBC: 9.9 10*3/uL (ref 4.0–10.5)

## 2015-10-03 LAB — LACTATE DEHYDROGENASE, PLEURAL OR PERITONEAL FLUID: LD, Fluid: 71 U/L — ABNORMAL HIGH (ref 3–23)

## 2015-10-03 LAB — CK: Total CK: 24 U/L — ABNORMAL LOW (ref 38–234)

## 2015-10-03 LAB — I-STAT TROPONIN, ED: Troponin i, poc: 0.02 ng/mL (ref 0.00–0.08)

## 2015-10-03 LAB — BASIC METABOLIC PANEL
Anion gap: 7 (ref 5–15)
BUN: 15 mg/dL (ref 6–20)
CO2: 22 mmol/L (ref 22–32)
Calcium: 8.8 mg/dL — ABNORMAL LOW (ref 8.9–10.3)
Chloride: 111 mmol/L (ref 101–111)
Creatinine, Ser: 0.89 mg/dL (ref 0.44–1.00)
GFR calc Af Amer: >60 mL/min (ref >60)
GFR calc non Af Amer: >60 mL/min (ref >60)
Glucose, Bld: 99 mg/dL (ref 65–99)
Potassium: 3.9 mmol/L (ref 3.5–5.1)
Sodium: 140 mmol/L (ref 135–145)

## 2015-10-03 LAB — APTT: aPTT: 27 seconds (ref 24–37)

## 2015-10-03 LAB — BODY FLUID CELL COUNT WITH DIFFERENTIAL
Eos, Fluid: 0 %
Lymphs, Fluid: 35 %
Monocyte-Macrophage-Serous Fluid: 65 % (ref 50–90)
Neutrophil Count, Fluid: 0 % (ref 0–25)
Total Nucleated Cell Count, Fluid: 837 cu mm (ref 0–1000)

## 2015-10-03 LAB — PROTEIN, BODY FLUID: Total protein, fluid: 3.8 g/dL

## 2015-10-03 LAB — GRAM STAIN

## 2015-10-03 LAB — BRAIN NATRIURETIC PEPTIDE: B Natriuretic Peptide: 20.7 pg/mL (ref 0.0–100.0)

## 2015-10-03 LAB — LACTATE DEHYDROGENASE: LDH: 252 U/L — ABNORMAL HIGH (ref 98–192)

## 2015-10-03 LAB — PROTIME-INR
INR: 0.87 (ref 0.00–1.49)
Prothrombin Time: 12.1 seconds (ref 11.6–15.2)

## 2015-10-03 MED ORDER — FUROSEMIDE 10 MG/ML IJ SOLN: 40.0000 mg | Freq: Once | INTRAMUSCULAR | Status: DC

## 2015-10-03 MED ORDER — SODIUM CHLORIDE 0.9% FLUSH
3.0000 mL | Freq: Two times a day (BID) | INTRAVENOUS | Status: DC
  Administered 2015-10-03 – 2015-10-05 (×4): 3 mL via INTRAVENOUS

## 2015-10-03 MED ORDER — ACETAMINOPHEN 325 MG PO TABS: 650.0000 mg | ORAL_TABLET | Freq: Four times a day (QID) | ORAL | Status: DC | PRN

## 2015-10-03 MED ORDER — ATORVASTATIN CALCIUM 20 MG PO TABS
20.0000 mg | ORAL_TABLET | Freq: Every day | ORAL | Status: DC
  Administered 2015-10-04 – 2015-10-05 (×2): 20 mg via ORAL
  Filled 2015-10-03 (×2): qty 1

## 2015-10-03 MED ORDER — ENOXAPARIN SODIUM 40 MG/0.4ML ~~LOC~~ SOLN
40.0000 mg | SUBCUTANEOUS | Status: DC
  Administered 2015-10-03: 40 mg via SUBCUTANEOUS
  Filled 2015-10-03: qty 0.4

## 2015-10-03 MED ORDER — SODIUM CHLORIDE 0.9% FLUSH: 3.0000 mL | INTRAVENOUS | Status: DC | PRN

## 2015-10-03 MED ORDER — PANTOPRAZOLE SODIUM 40 MG PO TBEC
40.0000 mg | DELAYED_RELEASE_TABLET | Freq: Every day | ORAL | Status: DC
  Administered 2015-10-04 – 2015-10-05 (×2): 40 mg via ORAL
  Filled 2015-10-03 (×2): qty 1

## 2015-10-03 MED ORDER — PREDNISONE 50 MG PO TABS
60.0000 mg | ORAL_TABLET | Freq: Every day | ORAL | Status: DC
  Administered 2015-10-03 – 2015-10-05 (×3): 60 mg via ORAL
  Filled 2015-10-03: qty 3
  Filled 2015-10-03 (×2): qty 1

## 2015-10-03 MED ORDER — SODIUM CHLORIDE 0.9% FLUSH
3.0000 mL | Freq: Two times a day (BID) | INTRAVENOUS | Status: DC
  Administered 2015-10-04 – 2015-10-05 (×2): 3 mL via INTRAVENOUS

## 2015-10-03 MED ORDER — ONDANSETRON HCL 4 MG/2ML IJ SOLN: 4.0000 mg | Freq: Four times a day (QID) | INTRAMUSCULAR | Status: DC | PRN

## 2015-10-03 MED ORDER — SODIUM CHLORIDE 0.9 % IV SOLN: 250.0000 mL | INTRAVENOUS | Status: DC | PRN

## 2015-10-03 MED ORDER — ACETAMINOPHEN 650 MG RE SUPP: 650.0000 mg | Freq: Four times a day (QID) | RECTAL | Status: DC | PRN

## 2015-10-03 MED ORDER — LIDOCAINE HCL (PF) 1 % IJ SOLN
INTRAMUSCULAR | Status: AC
  Filled 2015-10-03: qty 10

## 2015-10-03 MED ORDER — ONDANSETRON HCL 4 MG PO TABS: 4.0000 mg | ORAL_TABLET | Freq: Four times a day (QID) | ORAL | Status: DC | PRN

## 2015-10-03 NOTE — ED Notes (Signed)
EDP and admitting at bedside.

## 2015-10-03 NOTE — Procedures (Signed)
Bilateral pleural effusions, L>R by [place] images Successful US guided left thoracentesis. Yielded 1.45L of clear yellow fluid. Pt tolerated procedure well. No immediate complications.  Specimen was sent for labs. CXR ordered.  Ascencion Dike PA-C 10/03/2015 4:52 PM

## 2015-10-03 NOTE — ED Provider Notes (Signed)
CSN: [id number]     Arrival date & time 10/03/15  [id number] History   First MD Initiated Contact with Patient 10/03/15 1004     Chief Complaint  Patient presents with  . Shortness of Breath     (Consider location/radiation/quality/duration/timing/severity/associated sxs/prior Treatment) HPI Comments: Patient with past medical history remarkable for lupus, GERD, and hyperlipidemia, prior PE 10 years ago, presents to the emergency department with chief complaint of shortness of breath. She was recently admitted on 09/21/15 for shortness of breath and pleural effusions. She states that she had a thoracentesis during the admission. She states that she felt better after being discharged, and was instructed to follow-up with her primary care doctor and her rheumatologist, but she has not been able to see them yet. She has appointments made. She states that she began feeling short of breath yesterday. She denies any associated fever, chest pain, or abdominal pain. Denies any nausea, vomiting, or diarrhea. She denies any prior history of CHF or CAD. Her symptoms are worsened with ambulation and better with rest.  The history is provided by the patient. No language interpreter was used.    Past Medical History  Diagnosis Date  . Lupus ([place])   . GERD (gastroesophageal reflux disease)   . HLD (hyperlipidemia)   . Pleural effusion 09/2015   Past Surgical History  Procedure Laterality Date  . Replacement total knee      Right  . Breast lumpectomy     History reviewed. No pertinent family history. Social History  Substance Use Topics  . Smoking status: Never Smoker   . Smokeless tobacco: Never Used  . Alcohol Use: No   OB History    No data available     Review of Systems  Constitutional: Negative for fever and chills.  Respiratory: Positive for shortness of breath.   Cardiovascular: Negative for chest pain.  Gastrointestinal: Negative for nausea, vomiting, diarrhea and constipation.   Genitourinary: Negative for dysuria.  All other systems reviewed and are negative.     Allergies  Penicillins  Home Medications   Prior to Admission medications   Medication Sig Start Date End Date Taking? Authorizing Provider  atorvastatin (LIPITOR) 20 MG tablet Take 20 mg by mouth daily.    Historical Provider, MD  guaiFENesin-dextromethorphan (ROBITUSSIN DM) 100-10 MG/5ML syrup Take 5 mLs by mouth every 4 (four) hours as needed for cough (over the counter). 09/24/15   Ripudeep Krystal Eaton, MD  omeprazole (PRILOSEC) 20 MG capsule Take 20 mg by mouth daily.    Historical Provider, MD   BP 101/70 mmHg  Temp(Src) 98.2 F (36.8 C) (Oral)  Resp 15  Ht 5\' 1"  (1.549 m)  Wt 68.539 kg  BMI 28.57 kg/m2  SpO2 94% Physical Exam  Constitutional: She is oriented to person, place, and time. She appears well-developed and well-nourished.  HENT:  Head: Normocephalic and atraumatic.  Eyes: Conjunctivae and EOM are normal. Pupils are equal, round, and reactive to light.  Neck: Normal range of motion. Neck supple.  Cardiovascular: Normal rate and regular rhythm.  Exam reveals no gallop and no friction rub.   No murmur heard. Pulmonary/Chest: Effort normal and breath sounds normal. No respiratory distress. She has no wheezes. She has no rales. She exhibits no tenderness.  Abdominal: Soft. Bowel sounds are normal. She exhibits no distension and no mass. There is no tenderness. There is no rebound and no guarding.  Musculoskeletal: Normal range of motion. She exhibits no edema or tenderness.  Neurological: She is  alert and oriented to person, place, and time.  Skin: Skin is warm and dry.  Psychiatric: She has a normal mood and affect. Her behavior is normal. Judgment and thought content normal.  Nursing note and vitals reviewed.   ED Course  Procedures (including critical care time) Results for orders placed or performed during the hospital encounter of 0000000  Basic metabolic panel  Result  Value Ref Range   Sodium 140 135 - 145 mmol/L   Potassium 3.9 3.5 - 5.1 mmol/L   Chloride 111 101 - 111 mmol/L   CO2 22 22 - 32 mmol/L   Glucose, Bld 99 65 - 99 mg/dL   BUN 15 6 - 20 mg/dL   Creatinine, Ser 0.89 0.44 - 1.00 mg/dL   Calcium 8.8 (L) 8.9 - 10.3 mg/dL   GFR calc non Af Amer >60 >60 mL/min   GFR calc Af Amer >60 >60 mL/min   Anion gap 7 5 - 15  CBC with Differential/Platelet  Result Value Ref Range   WBC 9.9 4.0 - 10.5 K/uL   RBC 5.29 (H) 3.87 - 5.11 MIL/uL   Hemoglobin 15.0 12.0 - 15.0 g/dL   HCT 46.0 36.0 - 46.0 %   MCV 87.0 78.0 - 100.0 fL   MCH 28.4 26.0 - 34.0 pg   MCHC 32.6 30.0 - 36.0 g/dL   RDW 14.6 11.5 - 15.5 %   Platelets 161 150 - 400 K/uL   Neutrophils Relative % 63 %   Lymphocytes Relative 24 %   Monocytes Relative 9 %   Eosinophils Relative 4 %   Basophils Relative 0 %   Neutro Abs 6.2 1.7 - 7.7 K/uL   Lymphs Abs 2.4 0.7 - 4.0 K/uL   Monocytes Absolute 0.9 0.1 - 1.0 K/uL   Eosinophils Absolute 0.4 0.0 - 0.7 K/uL   Basophils Absolute 0.0 0.0 - 0.1 K/uL   Smear Review MORPHOLOGY UNREMARKABLE   Brain natriuretic peptide  Result Value Ref Range   B Natriuretic Peptide 20.7 0.0 - 100.0 pg/mL  Protime-INR  Result Value Ref Range   Prothrombin Time 12.1 11.6 - 15.2 seconds   INR 0.87 0.00 - 1.49  APTT  Result Value Ref Range   aPTT 27 24 - 37 seconds  CK  Result Value Ref Range   Total CK 24 (L) 38 - 234 U/L  Lactate dehydrogenase  Result Value Ref Range   LDH 252 (H) 98 - 192 U/L  I-stat troponin, ED  Result Value Ref Range   Troponin i, poc 0.02 0.00 - 0.08 ng/mL   Comment 3           Dg Chest 1 View  09/22/2015  CLINICAL DATA:  Status post left-sided thoracentesis EXAM: CHEST 1 VIEW COMPARISON:  Study obtained earlier in the day. FINDINGS: Left effusion is smaller following thoracentesis. No pneumothorax. Pleural effusions remain bilaterally, now larger on the right than on the left. There is patchy atelectasis in each lung base. Heart  size and pulmonary vascular normal. No adenopathy. No bone lesions. IMPRESSION: Left-sided pleural effusion is much smaller following thoracentesis. Small pleural effusions, larger on the right than on the left, are now present. There is atelectatic change in both lung bases, more on the left on the right. No change in cardiac silhouette. No pneumothorax. Electronically Signed   By: Lowella Grip III M.D.   On: 09/22/2015 11:39   Dg Chest 2 View  10/03/2015  CLINICAL DATA:  Shortness of breath, chest tightness, pleural  effusions, lupus EXAM: CHEST  2 VIEW COMPARISON:  09/22/2015 FINDINGS: Moderate to large bilateral pleural effusions, larger than the 09/22/2015 comparison. Increased bibasilar compressive atelectasis/ consolidation. Upper lobes remain clear. Trachea is midline. Cardiac silhouette is obscured by the effusions. Aorta is atherosclerotic and mildly dilated. No acute osseous finding. IMPRESSION: Enlarging bilateral pleural effusions with bibasilar compressive atelectasis/ consolidation. Electronically Signed   By: Jerilynn Mages.  Shick M.D.   On: 10/03/2015 10:43   X-ray Chest Pa And Lateral  09/22/2015  CLINICAL DATA:  Bilateral pleural effusions, symptomatic [name] improved. EXAM: CHEST  2 VIEW COMPARISON:  Portable chest x-ray of September 21, 2015 and chest CT scan of same day. FINDINGS: The patient has known moderate bilateral pleural effusions greater on the left than on the right. The volume of these effusions is stable to slightly increased. The cardiac silhouette remains enlarged. The pulmonary vascularity is not engorged. There is no pneumothorax. The mediastinum is normal in width. IMPRESSION: Moderate sized bilateral pleural effusions not greatly changed since yesterday's study. Electronically Signed   By: David  Martinique M.D.   On: 09/22/2015 08:16   Dg Chest 2 View  09/21/2015  CLINICAL DATA:  Acute onset of mid chest pain and cough. Initial encounter. EXAM: CHEST  2 VIEW COMPARISON:  Chest  radiograph performed 09/07/2015 FINDINGS: The lungs are well-aerated. Small to moderate bilateral pleural effusions are noted, left greater than right. Mild bibasilar opacities may reflect mild interstitial edema. There is no evidence of pneumothorax. The heart is mildly enlarged. No acute osseous abnormalities are seen. IMPRESSION: Small to moderate bilateral pleural effusions, left greater than right. Mild bibasilar opacities may reflect mild interstitial edema. Electronically Signed   By: Garald Balding M.D.   On: 09/21/2015 03:57   Dg Chest 2 View  09/07/2015  CLINICAL DATA:  70 year old female with shortness of breath and chest tightness EXAM: CHEST  2 VIEW COMPARISON:  None. FINDINGS: Two views of the chest demonstrate small left pleural effusion with associated compressive atelectasis of the left lung base. Underlying infiltrate or superimposed pneumonia not excluded. Trace right pleural effusion noted. There is mild diffuse increased interstitial prominence which may represent mild congestion. The cardiac silhouette is within normal limits. No acute osseous pathology. IMPRESSION: Small bilateral pleural effusions, left greater right with subsegmental atelectatic changes of the left lung base. Pneumonia is not excluded. Electronically Signed   By: Anner Crete M.D.   On: 09/07/2015 01:30   Ct Angio Chest Pe W/cm &/or Wo Cm  09/21/2015  CLINICAL DATA:  Shortness of breath. EXAM: CT ANGIOGRAPHY CHEST WITH CONTRAST TECHNIQUE: Multidetector CT imaging of the chest was performed using the standard protocol during bolus administration of intravenous contrast. Multiplanar CT image reconstructions and MIPs were obtained to evaluate the vascular anatomy. CONTRAST:  100 mL of Isovue 370 intravenously. COMPARISON:  Chest radiograph of same day. FINDINGS: No pneumothorax is noted. Large bilateral pleural effusions are noted with adjacent atelectasis of the lower lobes, left greater than right. Diffusely  enlarged thyroid gland is noted with possible left thyroid mass. There is no definite evidence of pulmonary embolus. There is no evidence of thoracic aortic dissection or aneurysm. Visualized portion of upper abdomen is unremarkable. No mediastinal adenopathy is noted. No significant osseous abnormality is noted. Review of the MIP images confirms the above findings. IMPRESSION: No definite evidence of pulmonary embolus. Large bilateral pleural effusions are noted with adjacent atelectasis of the lower lobes, left greater than right. Diffusely enlarged thyroid gland is noted with possible  left thyroid mass. Dedicated thyroid ultrasound is recommended on nonemergent basis. Electronically Signed   By: Marijo Conception, M.D.   On: 09/21/2015 10:02   [place] Chest  09/23/2015  CLINICAL DATA:  70 year old female with history of small right pleural effusion in the right chest. Evaluate for potential thoracentesis. EXAM: CHEST ULTRASOUND COMPARISON:  No priors. FINDINGS: Small right pleural effusion noted, of insufficient volume to safely perform thoracentesis. IMPRESSION: 1. Small right pleural effusion. Electronically Signed   By: Vinnie Langton M.D.   On: 09/23/2015 12:42   US Thyroid  09/22/2015  CLINICAL DATA:  Mass involving the thyroid region. EXAM: THYROID ULTRASOUND TECHNIQUE: Ultrasound examination of the thyroid gland and adjacent soft tissues was performed. COMPARISON:  02/11/2013 FINDINGS: There is unchanged mild diffuse heterogeneity of thyroid parenchymal echotexture. No definitive new or enlarging thyroid nodules. Right thyroid lobe Measurements: Enlarged measuring 5.7 x 3.1 x 2.5 cm, increased in size in interval, previously, 5.0 x 2.0 x 2.4 cm. Right, mid, medial (labeled #1) - 0.6 x 0.7 x 0.4 cm - mixed echogenic, partially cystic, partially solid - grossly unchanged, previously, 0.6 x 0.4 x 0.6 cm Right, mid (labeled #2) - 0.6 x 0.8 x 0.5 cm - mixed echogenic, partially cystic, predominantly solid -  grossly unchanged, previously, 0.7 x 0.6 x 0.7 cm Right, inferior (labeled #3) - 0.4 x 0.5 x 0.3 cm - mixed echogenic, partially cystic, partially solid - grossly unchanged, previously, 0.7 x 0.8 x 0.6 cm Left thyroid lobe Measurements: Enlarged measuring 5.5 x 3.4 x 2.9 cm, increased in size in the interval, previously, 4.8 x 2.1 x 2.9 cm. Left, mid, lateral (labeled #1) - 0.9 x 0.9 x 0.7 cm - mixed echogenic, solid - grossly unchanged, previously, 1.0 x 1.0 x 0.7 cm Left, mid (labeled #2) - 1.0 x 1.2 x 1.0 cm- anechoic and cystic with minimal eccentric septation/wall thickening - grossly unchanged, previously, 2.9 x 0.6 x 0.8 cm with minimal interval cystic degeneration. Left, inferior (labeled #3) - 0.5 x 0.7 x 0.6 cm-mixed echogenic, partially cystic, partially solid - grossly unchanged, previously, 0.5 x 0.4 x 0.4 cm Isthmus Thickness: Enlarged measuring 0.8 cm in diameter, unchanged. Mid - 0.7 x 1.0 x 0.6 cm - mixed echogenic, solid - decreased in size in interval, previously, 1.6 x 0.7 x 0.7 cm Lymphadenopathy None visualized. IMPRESSION: Mild interval apparent enlargement of the thyroid gland without development of new or enlarging thyroid nodules. All of the discretely measured thyroid nodules appear grossly unchanged since the 02/2013 examination. Electronically Signed   By: Sandi Mariscal M.D.   On: 09/22/2015 14:34   US Thoracentesis Asp Pleural Space W/img Guide  09/22/2015  INDICATION: Shortness of breath. Bilateral pleural effusions. Request for ultrasound guided diagnostic and therapeutic thoracentesis. EXAM: ULTRASOUND GUIDED LEFT THORACENTESIS MEDICATIONS: 1% Lidocaine. COMPLICATIONS: None immediate. PROCEDURE: An ultrasound guided thoracentesis was thoroughly discussed with the patient and questions answered. The benefits, risks, alternatives and complications were also discussed. The patient understands and wishes to proceed with the procedure. Written consent was obtained. Ultrasound was  performed to localize and mark an adequate pocket of fluid in the left chest. The area was then prepped and draped in the normal sterile fashion. 1% Lidocaine was used for local anesthesia. Under ultrasound guidance a 6 Fr Safe-T-Centesis catheter was introduced. Thoracentesis was performed. The catheter was removed and a dressing applied. FINDINGS: A total of approximately 1.08 liters of clear yellow fluid was removed. Samples were sent to the laboratory as  requested by the clinical team. IMPRESSION: Successful ultrasound guided left thoracentesis yielding 1.08 liters of pleural fluid. Read by:  Gareth Eagle, PA-C Electronically Signed   By: Corrie Mckusick D.O.   On: 09/22/2015 12:52    I have personally reviewed and evaluated these images and lab results as part of my medical decision-making.   EKG Interpretation None      MDM   Final diagnoses:  Pleural effusion, bilateral  SOB (shortness of breath)    Patient with shortness of breath. Recently admitted for pleural effusions. She was not able to tolerate Lasix secondary to hypotension, and therefore had a thoracentesis performed by interventional radiology. Thoracentesis was consistent with transudative pleural effusion, cultures were negative. She also had a negative CT PE study.  Chest x-ray shows increasing bilateral pleural effusions. No leukocytosis. Electrolytes are normal.  Patient unable to ambulate the hallway without becoming extremely short of breath.  Patient discussed with Dr. Wilson Singer, who recommends consulting with Hospitalist regarding admission for thoracentesis and possible pleural catheter by IR.    Montine Circle, PA-C 10/03/15 1541  Virgel Manifold, MD 10/11/15 1310

## 2015-10-03 NOTE — Progress Notes (Signed)
Jane Chavez is a 70 y.o. female patient admitted from ED awake, alert - oriented  X 4 .  VSS - Blood pressure 103/68, pulse 114, temperature 98.8 F (37.1 C), temperature source Oral, resp. rate 29, height 5\' 1"  (1.549 m), weight 67.858 kg (149 lb 9.6 oz), SpO2 93 %.    IV in place, occlusive dsg intact without redness.  Orientation to room, and floor completed with information packet given to patient/family.  Patient declined safety video at this time.  Admission INP armband ID verified with patient/family, and in place.   SR up x 2, fall assessment complete, with patient and family able to verbalize understanding of risk associated with falls, and verbalized understanding to call nsg before up out of bed.  Call light within reach, patient able to voice, and demonstrate understanding.     Will cont to eval and treat per MD orders.  Janalyn Shy, RN 10/03/2015 2:23 PM

## 2015-10-03 NOTE — ED Notes (Signed)
EDP at bedside  

## 2015-10-03 NOTE — H&P (Signed)
History and Physical    Jane Chavez [id number] DOB: [birth year] DOA: 10/03/2015   Referring MD/NP/PA: Wilson Singer / ER PCP: [name], MD  Outpatient Specialists: New rheumatologist -initial visit pending- pt cannot recall MD name Patient coming from: Private residence  Chief Complaint: Dyspnea on exertion  HPI:  Jane Chavez is a 70 y.o. female with medical history significant for dyslipidemia, enlarged thyroid gland without concerning nodularity, left ventricular diastolic dysfunction grade 2 without associated pulmonary hypertension or valvular disorder, history of lupus (diagnosed at least 10 years prior) off medical therapies greater than 5 years. She was recently discharged on 4/16 after an admission for dyspnea and chest tightness related to bilateral pleural effusions felt at that time to be related to grade 2 diastolic heart failure and reported as transudative etiology. Patient underwent left thoracentesis with just over 1 L of fluid obtained. Pleural cytology with elevated LDH and no serum LDH obtained. Because of suboptimal blood pressure patient was not continued on Lasix at discharge. Consideration was given to performing thoracentesis on the right but there was insufficient volume to safely perform thoracentesis.  She returns to the ER with progressive dyspnea on exertion with paroxysmal coughing with deep breathing and chest tightness. Patient can barely walk 3-4 feet without developing significant dyspnea. She has not had any lower extremity edema or weight gain. She does not have any uncontrolled hypertension. She has a nonproductive cough and she has not had any fevers or chills. She reports she is to establish with a new rheumatologist this week.  ED Course:  Temperature 98.2, BP 101/70 with repeat BP 107/83-pulse 108-respirations 15 at rest up to 29 after ambulation-room air saturations 94% with decreased O2 saturation still 90% with ambulation. Two-view chest x-ray:  Enlarging bilateral pleural effusions with bibasilar compressive atelectasis/consolidation Lab data: Sodium 140, potassium 3.9, BUN 15, creatinine 0.89, BNP 20.7, troponin 0.02, WBCs 9900 with normal differential, hemoglobin 15, platelets 161,000   Review of Systems:  In addition to the HPI above,  No Fever-chills, myalgias or other constitutional symptoms No Headache, changes with Vision or hearing, new weakness, tingling, numbness in any extremity, No problems swallowing food or Liquids, indigestion/reflux No palpitations, orthopnea No Abdominal pain, N/V; no melena or hematochezia, no dark tarry stools, Bowel movements are regular, No dysuria, hematuria or flank pain No new skin rashes, lesions, masses or bruises, No new joints pains-aches No recent weight gain or loss No polyuria, polydypsia or polyphagia,   Past Medical History  Diagnosis Date  . Lupus ([place])   . GERD (gastroesophageal reflux disease)   . HLD (hyperlipidemia)   . Pleural effusion 09/2015    Past Surgical History  Procedure Laterality Date  . Replacement total knee      Right  . Breast lumpectomy       reports that she has never smoked. She has never used smokeless tobacco. She reports that she does not drink alcohol or use illicit drugs.  Allergies  Allergen Reactions  . Penicillins Shortness Of Breath    Has patient had a PCN reaction causing immediate rash, facial/tongue/throat swelling, SOB or lightheadedness with hypotension: Yes Has patient had a PCN reaction causing severe rash involving mucus membranes or skin necrosis: No Has patient had a PCN reaction that required hospitalization No Has patient had a PCN reaction occurring within the last 10 years: No If all of the above answers are "NO", then may proceed with Cephalosporin use.     Family history reviewed and not pertinent   Prior  to Admission medications   Medication Sig Start Date End Date Taking? Authorizing Provider  atorvastatin  (LIPITOR) 20 MG tablet Take 20 mg by mouth daily.   Yes Historical Provider, MD  guaiFENesin-dextromethorphan (ROBITUSSIN DM) 100-10 MG/5ML syrup Take 5 mLs by mouth every 4 (four) hours as needed for cough (over the counter). 09/24/15  Yes Ripudeep Krystal Eaton, MD  ibuprofen (ADVIL,MOTRIN) 200 MG tablet Take 600 mg by mouth every 6 (six) hours as needed for headache (pain).   Yes Historical Provider, MD  omeprazole (PRILOSEC) 20 MG capsule Take 20 mg by mouth daily.   Yes Historical Provider, MD    Physical Exam: Filed Vitals:   10/03/15 0953 10/03/15 1125 10/03/15 1200  BP: 101/70 96/78 107/83  Pulse:  98 102  Temp: 98.2 F (36.8 C)    TempSrc: Oral    Resp: 15 23 29   Height: 5\' 1"  (1.549 m)    Weight: 151 lb 1.6 oz (68.539 kg)    SpO2: 94% 93% 95%      Constitutional: NAD, calm, comfortable Filed Vitals:   10/03/15 0953 10/03/15 1125 10/03/15 1200  BP: 101/70 96/78 107/83  Pulse:  98 102  Temp: 98.2 F (36.8 C)    TempSrc: Oral    Resp: 15 23 29   Height: 5\' 1"  (1.549 m)    Weight: 151 lb 1.6 oz (68.539 kg)    SpO2: 94% 93% 95%   Eyes: PERRL, lids and conjunctivae normal ENMT: Mucous membranes are moist. Posterior pharynx clear of any exudate or lesions.Normal dentition.  Neck: normal, supple, no masses, no Palpable thyromegaly Respiratory: clear to auscultation bilaterally although markedly diminished in the bases left greater than right, no wheezing, no crackles. Normal respiratory effort at rest but noted to become tachypnea with increased work of breathing with even minimal activity such as repositioning self on stretcher. No accessory muscle use. Documented decrease in O2 saturation to 90% on room air with ambulation. Cardiovascular: Regular rate and rhythm, no murmurs / rubs / gallops. No extremity edema. 2+ pedal pulses. No carotid bruits.  Abdomen: no tenderness, no masses palpated. No hepatosplenomegaly. Bowel sounds positive.  Musculoskeletal: no clubbing / cyanosis. No  joint deformity upper and lower extremities. Good ROM, no contractures. Normal muscle tone.  Skin: no rashes, lesions, ulcers. No induration Neurologic: CN 2-12 grossly intact. Sensation intact, DTR normal. Strength 5/5 in all 4.  Psychiatric: Normal judgment and insight. Alert and oriented x 3. Normal mood.    Labs on Admission: I have personally reviewed following labs and imaging studies  CBC:  Recent Labs Lab 10/03/15 1009  WBC 9.9  NEUTROABS 6.2  HGB 15.0  HCT 46.0  MCV 87.0  PLT [id number]   Basic Metabolic Panel:  Recent Labs Lab 10/03/15 1009  NA 140  K 3.9  CL 111  CO2 22  GLUCOSE 99  BUN 15  CREATININE 0.89  CALCIUM 8.8*   GFR: Estimated Creatinine Clearance: 54.3 mL/min (by C-G formula based on Cr of 0.89). Liver Function Tests: No results for input(s): AST, ALT, ALKPHOS, BILITOT, PROT, ALBUMIN in the last 168 hours. No results for input(s): LIPASE, AMYLASE in the last 168 hours. No results for input(s): AMMONIA in the last 168 hours. Coagulation Profile: No results for input(s): INR, PROTIME in the last 168 hours. Cardiac Enzymes: No results for input(s): CKTOTAL, CKMB, CKMBINDEX, TROPONINI in the last 168 hours. BNP (last 3 results) No results for input(s): PROBNP in the last 8760 hours. HbA1C: No results for input(s):  HGBA1C in the last 72 hours. CBG: No results for input(s): GLUCAP in the last 168 hours. Lipid Profile: No results for input(s): CHOL, HDL, LDLCALC, TRIG, CHOLHDL, LDLDIRECT in the last 72 hours. Thyroid Function Tests: No results for input(s): TSH, T4TOTAL, FREET4, T3FREE, THYROIDAB in the last 72 hours. Anemia Panel: No results for input(s): VITAMINB12, FOLATE, FERRITIN, TIBC, IRON, RETICCTPCT in the last 72 hours. Urine analysis: No results found for: COLORURINE, APPEARANCEUR, LABSPEC, PHURINE, GLUCOSEU, HGBUR, BILIRUBINUR, KETONESUR, PROTEINUR, UROBILINOGEN, NITRITE, LEUKOCYTESUR Sepsis  Labs: @LABRCNTIP (procalcitonin:4,lacticidven:4) )No results found for this or any previous visit (from the past 240 hour(s)).   Radiological Exams on Admission: Dg Chest 2 View  10/03/2015  CLINICAL DATA:  Shortness of breath, chest tightness, pleural effusions, lupus EXAM: CHEST  2 VIEW COMPARISON:  09/22/2015 FINDINGS: Moderate to large bilateral pleural effusions, larger than the 09/22/2015 comparison. Increased bibasilar compressive atelectasis/ consolidation. Upper lobes remain clear. Trachea is midline. Cardiac silhouette is obscured by the effusions. Aorta is atherosclerotic and mildly dilated. No acute osseous finding. IMPRESSION: Enlarging bilateral pleural effusions with bibasilar compressive atelectasis/ consolidation. Electronically Signed   By: Jerilynn Mages.  Shick M.D.   On: 10/03/2015 10:43    EKG: (Independently reviewed) sinus tachycardia with ventricular rate 117 bpm, QTC 468 ms, diffuse elevated J-point without any ST segment or T-wave changes concerning for ischemia  Assessment/Plan Principal Problem:   Acute respiratory failure with hypoxia  /Bilateral pleural effusion (Recurrent) -Rapid reaccumulation of effusions over a period of 12 days. Suspected etiology lupus pneumonitis -Pulmonary medicine consulted -Interventional radiology consulted for ultrasound-guided thoracentesis: I will repeat cytology and have added serum LDH to determine Light's criteria (prior admission reported as transudative and felt to be related to heart failure)-lupus related effusions typically are exudative -Continue supportive care with prn oxygen -Prednisone 60 mg per day (1 mg/kg) -Previous admission pleural fluid LDH was elevated at 71 and patient had thrombocytopenia which is also consistent with lupus mediated pneumonitis  Active Problems:   Left ventricular diastolic dysfunction, NYHA class 2 -At this juncture do not suspect decompensated heart failure as etiology to effusions noting patient's blood  pressures actually suboptimal, her hemoglobin is 15 which is 3 g higher than at discharge which is more reflective of volume depletion -BNP 20.7 -Suspect patient may have lupus related changes to her heart ie cardiac remodeling    SLE -Appears to have recent reemergence of disease process after previously established remission and off medications for at least 5 years -Is to establish with new rheumatologist-has appointment in place this week -Check anti-DNA antibody (double-stranded), ANA antibodies and CPK -Prednisone as above    Enlarged thyroid gland -Underwent ultrasound last admission which demonstrated benign appearing enlarged thyroid without nodularity -TSH was normal last admission    GERD  -Continue PPI    HLD  -Continue statin      DVT prophylaxis: Lovenox with first dose at 10 PM tonight Code Status: DO NOT RESUSCITATE Family Communication: Daughter at bedside [place] Disposition Plan: Return to previous home environment Consults called: [place]/ Pulmonary medicine Admission status: Observation Mobility: Without assistive devices   Addiel Mccardle L. ANP-BC [facility] Pager [phone]   If 7PM-7AM, please contact night-coverage [email or web address] Password [email or web address]  10/03/2015, 1:24 PM

## 2015-10-03 NOTE — Consult Note (Addendum)
Name: Jane Chavez MRN: [id number] DOB: November 17, 1945    ADMISSION DATE:  10/03/2015 CONSULTATION DATE:  10/03/15  REFERRING MD :  Marily Memos  CHIEF COMPLAINT:  SOB   HISTORY OF PRESENT ILLNESS:  Jane Chavez is a 70 y.o. female with a PMH as outlined below including known dCHF (grade 2DD per echo from 09/22/15) as well as lupus (off meds for > 5 years).  She had recent admission 09/21/15 through 09/24/15 when she was hospitalized for dyspnea and bilateral effusions.  She had left thoracentesis 04/14 by IR at the time which yielded 1L of transudative fluid.  She also had right pleural effusion; however, when assess via bedside US, this was small and not large enough to warrant thoracentesis.  She had labile BP's on discharge; therefore, lasix was not continued on discharge.  On 10/03/15, she returned to ED with recurrent dyspnea, chest tightness, and coughing associated with deep breaths.  She has not been able to walk moe than 3 - 4 feet before she experienced significant dyspnea.  She did not report any weight gain or increase in peripheral edema.  CXR revealed enlarging bilateral pleural effusions along with bibasilar compressive atelectasis.  She has been referred to IR again for repeat thoracentesis and PCCM has been consulted for further evaluation of her effusions.   PAST MEDICAL HISTORY :   has a past medical history of Lupus ([place]); GERD (gastroesophageal reflux disease); HLD (hyperlipidemia); and Pleural effusion (09/2015).  has past surgical history that includes Replacement total knee and Breast lumpectomy. Prior to Admission medications   Medication Sig Start Date End Date Taking? Authorizing Provider  atorvastatin (LIPITOR) 20 MG tablet Take 20 mg by mouth daily.   Yes Historical Provider, MD  guaiFENesin-dextromethorphan (ROBITUSSIN DM) 100-10 MG/5ML syrup Take 5 mLs by mouth every 4 (four) hours as needed for cough (over the counter). 09/24/15  Yes Ripudeep Krystal Eaton, MD  ibuprofen  (ADVIL,MOTRIN) 200 MG tablet Take 600 mg by mouth every 6 (six) hours as needed for headache (pain).   Yes Historical Provider, MD  omeprazole (PRILOSEC) 20 MG capsule Take 20 mg by mouth daily.   Yes Historical Provider, MD   Allergies  Allergen Reactions  . Penicillins Shortness Of Breath    Has patient had a PCN reaction causing immediate rash, facial/tongue/throat swelling, SOB or lightheadedness with hypotension: Yes Has patient had a PCN reaction causing severe rash involving mucus membranes or skin necrosis: No Has patient had a PCN reaction that required hospitalization No Has patient had a PCN reaction occurring within the last 10 years: No If all of the above answers are "NO", then may proceed with Cephalosporin use.     FAMILY HISTORY:  family history is not on file. SOCIAL HISTORY:  reports that she has never smoked. She has never used smokeless tobacco. She reports that she does not drink alcohol or use illicit drugs.  REVIEW OF SYSTEMS:   All negative; except for those that are bolded, which indicate positives.  Constitutional: weight loss, weight gain, night sweats, fevers, chills, fatigue, weakness.  HEENT: headaches, sore throat, sneezing, nasal congestion, post nasal drip, difficulty swallowing, tooth/dental problems, visual complaints, visual changes, ear aches. Neuro: difficulty with speech, weakness, numbness, ataxia. CV:  chest pain, orthopnea, PND, swelling in lower extremities, dizziness, palpitations, syncope, chest tightness. Resp: dry cough, hemoptysis, dyspnea, wheezing. GI  heartburn, indigestion, abdominal pain, nausea, vomiting, diarrhea, constipation, change in bowel habits, loss of appetite, hematemesis, melena, hematochezia.  GU: dysuria, change in color of  urine, urgency or frequency, flank pain, hematuria. MSK: joint pain or swelling, decreased range of motion. Psych: change in mood or affect, depression, anxiety, suicidal ideations, homicidal  ideations. Skin: rash, itching, bruising.    SUBJECTIVE:  SOB, worse with exertion.  Also has chest tightness.  VITAL SIGNS: Temp:  [98.2 F (36.8 C)] 98.2 F (36.8 C) (04/25 0953) Pulse Rate:  [98-102] 102 (04/25 1200) Resp:  [15-29] 29 (04/25 1200) BP: (96-107)/(70-83) 107/83 mmHg (04/25 1200) SpO2:  [93 %-95 %] 95 % (04/25 1200) Weight:  [68.539 kg (151 lb 1.6 oz)] 68.539 kg (151 lb 1.6 oz) (04/25 0953)  PHYSICAL EXAMINATION: General: Adult female, resting in bed, in NAD. Neuro: A&O x 3, non-focal.  HEENT: /AT. PERRL, sclerae anicteric. Cardiovascular: RRR, no M/R/G.  Lungs: Respirations shallow.  BS diminished bilateral bases with dullness to percussion. Abdomen: BS x 4, soft, NT/ND.  Musculoskeletal: No gross deformities, no edema.  Skin: Intact, warm, no rashes.     Recent Labs Lab 10/03/15 1009  NA 140  K 3.9  CL 111  CO2 22  BUN 15  CREATININE 0.89  GLUCOSE 99    Recent Labs Lab 10/03/15 1009  HGB 15.0  HCT 46.0  WBC 9.9  PLT 161   Dg Chest 2 View  10/03/2015  CLINICAL DATA:  Shortness of breath, chest tightness, pleural effusions, lupus EXAM: CHEST  2 VIEW COMPARISON:  09/22/2015 FINDINGS: Moderate to large bilateral pleural effusions, larger than the 09/22/2015 comparison. Increased bibasilar compressive atelectasis/ consolidation. Upper lobes remain clear. Trachea is midline. Cardiac silhouette is obscured by the effusions. Aorta is atherosclerotic and mildly dilated. No acute osseous finding. IMPRESSION: Enlarging bilateral pleural effusions with bibasilar compressive atelectasis/ consolidation. Electronically Signed   By: Jerilynn Mages.  Shick M.D.   On: 10/03/2015 10:43    STUDIES:  CXR 04/25 > enlarging bilateral effusions with bibasilar compressive atelectasis.  SIGNIFICANT EVENTS  04/25 > admitted with recurrent bilateral pleural effusions.  IR consulted for repeat thoracentesis, PCCM consulted for further recs.  ASSESSMENT / PLAN:  Recurrent  bilateral pleural effusions - in the setting of known dCHF this is more than likely chronic / recurrent transudative effusion.  Last horacentesis 04/14 suggestive of transudative; however, no serum LDH was drawn so can not confidently call it so.  Pt does also have hx of lupus; therefore, effusions could potentially be autoimmune related (though less likely; would expect high LDH on labs).  Acute hypoxic respiratory failure - due to above. ? Component of lupus pneumonitis. Plan: Furosemide 40mg  x 1. Continue aggressive diuresis as BP and SCr permit. IR consulted for repeat thoracentesis - f/u on all routine labs which have already been sent. Continue supplemental O2 as needed to maintain SpO2 > 92%. Continue steroids. Follow CXR.  SLE - pt has apparently been off meds for > 5 years and had been doing well.  She was scheduled to follow up with outpatient rheum this week for further evaluation / management. Plan: F/u on ds-DNA, ANA. Continue prednisone for now. Agree needs outpatient f/u with rheum.  Montey Hora, [facility] Pager: [phone]  or [phone] 10/03/2015, 1:33 PM  Attending Note:  70 year old female with lupus history presenting to the hospital with SOB.  Had a thoracentesis done 2 wks ago that revealed transudative effusion.  On exam, dullness to percussion but clear to auscultation.  I reviewed CXR myself, bilateral pleural effusions noted.  Discussed with  PCCM-NP.  Pleural effusion:  - IR to thora.  - F/U on analysis.  Hypoxemia:  - Titrate O2 for sat of 88-92%.  - May need amb desat prior to discharge.  Lupus: ?existence of pneumonitis.  - If no improvement with diureses then will order CT of the chest.  - Steroids.  - ds-DNA and ANA.  ?Pulmonary edema:  - Lasix.  - Check BNP.  Patient seen and examined, agree with above note.  I dictated the care and orders written for this patient under my  direction.  Rush Farmer, MD [phone]

## 2015-10-03 NOTE — ED Notes (Signed)
Pt. ambulated in hallway. Pt. Oxygen saturation dropped to 91% while walking.

## 2015-10-03 NOTE — ED Notes (Signed)
Pt states she had a thoracentesis on her L side on Easter. Pt states they told her that her right side had fluid building up at the time but was not enough for a thoracentesis. Pt believes it has gotten worse and is increasingly SOB.

## 2015-10-03 NOTE — Progress Notes (Signed)
Report received from [place], [place] for admission to [phone]

## 2015-10-03 NOTE — ED Notes (Signed)
Pt also states she is having some chest tightness

## 2015-10-04 ENCOUNTER — Observation Stay (HOSPITAL_COMMUNITY): Payer: Medicare Other

## 2015-10-04 DIAGNOSIS — R0602 Shortness of breath: Secondary | ICD-10-CM | POA: Diagnosis not present

## 2015-10-04 DIAGNOSIS — J948 Other specified pleural conditions: Secondary | ICD-10-CM | POA: Diagnosis not present

## 2015-10-04 DIAGNOSIS — J9811 Atelectasis: Secondary | ICD-10-CM | POA: Diagnosis not present

## 2015-10-04 DIAGNOSIS — J90 Pleural effusion, not elsewhere classified: Secondary | ICD-10-CM | POA: Diagnosis not present

## 2015-10-04 DIAGNOSIS — M329 Systemic lupus erythematosus, unspecified: Secondary | ICD-10-CM | POA: Diagnosis not present

## 2015-10-04 DIAGNOSIS — J9601 Acute respiratory failure with hypoxia: Secondary | ICD-10-CM | POA: Diagnosis not present

## 2015-10-04 LAB — CBC
HCT: 38.8 % (ref 36.0–46.0)
Hemoglobin: 12.6 g/dL (ref 12.0–15.0)
MCH: 28.2 pg (ref 26.0–34.0)
MCHC: 32.5 g/dL (ref 30.0–36.0)
MCV: 86.8 fL (ref 78.0–100.0)
Platelets: 128 10*3/uL — ABNORMAL LOW (ref 150–400)
RBC: 4.47 MIL/uL (ref 3.87–5.11)
RDW: 14.7 % (ref 11.5–15.5)
WBC: 10.3 10*3/uL (ref 4.0–10.5)

## 2015-10-04 LAB — PH, BODY FLUID: pH, Body Fluid: 7.7

## 2015-10-04 LAB — COMPREHENSIVE METABOLIC PANEL
ALT: 15 U/L (ref 14–54)
AST: 18 U/L (ref 15–41)
Albumin: 1.9 g/dL — ABNORMAL LOW (ref 3.5–5.0)
Alkaline Phosphatase: 32 U/L — ABNORMAL LOW (ref 38–126)
Anion gap: 6 (ref 5–15)
BUN: 17 mg/dL (ref 6–20)
CO2: 22 mmol/L (ref 22–32)
Calcium: 8.2 mg/dL — ABNORMAL LOW (ref 8.9–10.3)
Chloride: 112 mmol/L — ABNORMAL HIGH (ref 101–111)
Creatinine, Ser: 0.88 mg/dL (ref 0.44–1.00)
GFR calc Af Amer: >60 mL/min (ref >60)
GFR calc non Af Amer: >60 mL/min (ref >60)
Glucose, Bld: 89 mg/dL (ref 65–99)
Potassium: 3.9 mmol/L (ref 3.5–5.1)
Sodium: 140 mmol/L (ref 135–145)
Total Bilirubin: 0.3 mg/dL (ref 0.3–1.2)
Total Protein: 5.6 g/dL — ABNORMAL LOW (ref 6.5–8.1)

## 2015-10-04 LAB — BODY FLUID CELL COUNT WITH DIFFERENTIAL
Eos, Fluid: 0 %
Lymphs, Fluid: 42 %
Monocyte-Macrophage-Serous Fluid: 58 % (ref 50–90)
Neutrophil Count, Fluid: 0 % (ref 0–25)
Other Cells, Fluid: 0 %
Total Nucleated Cell Count, Fluid: 505 cu mm (ref 0–1000)

## 2015-10-04 LAB — PROTEIN, BODY FLUID: Total protein, fluid: 3.3 g/dL

## 2015-10-04 LAB — MISC LABCORP TEST (SEND OUT)
LabCorp test name: 88062
Labcorp test code: 990010

## 2015-10-04 LAB — LACTATE DEHYDROGENASE, PLEURAL OR PERITONEAL FLUID: LD, Fluid: 62 U/L — ABNORMAL HIGH (ref 3–23)

## 2015-10-04 LAB — FANA STAINING PATTERNS: Homogeneous Pattern: 1:640 {titer}

## 2015-10-04 LAB — TRIGLYCERIDES, BODY FLUIDS: Triglycerides, Fluid: 10 mg/dL

## 2015-10-04 LAB — GLUCOSE, SEROUS FLUID: Glucose, Fluid: 116 mg/dL

## 2015-10-04 LAB — ANTI-DNA ANTIBODY, DOUBLE-STRANDED: ds DNA Ab: 92 IU/mL — ABNORMAL HIGH (ref 0–9)

## 2015-10-04 LAB — ANTINUCLEAR ANTIBODIES, IFA: ANA Ab, IFA: POSITIVE — AB

## 2015-10-04 MED ORDER — LIDOCAINE HCL (PF) 1 % IJ SOLN
INTRAMUSCULAR | Status: AC
  Filled 2015-10-04: qty 10

## 2015-10-04 MED ORDER — HYDROXYCHLOROQUINE SULFATE 200 MG PO TABS
200.0000 mg | ORAL_TABLET | Freq: Every day | ORAL | Status: DC
  Administered 2015-10-04 – 2015-10-05 (×2): 200 mg via ORAL
  Filled 2015-10-04 (×2): qty 1

## 2015-10-04 NOTE — Procedures (Signed)
Successful US guided right thoracentesis. Yielded 1 liter of clear yellow fluid. Pt tolerated procedure well. No immediate complications.  Specimen was sent for labs. CXR ordered.  Jettson Crable S Dejanique Ruehl PA-C 10/04/2015 2:13 PM

## 2015-10-04 NOTE — Progress Notes (Signed)
Name: Jane Chavez MRN: [id number] DOB: May 05, 1946    ADMISSION DATE:  10/03/2015 CONSULTATION DATE:  10/03/15  REFERRING MD :  Jane Chavez  CHIEF COMPLAINT:  SOB   HISTORY OF PRESENT ILLNESS:  Jane Chavez is a 70 y.o. female with a PMH as outlined below including known dCHF (grade 2DD per echo from 09/22/15) as well as lupus (off meds for > 5 years).  She had recent admission 09/21/15 through 09/24/15 when she was hospitalized for dyspnea and bilateral effusions.  She had left thoracentesis 04/14 by IR at the time which yielded 1L of transudative fluid.  She also had right pleural effusion; however, when assess via bedside US, this was small and not large enough to warrant thoracentesis.  She had labile BP's on discharge; therefore, lasix was not continued on discharge.  On 10/03/15, she returned to ED with recurrent dyspnea, chest tightness, and coughing associated with deep breaths.  She has not been able to walk moe than 3 - 4 feet before she experienced significant dyspnea.  She did not report any weight gain or increase in peripheral edema.  CXR revealed enlarging bilateral pleural effusions along with bibasilar compressive atelectasis.   SUBJECTIVE Continues to have mild dyspnea Afebrile Denies chest pain  VITAL SIGNS: Temp:  [98.8 F (37.1 C)-99.3 F (37.4 C)] 99.3 F (37.4 C) (04/26 0615) Pulse Rate:  [97-114] 97 (04/26 0615) Resp:  [18-29] 20 (04/26 0615) BP: (92-108)/(64-83) 100/64 mmHg (04/26 0615) SpO2:  [86 %-95 %] 86 % (04/26 0615) Weight:  [149 lb 9.6 oz (67.858 kg)-149 lb 14.6 oz (68 kg)] 149 lb 14.6 oz (68 kg) (04/26 0615)  PHYSICAL EXAMINATION: General: Adult female, resting in bed, in NAD. Neuro: A&O x 3, non-focal.  HEENT: [place]/AT. PERRL, sclerae anicteric. Cardiovascular: RRR, no M/R/G.  Lungs: BS diminished bilateral bases with dullness to percussion. Abdomen: BS x 4, soft, NT/ND.  Musculoskeletal: No gross deformities, no edema.  Skin: Intact, warm, no  rashes.     Recent Labs Lab 10/03/15 1009 10/04/15 0724  NA 140 140  K 3.9 3.9  CL 111 112*  CO2 22 22  BUN 15 17  CREATININE 0.89 0.88  GLUCOSE 99 89    Recent Labs Lab 10/03/15 1009 10/04/15 0724  HGB 15.0 12.6  HCT 46.0 38.8  WBC 9.9 10.3  PLT 161 128*   Dg Chest 1 View  10/03/2015  CLINICAL DATA:  Patient status post left-sided thoracentesis. EXAM: CHEST 1 VIEW COMPARISON:  Chest radiograph 10/03/2015. FINDINGS: Monitoring leads overlie the patient. Stable cardiac and mediastinal contours. Interval decrease in size of now small left pleural effusion. Persistent moderate right pleural effusion with underlying bibasilar consolidative pulmonary opacities. No pneumothorax. IMPRESSION: Interval decrease in size of now small left pleural effusion. Persistent moderate right effusion. Bibasilar opacities favored to represent atelectasis. Infection not excluded. Electronically Signed   By: Jane Chavez M.D.   On: 10/03/2015 17:21   Dg Chest 2 View  10/03/2015  CLINICAL DATA:  Shortness of breath, chest tightness, pleural effusions, lupus EXAM: CHEST  2 VIEW COMPARISON:  09/22/2015 FINDINGS: Moderate to large bilateral pleural effusions, larger than the 09/22/2015 comparison. Increased bibasilar compressive atelectasis/ consolidation. Upper lobes remain clear. Trachea is midline. Cardiac silhouette is obscured by the effusions. Aorta is atherosclerotic and mildly dilated. No acute osseous finding. IMPRESSION: Enlarging bilateral pleural effusions with bibasilar compressive atelectasis/ consolidation. Electronically Signed   By: Jane Chavez.  Shick M.D.   On: 10/03/2015 10:43   US Thoracentesis Asp Pleural Space W/img  Guide  10/03/2015  INDICATION: Shortness of breath. Lupus. Bilateral pleural effusions. Request diagnostic and therapeutic thoracentesis. EXAM: ULTRASOUND GUIDED LEFT THORACENTESIS MEDICATIONS: None. COMPLICATIONS: None immediate. PROCEDURE: An ultrasound guided thoracentesis was  thoroughly discussed with the patient and questions answered. The benefits, risks, alternatives and complications were also discussed. The patient understands and wishes to proceed with the procedure. Written consent was obtained. Ultrasound was performed to localize and mark an adequate pocket of fluid in the left chest. The area was then prepped and draped in the normal sterile fashion. 1% Lidocaine was used for local anesthesia. Under ultrasound guidance a Safe-T-Centesis catheter was introduced. Thoracentesis was performed. The catheter was removed and a dressing applied. FINDINGS: A total of approximately 1.45 L of clear yellow fluid was removed. Samples were sent to the laboratory as requested by the clinical team. IMPRESSION: Successful ultrasound guided left thoracentesis yielding 1.45 L of pleural fluid. Read by: Jane Dike PA-C Electronically Signed   By: Jane Chavez M.D.   On: 10/03/2015 16:54    STUDIES:  CXR 04/25 > enlarging bilateral effusions with bibasilar compressive atelectasis.  SIGNIFICANT EVENTS  04/25 > IR repeat thoracentesis>> 1.5 L on Lt  ASSESSMENT / PLAN:  Recurrent bilateral pleural effusions -Pleural fluid results show low LDH but high protein and cells -would favor exudate Acute hypoxic respiratory failure - ersolved ? Component of lupus pneumonitis. Plan: Continue aggressive diuresis as BP and SCr permit. IR consulted for repeat thoracentesis - f/u on all routine labs which have already been sent. Continue supplemental O2 as needed to maintain SpO2 > 92%. Continue steroids. at her request we will ask IR to perform right-sided thoracentesis    SLE - pt has apparently been off meds for > 5 years and had been doing well.  She was scheduled to follow up with outpatient rheum this week for further evaluation / management. Plan: F/u on ds-DNA, ANA. Continue prednisone for now. Consider restarting Plaquenil that she was on in the past  needs outpatient f/u with  rheum.  Answered all questions for patient and her family in detail  Kara Mead MD. FCCP. [facility] Pager [phone] If no response call [phone]   10/04/2015    10/04/2015, 11:39 AM

## 2015-10-04 NOTE — Progress Notes (Addendum)
PROGRESS NOTE  Jane Chavez [id number] DOB: 17-Jan-1946 DOA: 10/03/2015 PCP: [name], MD Outpatient Specialists:  Brief Narrative: 70 year old woman with history of lupus, grade 2 diastolic dysfunction, recently discharged on 4/16 after an admission for dyspnea and chest tightness related to bilateral pleural effusions felt at that time to be related to grade 2 diastolic heart failure and reported as transudative etiology. Patient underwent left thoracentesis with just over 1 L of fluid obtained. Pleural cytology with elevated LDH and no serum LDH obtained. Because of suboptimal blood pressure patient was not continued on Lasix at discharge. Presented 4/25 with increasing DOE, cough and chest tightness. Admitted for acute respiratory failure without hypoxia, bilateral pleural effusions.  Assessment/Plan: 1. Acute hypoxic respiratory failure secondary to recurrent bilateral pleural effusions. Ddx lupus, diastolic CHF. Pulmonlogy recommended thoracentesis, steroids, diuresis, titrate oxygen to 88-92%. Plan for right-sided thoracentesis today. 2. Bilateral pleural effusions, as above. S/p left thoracentesis 4/25 yielding 1.45 L clear yellow fluid. 3. Grade 2 diastolic CHF appears stable. No lower extremity edema. 4. Lupus off medical therapies >5 years. Now on prednisone. Has outpatient follow-up with rheumatology. Pulmonology has started Plaquenil. 5. Thrombocytopenia. Stable. 6. Sinus tachycardia, TSH T3 and T4 checked last admit, no BB secondary to low normal BP.   Overall improved. Wean oxygen as tolerated. Right thoracentesis per pulmonology today. Plan continue steroids. Plaquenil added.  Likely home 4/27  DVT prophylaxis: SCds Code Status: DNR Family Communication:  Disposition Plan:  Murray Hodgkins, MD  [facility] Direct contact:  --Via amion app OR  --[email or web address]; password [email or web address] and click  [email or web address] contact night coverage as above 10/04/2015, 1:12 PM     Consultants:  Pulmonology  Procedures:  4/26 left thoracentesis yielding 1.45 L clear yellow fluid  Antimicrobials:    HPI/Subjective: Feeling better. Less dyspnea on exertion although still becomes short of breath with ambulation. Some discomfort with inhalation.  Objective: Filed Vitals:   10/03/15 1650 10/03/15 1700 10/03/15 2057 10/04/15 0615  BP: 92/64 93/66 108/69 100/64  Pulse:  106 109 97  Temp:   98.8 F (37.1 C) 99.3 F (37.4 C)  TempSrc:   Oral Oral  Resp:  18 20 20   Height:      Weight:    68 kg (149 lb 14.6 oz)  SpO2:  91% 95% 86%   No intake or output data in the 24 hours ending 10/04/15 1312   Filed Weights   10/03/15 0953 10/03/15 1342 10/04/15 0615  Weight: 68.539 kg (151 lb 1.6 oz) 67.858 kg (149 lb 9.6 oz) 68 kg (149 lb 14.6 oz)    Exam:    Constitutional:  . Appears calm and comfortable sitting in bed Respiratory:  . CTA bilaterally anteriorly; diminished posteriorly, no no wheezes or rhonchi. Some posterior inspiratory crackles.  Marland Kitchen Respiratory effort normal. No retractions or accessory muscle use Cardiovascular:  . RRR, no m/r/g . No LE extremity edema   . Telemetry ST/SR, one pause Psychiatric:  . judgement and insight appear normal . Mental status o Mood, affect appropriate  I have personally reviewed following labs and imaging studies:  Complete metabolic panel unremarkable  Platelet count 128  Scheduled Meds: . atorvastatin  20 mg Oral Daily  . hydroxychloroquine  200 mg Oral Daily  . pantoprazole  40 mg Oral Daily  . predniSONE  60 mg Oral Q breakfast  . sodium chloride flush  3 mL Intravenous Q12H  . sodium chloride flush  3 mL Intravenous Q12H   Continuous Infusions:   Principal Problem:  Bilateral pleural effusion (Recurrent) Active Problems:   Acute respiratory failure with hypoxia (HCC)   Left ventricular diastolic dysfunction, NYHA class 2   SLE (systemic lupus erythematosus) (HCC)   Recurrent pleural  effusion on left     Time spent 20 minutes

## 2015-10-04 NOTE — Care Management Important Message (Signed)
Important Message  Patient Details  Name: Jane Chavez MRN: [id number] Date of Birth: Aug 29, 1945   Medicare Important Message Given:  Yes    Sharin Mons, RN 10/04/2015, 11:19 AM

## 2015-10-05 DIAGNOSIS — J9601 Acute respiratory failure with hypoxia: Secondary | ICD-10-CM | POA: Diagnosis not present

## 2015-10-05 DIAGNOSIS — M329 Systemic lupus erythematosus, unspecified: Secondary | ICD-10-CM | POA: Diagnosis not present

## 2015-10-05 DIAGNOSIS — J90 Pleural effusion, not elsewhere classified: Secondary | ICD-10-CM | POA: Diagnosis not present

## 2015-10-05 MED ORDER — HYDROXYCHLOROQUINE SULFATE 200 MG PO TABS: 200.0000 mg | ORAL_TABLET | Freq: Every day | ORAL | Status: AC

## 2015-10-05 MED ORDER — PREDNISONE 20 MG PO TABS: 60.0000 mg | ORAL_TABLET | Freq: Every day | ORAL | Status: DC

## 2015-10-05 NOTE — Discharge Summary (Signed)
Physician Discharge Summary  Jane Chavez [id number] DOB: [birth year] DOA: 10/03/2015  PCP: [name], MD Patient Care Team: [name], MD as PCP - General (Internal Medicine) [name], MD as Consulting Physician (Rheumatology) [name], MD as Consulting Physician (Pulmonary Disease)  Admit date: 10/03/2015 Discharge date: 10/05/2015  Recommendations for Outpatient Follow-up:  1. Follow-up bilateral pleural effusions suspected to be related to lupus. Fluid cultures from thoracentesis pending.   Follow-up Information    Follow up with [name], NP On 11/15/2015.   Specialty:  Pulmonary Disease   Why:  Appt at 9:00 AM    Contact information:   [address] [place] [address] [phone]       Follow up with [facility], MD.   Specialty:  Internal Medicine   Why:  As needed   Contact information:   [place] [address] [phone]       Follow up with [name], [name], MD.   Specialty:  Rheumatology   Why:  keep already scheduled appointment   Contact information:   [address] [place] [address] [phone]      Discharge Diagnoses:  1. Acute hypoxic respiratory failure secondary to recurrent bilateral pleural effusions 2. Bilateral pleural effusions, thought secondary to lupus 3. Lupus 4. Grade 2 diastolic congestive heart failure, compensated 5. Thrombocytopenia, unknown etiology  Discharge Condition: improved Disposition: home  Diet recommendation: heart healthy  Filed Weights   10/03/15 1342 10/04/15 0615 10/05/15 0503  Weight: 67.858 kg (149 lb 9.6 oz) 68 kg (149 lb 14.6 oz) 67.087 kg (147 lb 14.4 oz)    History of present illness:  70 year old woman with history of lupus, grade 2 diastolic dysfunction, recently discharged on 4/16 after an admission for dyspnea and chest tightness related to bilateral pleural effusions felt at that time to  be related to grade 2 diastolic heart failure and reported as transudative etiology. Patient underwent left thoracentesis with just over 1 L of fluid obtained. Pleural cytology with elevated LDH and no serum LDH obtained. Because of suboptimal blood pressure patient was not continued on Lasix at discharge. Presented 4/25 with increasing DOE, cough and chest tightness. Admitted for acute respiratory failure without hypoxia, bilateral pleural effusions.   Hospital Course:  She was seen by pulmonology with recommendations for thoracentesis. She underwent right and left therapeutic and diagnostic thoracentesis with resolution of dyspnea on exertion and hypoxia. She was started on steroids and Plaquenil which will continue on discharge. At this point infusions are favored to be secondary to lupus. She has outpatient follow-up arranged both with rheumatology and pulmonology. 1. Acute hypoxic respiratory failure secondary to recurrent bilateral pleural effusions. Resolved. Thought secondary to lupus. 2. Bilateral pleural effusions, as above. S/p left thoracentesis 4/25 and a right thoracentesis 4/26. Follow-up chest x-ray shows improvement. 3. Grade 2 diastolic CHF, compensated. No lower extremity edema. 4. Lupus off medical therapies >5 years. Now on prednisone and Plaquenil. Has outpatient follow-up with rheumatology next week. 5. Thrombocytopenia. Stable.  Consultants:  Pulmonology  Procedures:  4/25 left thoracentesis yielding 1.45 L clear yellow fluid  4/26 right thoracentesis Yielded 1 liter of clear yellow fluid.   Discharge Instructions  Discharge Instructions    Activity as tolerated - No restrictions    Complete by:  As directed      Diet - low sodium heart healthy    Complete by:  As directed      Discharge instructions  Complete by:  As directed   Call your physician or seek immediate medical attention for shortness of breath, fatigue, pain or worsening of condition.           Current Discharge Medication List    START taking these medications   Details  hydroxychloroquine (PLAQUENIL) 200 MG tablet Take 1 tablet (200 mg total) by mouth daily. Qty: 30 tablet, Refills: 0    predniSONE (DELTASONE) 20 MG tablet Take 3 tablets (60 mg total) by mouth daily with breakfast. Qty: 90 tablet, Refills: 0      CONTINUE these medications which have NOT CHANGED   Details  atorvastatin (LIPITOR) 20 MG tablet Take 20 mg by mouth daily.    guaiFENesin-dextromethorphan (ROBITUSSIN DM) 100-10 MG/5ML syrup Take 5 mLs by mouth every 4 (four) hours as needed for cough (over the counter). Qty: 118 mL, Refills: 0    omeprazole (PRILOSEC) 20 MG capsule Take 20 mg by mouth daily.      STOP taking these medications     ibuprofen (ADVIL,MOTRIN) 200 MG tablet        Allergies  Allergen Reactions  . Penicillins Shortness Of Breath    Has patient had a PCN reaction causing immediate rash, facial/tongue/throat swelling, SOB or lightheadedness with hypotension: Yes Has patient had a PCN reaction causing severe rash involving mucus membranes or skin necrosis: No Has patient had a PCN reaction that required hospitalization No Has patient had a PCN reaction occurring within the last 10 years: No If all of the above answers are "NO", then may proceed with Cephalosporin use.     The results of significant diagnostics from this hospitalization (including imaging, microbiology, ancillary and laboratory) are listed below for reference.    Significant Diagnostic Studies: Dg Chest 1 View  10/04/2015  CLINICAL DATA:  Post right thoracentesis, pleural effusion EXAM: CHEST 1 VIEW COMPARISON:  10/03/2015 FINDINGS: Decreasing right pleural effusion following thoracentesis. No pneumothorax. Small bilateral pleural effusions with bibasilar atelectasis. Heart is normal size. IMPRESSION: Bilateral pleural effusions with bibasilar atelectasis. Right effusion has decreased following thoracentesis.  No pneumothorax. Electronically Signed   By: Rolm Baptise M.D.   On: 10/04/2015 14:10   Dg Chest 1 View  10/03/2015  CLINICAL DATA:  Patient status post left-sided thoracentesis. EXAM: CHEST 1 VIEW COMPARISON:  Chest radiograph 10/03/2015. FINDINGS: Monitoring leads overlie the patient. Stable cardiac and mediastinal contours. Interval decrease in size of now small left pleural effusion. Persistent moderate right pleural effusion with underlying bibasilar consolidative pulmonary opacities. No pneumothorax. IMPRESSION: Interval decrease in size of now small left pleural effusion. Persistent moderate right effusion. Bibasilar opacities favored to represent atelectasis. Infection not excluded. Electronically Signed   By: Lovey Newcomer M.D.   On: 10/03/2015 17:21   Dg Chest 2 View  10/03/2015  CLINICAL DATA:  Shortness of breath, chest tightness, pleural effusions, lupus EXAM: CHEST  2 VIEW COMPARISON:  09/22/2015 FINDINGS: Moderate to large bilateral pleural effusions, larger than the 09/22/2015 comparison. Increased bibasilar compressive atelectasis/ consolidation. Upper lobes remain clear. Trachea is midline. Cardiac silhouette is obscured by the effusions. Aorta is atherosclerotic and mildly dilated. No acute osseous finding. IMPRESSION: Enlarging bilateral pleural effusions with bibasilar compressive atelectasis/ consolidation. Electronically Signed   By: Jerilynn Mages.  Shick M.D.   On: 10/03/2015 10:43   US Thoracentesis Asp Pleural Space W/img Guide  10/04/2015  INDICATION: Shortness of breath. Bilateral pleural effusions. Request for ultrasound guided diagnostic and therapeutic thoracentesis. EXAM: ULTRASOUND GUIDED RIGHT THORACENTESIS MEDICATIONS: 1% Lidocaine. COMPLICATIONS:  None immediate. PROCEDURE: An ultrasound guided thoracentesis was thoroughly discussed with the patient and questions answered. The benefits, risks, alternatives and complications were also discussed. The patient understands and wishes to  proceed with the procedure. Written consent was obtained. Ultrasound was performed to localize and mark an adequate pocket of fluid in the right chest. The area was then prepped and draped in the normal sterile fashion. 1% Lidocaine was used for local anesthesia. Under ultrasound guidance a 6 Fr Safe-T-Centesis catheter was introduced. Thoracentesis was performed. The catheter was removed and a dressing applied. FINDINGS: A total of approximately 1 liter of clear yellow fluid was removed. Samples were sent to the laboratory as requested by the clinical team. IMPRESSION: Successful ultrasound guided right thoracentesis yielding 1 liter of pleural fluid. Read by:  Gareth Eagle, PA-C Electronically Signed   By: Marybelle Killings M.D.   On: 10/04/2015 14:11   US Thoracentesis Asp Pleural Space W/img Guide  10/03/2015  INDICATION: Shortness of breath. Lupus. Bilateral pleural effusions. Request diagnostic and therapeutic thoracentesis. EXAM: ULTRASOUND GUIDED LEFT THORACENTESIS MEDICATIONS: None. COMPLICATIONS: None immediate. PROCEDURE: An ultrasound guided thoracentesis was thoroughly discussed with the patient and questions answered. The benefits, risks, alternatives and complications were also discussed. The patient understands and wishes to proceed with the procedure. Written consent was obtained. Ultrasound was performed to localize and mark an adequate pocket of fluid in the left chest. The area was then prepped and draped in the normal sterile fashion. 1% Lidocaine was used for local anesthesia. Under ultrasound guidance a Safe-T-Centesis catheter was introduced. Thoracentesis was performed. The catheter was removed and a dressing applied. FINDINGS: A total of approximately 1.45 L of clear yellow fluid was removed. Samples were sent to the laboratory as requested by the clinical team. IMPRESSION: Successful ultrasound guided left thoracentesis yielding 1.45 L of pleural fluid. Read by: Ascencion Dike PA-C  Electronically Signed   By: Markus Daft M.D.   On: 10/03/2015 16:54.  Microbiology: Recent Results (from the past 240 hour(s))  Culture, body fluid-bottle     Status: None (Preliminary result)   Collection Time: 10/03/15  5:22 PM  Result Value Ref Range Status   Specimen Description FLUID LEFT PLEURAL  Final   Special Requests BOTTLES DRAWN AEROBIC AND ANAEROBIC 10CC  Final   Culture NO GROWTH < 24 HOURS  Final   Report Status PENDING  Incomplete  Gram stain     Status: None   Collection Time: 10/03/15  5:22 PM  Result Value Ref Range Status   Specimen Description FLUID LEFT PLEURAL  Final   Special Requests NONE  Final   Gram Stain   Final    MODERATE WBC PRESENT, PREDOMINANTLY MONONUCLEAR NO ORGANISMS SEEN    Report Status 10/03/2015 FINAL  Final     Labs: Basic Metabolic Panel:  Recent Labs Lab 10/03/15 1009 10/04/15 0724  NA 140 140  K 3.9 3.9  CL 111 112*  CO2 22 22  GLUCOSE 99 89  BUN 15 17  CREATININE 0.89 0.88  CALCIUM 8.8* 8.2*   Liver Function Tests:  Recent Labs Lab 10/04/15 0724  AST 18  ALT 15  ALKPHOS 32*  BILITOT 0.3  PROT 5.6*  ALBUMIN 1.9*   CBC:  Recent Labs Lab 10/03/15 1009 10/04/15 0724  WBC 9.9 10.3  NEUTROABS 6.2  --   HGB 15.0 12.6  HCT 46.0 38.8  MCV 87.0 86.8  PLT 161 128*   Cardiac Enzymes:  Recent Labs Lab 10/03/15 1430  CKTOTAL 24*      Recent Labs  09/21/15 0733 10/03/15 1009  BNP 35.0 20.7      Principal Problem:   Bilateral pleural effusion (Recurrent) Active Problems:   Acute respiratory failure with hypoxia (HCC)   Left ventricular diastolic dysfunction, NYHA class 2   SLE (systemic lupus erythematosus) (HCC)   Recurrent pleural effusion on left   Pleural effusion, bilateral   SOB (shortness of breath)   Time coordinating discharge: 35 minutes  Signed:  Murray Hodgkins, MD [facility] 10/05/2015, 11:37 AM

## 2015-10-05 NOTE — Progress Notes (Signed)
SATURATION QUALIFICATIONS: (This note is used to comply with regulatory documentation for home oxygen)  Patient Saturations on Room Air at Rest = 98%  Patient Saturations on Room Air while Ambulating =96%  Please briefly explain why patient needs home oxygen: pt does not need o2 for home use.

## 2015-10-05 NOTE — Progress Notes (Signed)
PROGRESS NOTE  Jane Chavez [id number] DOB: Nov 18, 1945 DOA: 10/03/2015 PCP: [name], MD Outpatient Specialists: Patient Care Team: [name], MD as PCP - General (Internal Medicine) [name], MD as Consulting Physician (Rheumatology) [name], MD as Consulting Physician (Pulmonary Disease)  Brief Narrative: 70 year old woman with history of lupus, grade 2 diastolic dysfunction, recently discharged on 4/16 after an admission for dyspnea and chest tightness related to bilateral pleural effusions felt at that time to be related to grade 2 diastolic heart failure and reported as transudative etiology. Patient underwent left thoracentesis with just over 1 L of fluid obtained. Pleural cytology with elevated LDH and no serum LDH obtained. Because of suboptimal blood pressure patient was not continued on Lasix at discharge. Presented 4/25 with increasing DOE, cough and chest tightness. Admitted for acute respiratory failure without hypoxia, bilateral pleural effusions. She was seen by pulmonology with recommendations for thoracentesis. She underwent right and left therapeutic and diagnostic thoracentesis with resolution of dyspnea on exertion and hypoxia. She was started on steroids and Plaquenil which will continue on discharge. At this point infusions are favored to be secondary to lupus. She has outpatient follow-up arranged both with rheumatology and pulmonology.  Assessment/Plan: 1. Acute hypoxic respiratory failure secondary to recurrent bilateral pleural effusions. Resolved. Thought secondary to lupus. 2. Bilateral pleural effusions, as above. S/p left thoracentesis 4/25 and a right thoracentesis 4/26. Follow-up chest x-ray shows improvement. 3. Grade 2 diastolic CHF, compensated. No lower extremity edema. 4. Lupus off medical therapies >5 years. Now on prednisone and Plaquenil. Has outpatient follow-up with rheumatology next week. 5. Thrombocytopenia.  Stable.   Improved, now asymptomatic. Discussed with Dr. Elsworth Soho. Plan discharge home today on prednisone and Plaquenil with outpatient follow-up.  Murray Hodgkins, MD  [facility] Direct contact:  --Via amion app OR  --[email or web address]; password [email or web address] and click  [email or web address] contact night coverage as above 10/05/2015, 11:26 AM    Consultants:  Pulmonology  Procedures:  4/26 left thoracentesis yielding 1.45 L clear yellow fluid  Antimicrobials:    HPI/Subjective: Better, no DOE now.   Objective: Filed Vitals:   10/04/15 1346 10/04/15 1454 10/04/15 2318 10/05/15 0503  BP: 103/63 97/55 93/68  100/64  Pulse:  109 98 94  Temp:   98.5 F (36.9 C) 99.2 F (37.3 C)  TempSrc:   Oral Oral  Resp:  18 16 18   Height:      Weight:    67.087 kg (147 lb 14.4 oz)  SpO2:  92% 97% 98%    Intake/Output Summary (Last 24 hours) at 10/05/15 1126 Last data filed at 10/05/15 1015  Gross per 24 hour  Intake    480 ml  Output      0 ml  Net    480 ml     Filed Weights   10/03/15 1342 10/04/15 0615 10/05/15 0503  Weight: 67.858 kg (149 lb 9.6 oz) 68 kg (149 lb 14.6 oz) 67.087 kg (147 lb 14.4 oz)    Exam: Constitutional:  . Appears calm and comfortable Respiratory:  . CTA bilaterally, no w/r/r.  . Respiratory effort normal. No retractions or accessory muscle use Cardiovascular:  . RRR, no m/r/g . No LE extremity edema   Psychiatric:  . judgement and insight appear normal . Mental status o Mood, affect appropriate  I have personally reviewed following labs and imaging studies:  CXR 4/26 Bilateral pleural effusions, right pleural effusion has decreased in size following thoracentesis. Independently reviewed.  Scheduled Meds: . atorvastatin  20 mg Oral  Daily  . hydroxychloroquine  200 mg Oral Daily  . pantoprazole  40 mg Oral Daily  . predniSONE  60 mg Oral Q breakfast  . sodium chloride flush  3 mL Intravenous Q12H  . sodium chloride flush  3 mL Intravenous Q12H    Continuous Infusions:   Principal Problem:   Bilateral pleural effusion (Recurrent) Active Problems:   Acute respiratory failure with hypoxia (HCC)   Left ventricular diastolic dysfunction, NYHA class 2   SLE (systemic lupus erythematosus) (HCC)   Recurrent pleural effusion on left   Pleural effusion, bilateral   SOB (shortness of breath)

## 2015-10-05 NOTE — Progress Notes (Signed)
Name: Jane Chavez MRN: [id number] DOB: May 08, 1946    ADMISSION DATE:  10/03/2015 CONSULTATION DATE:  10/03/15  REFERRING MD :  Marily Memos  CHIEF COMPLAINT:  SOB   HISTORY OF PRESENT ILLNESS:  Jane Chavez is a 70 y.o. female with a PMH as outlined below including known dCHF (grade 2DD per echo from 09/22/15) as well as lupus (off meds for > 5 years).  She had recent admission 09/21/15 through 09/24/15 when she was hospitalized for dyspnea and bilateral effusions.  She had left thoracentesis 04/14 by IR at the time which yielded 1L of transudative fluid.  She also had right pleural effusion; however, when assess via bedside US, this was small and not large enough to warrant thoracentesis.  She had labile BP's on discharge; therefore, lasix was not continued on discharge.  On 10/03/15, she returned to ED with recurrent dyspnea, chest tightness, and coughing associated with deep breaths.  She has not been able to walk moe than 3 - 4 feet before she experienced significant dyspnea.  She did not report any weight gain or increase in peripheral edema.  CXR revealed enlarging bilateral pleural effusions along with bibasilar compressive atelectasis.   SUBJECTIVE Ready to go home  VITAL SIGNS: Temp:  [98.5 F (36.9 C)-99.2 F (37.3 C)] 99.2 F (37.3 C) (04/27 0503) Pulse Rate:  [94-109] 94 (04/27 0503) Resp:  [16-18] 18 (04/27 0503) BP: (93-103)/(55-68) 100/64 mmHg (04/27 0503) SpO2:  [92 %-98 %] 98 % (04/27 0503) Weight:  [147 lb 14.4 oz (67.087 kg)] 147 lb 14.4 oz (67.087 kg) (04/27 0503)  PHYSICAL EXAMINATION: General: Adult female, resting in chair, in NAD. Neuro: A&O x 3, non-focal.  HEENT: [place]/AT. PERRL, sclerae anicteric. Cardiovascular: RRR, no M/R/G.  Lungs: BS diminished bilateral bases with dullness to percussion. Abdomen: BS x 4, soft, NT/ND.  Musculoskeletal: No gross deformities, no edema.  Skin: Intact, warm, no rashes.     Recent Labs Lab 10/03/15 1009 10/04/15 0724   NA 140 140  K 3.9 3.9  CL 111 112*  CO2 22 22  BUN 15 17  CREATININE 0.89 0.88  GLUCOSE 99 89    Recent Labs Lab 10/03/15 1009 10/04/15 0724  HGB 15.0 12.6  HCT 46.0 38.8  WBC 9.9 10.3  PLT 161 128*   Dg Chest 1 View  10/04/2015  CLINICAL DATA:  Post right thoracentesis, pleural effusion EXAM: CHEST 1 VIEW COMPARISON:  10/03/2015 FINDINGS: Decreasing right pleural effusion following thoracentesis. No pneumothorax. Small bilateral pleural effusions with bibasilar atelectasis. Heart is normal size. IMPRESSION: Bilateral pleural effusions with bibasilar atelectasis. Right effusion has decreased following thoracentesis. No pneumothorax. Electronically Signed   By: Rolm Baptise M.D.   On: 10/04/2015 14:10   Dg Chest 1 View  10/03/2015  CLINICAL DATA:  Patient status post left-sided thoracentesis. EXAM: CHEST 1 VIEW COMPARISON:  Chest radiograph 10/03/2015. FINDINGS: Monitoring leads overlie the patient. Stable cardiac and mediastinal contours. Interval decrease in size of now small left pleural effusion. Persistent moderate right pleural effusion with underlying bibasilar consolidative pulmonary opacities. No pneumothorax. IMPRESSION: Interval decrease in size of now small left pleural effusion. Persistent moderate right effusion. Bibasilar opacities favored to represent atelectasis. Infection not excluded. Electronically Signed   By: Lovey Newcomer M.D.   On: 10/03/2015 17:21   Dg Chest 2 View  10/03/2015  CLINICAL DATA:  Shortness of breath, chest tightness, pleural effusions, lupus EXAM: CHEST  2 VIEW COMPARISON:  09/22/2015 FINDINGS: Moderate to large bilateral pleural effusions, larger than the  09/22/2015 comparison. Increased bibasilar compressive atelectasis/ consolidation. Upper lobes remain clear. Trachea is midline. Cardiac silhouette is obscured by the effusions. Aorta is atherosclerotic and mildly dilated. No acute osseous finding. IMPRESSION: Enlarging bilateral pleural effusions with  bibasilar compressive atelectasis/ consolidation. Electronically Signed   By: Jerilynn Mages.  Shick M.D.   On: 10/03/2015 10:43   US Thoracentesis Asp Pleural Space W/img Guide  10/04/2015  INDICATION: Shortness of breath. Bilateral pleural effusions. Request for ultrasound guided diagnostic and therapeutic thoracentesis. EXAM: ULTRASOUND GUIDED RIGHT THORACENTESIS MEDICATIONS: 1% Lidocaine. COMPLICATIONS: None immediate. PROCEDURE: An ultrasound guided thoracentesis was thoroughly discussed with the patient and questions answered. The benefits, risks, alternatives and complications were also discussed. The patient understands and wishes to proceed with the procedure. Written consent was obtained. Ultrasound was performed to localize and mark an adequate pocket of fluid in the right chest. The area was then prepped and draped in the normal sterile fashion. 1% Lidocaine was used for local anesthesia. Under ultrasound guidance a 6 Fr Safe-T-Centesis catheter was introduced. Thoracentesis was performed. The catheter was removed and a dressing applied. FINDINGS: A total of approximately 1 liter of clear yellow fluid was removed. Samples were sent to the laboratory as requested by the clinical team. IMPRESSION: Successful ultrasound guided right thoracentesis yielding 1 liter of pleural fluid. Read by:  Gareth Eagle, PA-C Electronically Signed   By: Marybelle Killings M.D.   On: 10/04/2015 14:11   US Thoracentesis Asp Pleural Space W/img Guide  10/03/2015  INDICATION: Shortness of breath. Lupus. Bilateral pleural effusions. Request diagnostic and therapeutic thoracentesis. EXAM: ULTRASOUND GUIDED LEFT THORACENTESIS MEDICATIONS: None. COMPLICATIONS: None immediate. PROCEDURE: An ultrasound guided thoracentesis was thoroughly discussed with the patient and questions answered. The benefits, risks, alternatives and complications were also discussed. The patient understands and wishes to proceed with the procedure. Written consent was  obtained. Ultrasound was performed to localize and mark an adequate pocket of fluid in the left chest. The area was then prepped and draped in the normal sterile fashion. 1% Lidocaine was used for local anesthesia. Under ultrasound guidance a Safe-T-Centesis catheter was introduced. Thoracentesis was performed. The catheter was removed and a dressing applied. FINDINGS: A total of approximately 1.45 L of clear yellow fluid was removed. Samples were sent to the laboratory as requested by the clinical team. IMPRESSION: Successful ultrasound guided left thoracentesis yielding 1.45 L of pleural fluid. Read by: Ascencion Dike PA-C Electronically Signed   By: Markus Daft M.D.   On: 10/03/2015 16:54    STUDIES:  CXR 04/25 > enlarging bilateral effusions with bibasilar compressive atelectasis.  SIGNIFICANT EVENTS  04/25 > IR repeat thoracentesis>> 1.5 L on Lt 4/26 >IR rt thora 1000 cc yellow fluid  ASSESSMENT / PLAN:  Recurrent bilateral pleural effusions -Pleural fluid results show low LDH but high protein and cells -would favor exudate Acute hypoxic respiratory failure - ersolved ? Component of lupus pneumonitis. Plan: Continue aggressive diuresis as BP and SCr permit. IR consulted for repeat thoracentesis - f/u on all routine labs which have already been sent. Continue supplemental O2 as needed to maintain SpO2 > 92%. Continue steroids. 4/26 rt thora with 1000 cc clear yellow fluid. TP 3.3, glu 116 Possible pulmonary opt follow up.   SLE - pt has apparently been off meds for > 5 years and had been doing well.  She was scheduled to follow up with outpatient rheum this week for further evaluation / management. Plan: F/u on ds-DNA, ANA. Continue prednisone for now. Consider restarting Plaquenil  that she was on in the past  needs outpatient f/u with rheum.  Answered all questions for patient and her family in detail

## 2015-10-08 LAB — CULTURE, BODY FLUID-BOTTLE

## 2015-10-08 LAB — CULTURE, BODY FLUID W GRAM STAIN -BOTTLE: Culture: NO GROWTH

## 2015-10-12 DIAGNOSIS — D8989 Other specified disorders involving the immune mechanism, not elsewhere classified: Secondary | ICD-10-CM | POA: Diagnosis not present

## 2015-10-12 DIAGNOSIS — M3500 Sicca syndrome, unspecified: Secondary | ICD-10-CM | POA: Diagnosis not present

## 2015-10-12 DIAGNOSIS — M159 Polyosteoarthritis, unspecified: Secondary | ICD-10-CM | POA: Diagnosis not present

## 2015-10-12 DIAGNOSIS — M329 Systemic lupus erythematosus, unspecified: Secondary | ICD-10-CM | POA: Diagnosis not present

## 2015-10-12 DIAGNOSIS — M3214 Glomerular disease in systemic lupus erythematosus: Secondary | ICD-10-CM | POA: Diagnosis not present

## 2015-10-12 DIAGNOSIS — R609 Edema, unspecified: Secondary | ICD-10-CM | POA: Diagnosis not present

## 2015-10-12 DIAGNOSIS — M3210 Systemic lupus erythematosus, organ or system involvement unspecified: Secondary | ICD-10-CM | POA: Diagnosis not present

## 2015-10-16 ENCOUNTER — Inpatient Hospital Stay: Payer: Medicare Other | Admitting: Acute Care

## 2015-10-19 DIAGNOSIS — E782 Mixed hyperlipidemia: Secondary | ICD-10-CM | POA: Diagnosis not present

## 2015-10-19 DIAGNOSIS — M329 Systemic lupus erythematosus, unspecified: Secondary | ICD-10-CM | POA: Diagnosis not present

## 2015-10-23 ENCOUNTER — Ambulatory Visit (INDEPENDENT_AMBULATORY_CARE_PROVIDER_SITE_OTHER)
Admission: RE | Admit: 2015-10-23 | Discharge: 2015-10-23 | Disposition: A | Discharge status: Home / Self Care | Payer: Medicare Other | Source: Ambulatory Visit | Attending: Acute Care | Admitting: Acute Care

## 2015-10-23 ENCOUNTER — Encounter: Payer: Self-pay | Admitting: Acute Care

## 2015-10-23 ENCOUNTER — Ambulatory Visit (INDEPENDENT_AMBULATORY_CARE_PROVIDER_SITE_OTHER): Payer: Medicare Other | Admitting: Acute Care

## 2015-10-23 VITALS — BP 112/72 | HR 99 | Ht 61.0 in | Wt 148.0 lb

## 2015-10-23 DIAGNOSIS — J90 Pleural effusion, not elsewhere classified: Secondary | ICD-10-CM | POA: Diagnosis not present

## 2015-10-23 NOTE — Assessment & Plan Note (Addendum)
Resolving Pleural Effusions: Plan: We will get a CXR today. We will call you with results. Continue taking your prednisone, lasix and  Plaquenil  as prescribed. Follow up with Rheumatologist as scheduled. Follow up with PCP as scheduled. Follow up with Dr. Chase Caller in 6 months Please contact office for sooner follow up if symptoms do not improve or worsen or seek emergency care

## 2015-10-23 NOTE — Progress Notes (Signed)
History of Present Illness Jane Chavez is a 70 y.o. female with Lupus off medical therapies >5 years. Now on prednisone and Plaquenil, grade II diastolic congestive heart failure, thrombocytopenia, and history of bilateral pleural effusions.   10/23/2015 Hospital Follow Up: Patient presents to the office for hospital follow-up of bilateral pleural effusions which were treated with bilateral thoracentesis.She had been off treatment for her Lupus for >5 years, is now on prednisone, lasix  and Plaquenil. She denies shortness of breath and states she is feeling much better.Denies chest pain, SOB, fever, leg or calf pain.She denies cough. She has noted swelling in her lower extremities. She is compliant with lasix treatment, prednisone, Plaquenil and Lipitor. She has followed up with Dr. Dossie Der, rheumatology, and is following up with her PCP next week. She is currently on 80 mg of prednisone daily which is being managed by Dr. Dossie Der..  Significant Events/Procedures:  Admit date: 10/03/2015 Discharge date: 10/05/2015  Principal Problem:  Bilateral pleural effusion (Recurrent) Active Problems:  Acute respiratory failure with hypoxia (HCC)  Left ventricular diastolic dysfunction, NYHA class 2  SLE (systemic lupus erythematosus) (HCC)  Recurrent pleural effusion on left  Pleural effusion, bilateral  SOB (shortness of breath)  Procedures:  1. 4/25 left thoracentesis yielding 1.45 L clear yellow fluid 2. 4/26 right thoracentesis Yielded 1 liter of clear yellow fluid. 3. 09/22/2015: Echo: 65-70% EF  10/23/2015: CXR: Small-to-moderate-sized bilateral pleural effusions slightly less than that seen on October 04 2015. There is no pneumothorax.  Past medical hx Past Medical History  Diagnosis Date  . Lupus ([place])   . GERD (gastroesophageal reflux disease)   . HLD (hyperlipidemia)   . Pleural effusion 09/2015     Past surgical hx, Family hx, Social hx all reviewed.  Current Outpatient  Prescriptions on File Prior to Visit  Medication Sig  . atorvastatin (LIPITOR) 20 MG tablet Take 20 mg by mouth daily.  . hydroxychloroquine (PLAQUENIL) 200 MG tablet Take 1 tablet (200 mg total) by mouth daily. (Patient taking differently: Take 400 mg by mouth daily. )  . omeprazole (PRILOSEC) 20 MG capsule Take 20 mg by mouth daily.  . predniSONE (DELTASONE) 20 MG tablet Take 3 tablets (60 mg total) by mouth daily with breakfast. (Patient taking differently: Take 80 mg by mouth daily with breakfast. )   No current facility-administered medications on file prior to visit.     Allergies  Allergen Reactions  . Penicillins Shortness Of Breath    Has patient had a PCN reaction causing immediate rash, facial/tongue/throat swelling, SOB or lightheadedness with hypotension: Yes Has patient had a PCN reaction causing severe rash involving mucus membranes or skin necrosis: No Has patient had a PCN reaction that required hospitalization No Has patient had a PCN reaction occurring within the last 10 years: No If all of the above answers are "NO", then may proceed with Cephalosporin use.     Review Of Systems:  Constitutional:   No  weight loss, night sweats,  Fevers, chills, fatigue, or  lassitude.  HEENT:   No headaches,  Difficulty swallowing,  Tooth/dental problems, or  Sore throat,                No sneezing, itching, ear ache, nasal congestion, post nasal drip,   CV:  No chest pain,  Orthopnea, PND,  + swelling in lower extremities, anasarca, dizziness, palpitations, syncope.   GI  No heartburn, indigestion, abdominal pain, nausea, vomiting, diarrhea, change in bowel habits, loss of appetite, bloody  stools.   Resp: No shortness of breath with exertion or at rest.  No excess mucus, no productive cough,  No non-productive cough,  No coughing up of blood.  No change in color of mucus.  No wheezing.  No chest wall deformity  Skin: no rash or lesions.  GU: no dysuria, change in color of  urine, no urgency or frequency.  No flank pain, no hematuria   MS:  No joint pain or swelling.  No decreased range of motion.  No back pain.  Psych:  No change in mood or affect. No depression or anxiety.  No memory loss.   Vital Signs BP 112/72 mmHg  Pulse 99  Ht 5\' 1"  (1.549 m)  Wt 148 lb (67.132 kg)  BMI 27.98 kg/m2  SpO2 96%   Physical Exam:  General- No distress,  A&Ox3, very pleasant ENT: No sinus tenderness, TM clear, pale nasal mucosa, no oral exudate,no post nasal drip, no LAN Cardiac: S1, S2, regular rate and rhythm, no murmur Chest: No wheeze/ + rales/ no dullness; no accessory muscle use, no nasal flaring, no sternal retractions Abd.: Soft Non-tender Ext: No clubbing cyanosis, 2+ edema lower extremities Neuro:  normal strength Skin: No rashes, warm and dry Psych: normal mood and behavior   Assessment/Plan  Pleural effusion, bilateral Resolving Pleural Effusions: Plan: We will get a CXR today. We will call you with results. Continue taking your prednisone, lasix and  Plaquenil  as prescribed. Follow up with Rheumatologist as scheduled. Follow up with PCP as scheduled. Follow up with Dr. Chase Caller in 6 months Please contact office for sooner follow up if symptoms do not improve or worsen or seek emergency care          [name], NP 10/23/2015  5:54 PM

## 2015-10-23 NOTE — Patient Instructions (Signed)
It is nice to meet you. I am glad you are feeling better. We will get a CXR today. We will call you with results. Continue taking your prednisone, lasix and  Plaquenil  as prescribed. Follow up with Dr. Chase Caller in 6 months Please contact office for sooner follow up if symptoms do not improve or worsen or seek emergency care

## 2015-10-26 DIAGNOSIS — M329 Systemic lupus erythematosus, unspecified: Secondary | ICD-10-CM | POA: Diagnosis not present

## 2015-11-02 DIAGNOSIS — E559 Vitamin D deficiency, unspecified: Secondary | ICD-10-CM | POA: Diagnosis not present

## 2015-11-02 DIAGNOSIS — M859 Disorder of bone density and structure, unspecified: Secondary | ICD-10-CM | POA: Diagnosis not present

## 2015-11-02 DIAGNOSIS — M3500 Sicca syndrome, unspecified: Secondary | ICD-10-CM | POA: Diagnosis not present

## 2015-11-02 DIAGNOSIS — M3214 Glomerular disease in systemic lupus erythematosus: Secondary | ICD-10-CM | POA: Diagnosis not present

## 2015-11-02 DIAGNOSIS — E8809 Other disorders of plasma-protein metabolism, not elsewhere classified: Secondary | ICD-10-CM | POA: Diagnosis not present

## 2015-11-02 DIAGNOSIS — M329 Systemic lupus erythematosus, unspecified: Secondary | ICD-10-CM | POA: Diagnosis not present

## 2015-11-02 DIAGNOSIS — R609 Edema, unspecified: Secondary | ICD-10-CM | POA: Diagnosis not present

## 2015-11-08 DIAGNOSIS — E8809 Other disorders of plasma-protein metabolism, not elsewhere classified: Secondary | ICD-10-CM | POA: Diagnosis not present

## 2015-11-13 ENCOUNTER — Ambulatory Visit: Payer: Medicare Other | Admitting: Acute Care

## 2015-11-15 ENCOUNTER — Inpatient Hospital Stay: Payer: Medicare Other | Admitting: Acute Care

## 2015-11-23 DIAGNOSIS — M1990 Unspecified osteoarthritis, unspecified site: Secondary | ICD-10-CM | POA: Diagnosis not present

## 2015-11-23 DIAGNOSIS — E559 Vitamin D deficiency, unspecified: Secondary | ICD-10-CM | POA: Diagnosis not present

## 2015-11-23 DIAGNOSIS — E782 Mixed hyperlipidemia: Secondary | ICD-10-CM | POA: Diagnosis not present

## 2015-11-23 DIAGNOSIS — M8580 Other specified disorders of bone density and structure, unspecified site: Secondary | ICD-10-CM | POA: Diagnosis not present

## 2015-11-23 DIAGNOSIS — M329 Systemic lupus erythematosus, unspecified: Secondary | ICD-10-CM | POA: Diagnosis not present

## 2015-11-23 DIAGNOSIS — M3500 Sicca syndrome, unspecified: Secondary | ICD-10-CM | POA: Diagnosis not present

## 2015-12-28 DIAGNOSIS — M8580 Other specified disorders of bone density and structure, unspecified site: Secondary | ICD-10-CM | POA: Diagnosis not present

## 2015-12-28 DIAGNOSIS — M1990 Unspecified osteoarthritis, unspecified site: Secondary | ICD-10-CM | POA: Diagnosis not present

## 2015-12-28 DIAGNOSIS — M3500 Sicca syndrome, unspecified: Secondary | ICD-10-CM | POA: Diagnosis not present

## 2015-12-28 DIAGNOSIS — M859 Disorder of bone density and structure, unspecified: Secondary | ICD-10-CM | POA: Diagnosis not present

## 2015-12-28 DIAGNOSIS — J90 Pleural effusion, not elsewhere classified: Secondary | ICD-10-CM | POA: Diagnosis not present

## 2015-12-28 DIAGNOSIS — M329 Systemic lupus erythematosus, unspecified: Secondary | ICD-10-CM | POA: Diagnosis not present

## 2016-02-16 DIAGNOSIS — M3500 Sicca syndrome, unspecified: Secondary | ICD-10-CM | POA: Diagnosis not present

## 2016-02-16 DIAGNOSIS — M329 Systemic lupus erythematosus, unspecified: Secondary | ICD-10-CM | POA: Diagnosis not present

## 2016-02-16 DIAGNOSIS — M1990 Unspecified osteoarthritis, unspecified site: Secondary | ICD-10-CM | POA: Diagnosis not present

## 2016-02-16 DIAGNOSIS — M8580 Other specified disorders of bone density and structure, unspecified site: Secondary | ICD-10-CM | POA: Diagnosis not present

## 2016-02-28 DIAGNOSIS — E559 Vitamin D deficiency, unspecified: Secondary | ICD-10-CM | POA: Diagnosis not present

## 2016-02-28 DIAGNOSIS — E782 Mixed hyperlipidemia: Secondary | ICD-10-CM | POA: Diagnosis not present

## 2016-03-06 DIAGNOSIS — E782 Mixed hyperlipidemia: Secondary | ICD-10-CM | POA: Diagnosis not present

## 2016-03-06 DIAGNOSIS — M150 Primary generalized (osteo)arthritis: Secondary | ICD-10-CM | POA: Diagnosis not present

## 2016-03-06 DIAGNOSIS — E559 Vitamin D deficiency, unspecified: Secondary | ICD-10-CM | POA: Diagnosis not present

## 2016-03-06 DIAGNOSIS — Z23 Encounter for immunization: Secondary | ICD-10-CM | POA: Diagnosis not present

## 2016-03-15 DIAGNOSIS — J90 Pleural effusion, not elsewhere classified: Secondary | ICD-10-CM | POA: Diagnosis not present

## 2016-03-18 DIAGNOSIS — M8580 Other specified disorders of bone density and structure, unspecified site: Secondary | ICD-10-CM | POA: Diagnosis not present

## 2016-03-18 DIAGNOSIS — M3500 Sicca syndrome, unspecified: Secondary | ICD-10-CM | POA: Diagnosis not present

## 2016-03-18 DIAGNOSIS — M1990 Unspecified osteoarthritis, unspecified site: Secondary | ICD-10-CM | POA: Diagnosis not present

## 2016-03-18 DIAGNOSIS — M329 Systemic lupus erythematosus, unspecified: Secondary | ICD-10-CM | POA: Diagnosis not present

## 2016-07-26 DIAGNOSIS — E782 Mixed hyperlipidemia: Secondary | ICD-10-CM | POA: Diagnosis not present

## 2016-08-06 DIAGNOSIS — E782 Mixed hyperlipidemia: Secondary | ICD-10-CM | POA: Diagnosis not present

## 2016-08-06 DIAGNOSIS — R0602 Shortness of breath: Secondary | ICD-10-CM | POA: Diagnosis not present

## 2016-08-06 DIAGNOSIS — R071 Chest pain on breathing: Secondary | ICD-10-CM | POA: Diagnosis not present

## 2016-08-06 DIAGNOSIS — R079 Chest pain, unspecified: Secondary | ICD-10-CM | POA: Diagnosis not present

## 2016-08-06 DIAGNOSIS — R0601 Orthopnea: Secondary | ICD-10-CM | POA: Diagnosis not present

## 2016-09-18 DIAGNOSIS — M329 Systemic lupus erythematosus, unspecified: Secondary | ICD-10-CM | POA: Diagnosis not present

## 2016-09-18 DIAGNOSIS — M3500 Sicca syndrome, unspecified: Secondary | ICD-10-CM | POA: Diagnosis not present

## 2016-09-18 DIAGNOSIS — M8580 Other specified disorders of bone density and structure, unspecified site: Secondary | ICD-10-CM | POA: Diagnosis not present

## 2016-09-18 DIAGNOSIS — R609 Edema, unspecified: Secondary | ICD-10-CM | POA: Diagnosis not present

## 2016-09-18 DIAGNOSIS — M1990 Unspecified osteoarthritis, unspecified site: Secondary | ICD-10-CM | POA: Diagnosis not present

## 2016-10-16 ENCOUNTER — Ambulatory Visit (INDEPENDENT_AMBULATORY_CARE_PROVIDER_SITE_OTHER)
Admission: RE | Admit: 2016-10-16 | Discharge: 2016-10-16 | Disposition: A | Discharge status: Home / Self Care | Payer: Medicare Other | Source: Ambulatory Visit | Attending: Internal Medicine | Admitting: Internal Medicine

## 2016-10-16 ENCOUNTER — Ambulatory Visit (INDEPENDENT_AMBULATORY_CARE_PROVIDER_SITE_OTHER): Payer: Medicare Other | Admitting: Internal Medicine

## 2016-10-16 ENCOUNTER — Telehealth: Payer: Self-pay | Admitting: Internal Medicine

## 2016-10-16 ENCOUNTER — Encounter: Payer: Self-pay | Admitting: Internal Medicine

## 2016-10-16 DIAGNOSIS — J90 Pleural effusion, not elsewhere classified: Secondary | ICD-10-CM

## 2016-10-16 NOTE — Progress Notes (Signed)
Subjective:     Patient ID: Jane Chavez, female   DOB: 1946/05/17, 71 y.o.   MRN: [id number]  HPI  Jane Chavez is a 71 y.o. female with Lupus off medical therapies >5 years. Now on prednisone and Plaquenil, grade II diastolic congestive heart failure, thrombocytopenia, and history of bilateral pleural effusions.   10/23/2015 Hospital Follow Up: Patient presents to the office for hospital follow-up of bilateral pleural effusions which were treated with bilateral thoracentesis.She had been off treatment for her Lupus for >5 years, is now on prednisone, lasix  and Plaquenil. She denies shortness of breath and states she is feeling much better.Denies chest pain, SOB, fever, leg or calf pain.She denies cough. She has noted swelling in her lower extremities. She is compliant with lasix treatment, prednisone, Plaquenil and Lipitor. She has followed up with Dr. Dossie Der, rheumatology, and is following up with her PCP next week. She is currently on 80 mg of prednisone daily which is being managed by Dr. Dossie Der..  Significant Events/Procedures:  Admit date: 10/03/2015 Discharge date: 10/05/2015  Principal Problem:  Bilateral pleural effusion (Recurrent) Active Problems:  Acute respiratory failure with hypoxia (HCC)  Left ventricular diatolic dysfunction, NYHA class 2  SLE (systemic lupus erythematosus) (HCC)  Recurrent pleural effusion on left  Pleural effusion, bilateral  SOB (shortness of breath)  Procedures:  1. 4/25 left thoracentesis yielding 1.45 L clear yellow fluid 2. 4/26 right thoracentesis Yielded 1 liter of clear yellow fluid. 3. 09/22/2015: Echo: 65-70% EF  10/23/2015: CXR: Small-to-moderate-sized bilateral pleural effusions slightly less than that seen on October 04 2015. There is no pneumothorax.   OV 10/16/2016  Chief Complaint  Patient presents with  . Follow-up    pt states breathing is ok but she does get a little winded here and there chest xray showed fluid on her  lungs so Dr. Dorathy Daft told her to see her pulmonologist   71 year old African-American female who I'm seeing for the first time. Last seen in our office just over a year ago. Based on review of the chart and her history she tells me that she is living [place] and was diagnosed with lupus of the joints and skin rash over 10 years ago. Some 6 years ago she moved to [place]. She was already on chronic prednisone. Some 4 years ago she establish with [facility] rheumatology Department. Since then she's been on Plaquenil. Then approximately in April 2017 started developing bilateral pleural effusions but review of the chart shows transudate and nondiagnostic cytology on 3 occasions all in April 2017. She's been maintained on Lasix with potassium. This has kept her under control. Then she reports that earlier this year she saw her Medicare physician Dr. Ashby Dawes and chest x-ray suggested pleural effusion. Indicates approximately a month ago. The same thing is some pleural effusion. She only has mild dyspnea on extreme exertion. She's been referred back to pulmonary. I do not have the most recent chest x-ray from primary care physician's office. But I did  visualize all the 2017 films and I concur with the report. Other than that she feels well.    has a past medical history of GERD (gastroesophageal reflux disease); HLD (hyperlipidemia); Lupus; and Pleural effusion (09/2015).   reports that she has never smoked. She has never used smokeless tobacco.  Past Surgical History:  Procedure Laterality Date  . BREAST LUMPECTOMY    . REPLACEMENT TOTAL KNEE     Right    Allergies  Allergen Reactions  .  Penicillins Shortness Of Breath    Has patient had a PCN reaction causing immediate rash, facial/tongue/throat swelling, SOB or lightheadedness with hypotension: Yes Has patient had a PCN reaction causing severe rash involving mucus membranes or skin necrosis: No Has patient had a PCN  reaction that required hospitalization No Has patient had a PCN reaction occurring within the last 10 years: No If all of the above answers are "NO", then may proceed with Cephalosporin use.      There is no immunization history on file for this patient.  No family history on file.   Current Outpatient Prescriptions:  .  Calcium Carb-Cholecalciferol (CALCIUM 500 +D) 500-400 MG-UNIT TABS, Take by mouth., Disp: , Rfl:  .  furosemide (LASIX) 80 MG tablet, Take 40 mg by mouth daily., Disp: , Rfl: 0 .  hydroxychloroquine (PLAQUENIL) 200 MG tablet, Take 1 tablet (200 mg total) by mouth daily. (Patient taking differently: Take 400 mg by mouth daily. ), Disp: 30 tablet, Rfl: 0 .  lisinopril (PRINIVIL,ZESTRIL) 20 MG tablet, Take 20 mg by mouth daily., Disp: , Rfl:  .  potassium chloride SA (K-DUR,KLOR-CON) 20 MEQ tablet, Take 20 mEq by mouth daily., Disp: , Rfl: 0 .  predniSONE (DELTASONE) 10 MG tablet, Take 10 mg by mouth daily with breakfast., Disp: , Rfl:    Review of Systems     Objective:   Physical Exam  Constitutional: She is oriented to person, place, and time. She appears well-developed and well-nourished. No distress.  HENT:  Head: Normocephalic and atraumatic.  Right Ear: External ear normal.  Left Ear: External ear normal.  Mouth/Throat: Oropharynx is clear and moist. No oropharyngeal exudate.  Eyes: Conjunctivae and EOM are normal. Pupils are equal, round, and reactive to light. Right eye exhibits no discharge. Left eye exhibits no discharge. No scleral icterus.  Neck: Normal range of motion. Neck supple. No JVD present. No tracheal deviation present. No thyromegaly present.  Cardiovascular: Normal rate, regular rhythm, normal heart sounds and intact distal pulses.  Exam reveals no gallop and no friction rub.   No murmur heard. Pulmonary/Chest: Effort normal and breath sounds normal. No respiratory distress. She has no wheezes. She has no rales. She exhibits no tenderness.   Abdominal: Soft. Bowel sounds are normal. She exhibits no distension and no mass. There is no tenderness. There is no rebound and no guarding.  Musculoskeletal: Normal range of motion. She exhibits no edema or tenderness.  Lymphadenopathy:    She has no cervical adenopathy.  Neurological: She is alert and oriented to person, place, and time. She has normal reflexes. No cranial nerve deficit. She exhibits normal muscle tone. Coordination normal.  Skin: Skin is warm and dry. No rash noted. She is not diaphoretic. No erythema. No pallor.  Psychiatric: She has a normal mood and affect. Her behavior is normal. Judgment and thought content normal.  Vitals reviewed.  Vitals:   10/16/16 1046  BP: 120/74  Pulse: 84  SpO2: 98%  Weight: 157 lb (71.2 kg)  Height: 5\' 1"  (1.549 m)    Estimated body mass index is 29.66 kg/m as calculated from the following:   Height as of this encounter: 5\' 1"  (1.549 m).   Weight as of this encounter: 157 lb (71.2 kg).     Assessment:       ICD-9-CM ICD-10-CM   1. Bilateral pleural effusion (Recurrent) 511.9 J90 DG Chest 2 View       Plan:     Bilateral pleural effusion (Recurrent) PLAN -  Do cxr 2 view 10/16/2016  - will call with results to decide next step that might involve repeat tap ordiscussions with Dr Dorathy Daft about treating your SLE more aggresively or doing other tests   (> 50% of this 15 min visit spent in face to face counseling or/and coordination of care)  Dr. Brand Males, M.D., [facility].C.P Pulmonary and Critical Care Medicine Staff Physician [place] [facility] Pager: [phone], If no answer or between  15:00h - 7:00h: call [phone]  10/16/2016 11:29 AM

## 2016-10-16 NOTE — Assessment & Plan Note (Signed)
PLAN - Do cxr 2 view 10/16/2016  - will call with results to decide next step that might involve repeat tap ordiscussions with Dr Dorathy Daft about treating your SLE more aggresively or doing other tests

## 2016-10-16 NOTE — Patient Instructions (Signed)
Bilateral pleural effusion (Recurrent) PLAN - Do cxr 2 view 10/16/2016  - will call with results to decide next step that might involve repeat tap ordiscussions with Dr Dorathy Daft about treating your SLE more aggresively or doing other tests

## 2016-10-16 NOTE — Telephone Encounter (Signed)
Daneil Dan  Let [name] know that there is still some effusion on left side. Small. We can certainly put a needle - radiology - to try to analyze if is different from last year. Is possible she goes there and they do not have enough to tap but is worth a visit  Plan The fluid removal will be done by Interventional radiology   - they will remove not more than 500cc  - fluid labs to be sent: cell count, gram stain and culture, cytology for malignant cells, chemistries for LDH, albumin,  Protein, glucose, triglyceride and lipase  -  Risks  - Risks of pneumothorax, hemothorax, and  Benefits of diagnosis but limitations of non-diagnosis also explained.    After thora - return to see TP/SG - 5 days after procedure  Dr. Brand Males, M.D., [facility].C.P Pulmonary and Critical Care Medicine Staff Physician [place] [facility] Pager: [phone], If no answer or between  15:00h - 7:00h: call [phone]  10/16/2016 5:18 PM      .

## 2016-10-18 NOTE — Telephone Encounter (Signed)
Will forward to elise to follow up on results to the pt.

## 2016-10-18 NOTE — Telephone Encounter (Signed)
Pt calling for results.Jane Chavez ° °

## 2016-10-18 NOTE — Telephone Encounter (Signed)
lmtcb for pt.  

## 2016-10-21 NOTE — Telephone Encounter (Signed)
Called and spoke to pt. Informed her of the results and recs per MR. Order placed. Pt verbalized understanding and denied any further questions or concerns at this time.    Will keep in my box to f/u on to schedule f/u visit.

## 2016-10-22 NOTE — Telephone Encounter (Signed)
Nothing further needed at this time. Will sign off.  

## 2016-10-22 NOTE — Telephone Encounter (Signed)
I scheduled pt for thorancentesis on 5/23 & made her appt with TP on 5/29.

## 2016-10-24 ENCOUNTER — Ambulatory Visit (HOSPITAL_COMMUNITY): Payer: Medicare Other

## 2016-10-30 ENCOUNTER — Ambulatory Visit (HOSPITAL_COMMUNITY)
Admission: RE | Admit: 2016-10-30 | Discharge: 2016-10-30 | Disposition: A | Discharge status: Home / Self Care | Payer: Medicare Other | Source: Ambulatory Visit | Attending: Internal Medicine | Admitting: Internal Medicine

## 2016-10-30 ENCOUNTER — Ambulatory Visit (HOSPITAL_COMMUNITY)
Admission: RE | Admit: 2016-10-30 | Discharge: 2016-10-30 | Disposition: A | Discharge status: Home / Self Care | Payer: Medicare Other | Source: Ambulatory Visit | Attending: Radiology | Admitting: Radiology

## 2016-10-30 DIAGNOSIS — I700 Atherosclerosis of aorta: Secondary | ICD-10-CM | POA: Insufficient documentation

## 2016-10-30 DIAGNOSIS — R846 Abnormal cytological findings in specimens from respiratory organs and thorax: Secondary | ICD-10-CM | POA: Diagnosis not present

## 2016-10-30 DIAGNOSIS — D72820 Lymphocytosis (symptomatic): Secondary | ICD-10-CM | POA: Diagnosis not present

## 2016-10-30 DIAGNOSIS — J90 Pleural effusion, not elsewhere classified: Secondary | ICD-10-CM | POA: Diagnosis not present

## 2016-10-30 DIAGNOSIS — Z9889 Other specified postprocedural states: Secondary | ICD-10-CM | POA: Diagnosis not present

## 2016-10-30 DIAGNOSIS — J9811 Atelectasis: Secondary | ICD-10-CM | POA: Diagnosis not present

## 2016-10-30 DIAGNOSIS — J918 Pleural effusion in other conditions classified elsewhere: Secondary | ICD-10-CM | POA: Diagnosis not present

## 2016-10-30 LAB — ALBUMIN, PLEURAL OR PERITONEAL FLUID: Albumin, Fluid: 2.7 g/dL

## 2016-10-30 LAB — BODY FLUID CELL COUNT WITH DIFFERENTIAL
Lymphs, Fluid: 82 %
Monocyte-Macrophage-Serous Fluid: 8 % — ABNORMAL LOW (ref 50–90)
Neutrophil Count, Fluid: 6 % (ref 0–25)
Total Nucleated Cell Count, Fluid: 1192 cu mm — ABNORMAL HIGH (ref 0–1000)

## 2016-10-30 LAB — PROTEIN, PLEURAL OR PERITONEAL FLUID: Total protein, fluid: 4.1 g/dL

## 2016-10-30 LAB — LACTATE DEHYDROGENASE, PLEURAL OR PERITONEAL FLUID: LD, Fluid: 106 U/L — ABNORMAL HIGH (ref 3–23)

## 2016-10-30 LAB — GLUCOSE, PLEURAL OR PERITONEAL FLUID: Glucose, Fluid: 86 mg/dL

## 2016-10-30 NOTE — Procedures (Signed)
PROCEDURE SUMMARY:  Successful US guided left thoracentesis. Yielded 330 mL of clear yellow fluid. Pt tolerated procedure well. No immediate complications.  Specimen was sent for labs. CXR ordered.  Ascencion Dike PA-C 10/30/2016 10:53 AM

## 2016-10-31 LAB — TRIGLYCERIDES, BODY FLUIDS: Triglycerides, Fluid: 24 mg/dL

## 2016-11-01 ENCOUNTER — Telehealth: Payer: Self-pay | Admitting: Internal Medicine

## 2016-11-01 NOTE — Telephone Encounter (Signed)
Fluid is non diagnostic and transudate - ensure fu Will be seeing tP       Dr. Brand Males, M.D., [facility].C.P Pulmonary and Critical Care Medicine Staff Physician [place] [facility] Pager: [phone], If no answer or between  15:00h - 7:00h: call [phone]  11/01/2016 4:51 PM

## 2016-11-01 NOTE — Telephone Encounter (Signed)
Pt is following up with TP on 5/29.  Forwarding to MR as FYI.

## 2016-11-02 LAB — BODY FLUID CULTURE: Culture: NO GROWTH

## 2016-11-03 LAB — LIPASE, FLUID: Lipase-Fluid: 13 U/L

## 2016-11-05 ENCOUNTER — Ambulatory Visit (INDEPENDENT_AMBULATORY_CARE_PROVIDER_SITE_OTHER)
Admission: RE | Admit: 2016-11-05 | Discharge: 2016-11-05 | Disposition: A | Discharge status: Home / Self Care | Payer: Medicare Other | Source: Ambulatory Visit | Attending: Adult Health | Admitting: Adult Health

## 2016-11-05 ENCOUNTER — Ambulatory Visit (INDEPENDENT_AMBULATORY_CARE_PROVIDER_SITE_OTHER): Payer: Medicare Other | Admitting: Adult Health

## 2016-11-05 ENCOUNTER — Encounter: Payer: Self-pay | Admitting: Adult Health

## 2016-11-05 VITALS — BP 118/82 | HR 80 | Resp 18 | Ht 62.0 in | Wt 154.6 lb

## 2016-11-05 DIAGNOSIS — J918 Pleural effusion in other conditions classified elsewhere: Secondary | ICD-10-CM | POA: Diagnosis not present

## 2016-11-05 DIAGNOSIS — M329 Systemic lupus erythematosus, unspecified: Secondary | ICD-10-CM | POA: Diagnosis not present

## 2016-11-05 DIAGNOSIS — J90 Pleural effusion, not elsewhere classified: Secondary | ICD-10-CM | POA: Diagnosis not present

## 2016-11-05 NOTE — Assessment & Plan Note (Signed)
Cont follow up with Rheumatology .

## 2016-11-05 NOTE — Patient Instructions (Signed)
Continue on current regimen  Chest xray today  Follow up with Rheumatology next month as planned follow up Dr. Chase Caller in 3 months and As needed

## 2016-11-05 NOTE — Assessment & Plan Note (Signed)
Transudative left pleural effusion -recurrent  She does have Diastlic CHF and Lupus  Appears well controlled on Lasix without evidence of fluid overload.  Check CXR today for reaccumulation  She is to folllow up with Rheumatology next month to see if more aggressive lupus control is indicated.   Plan  Patient Instructions  Continue on current regimen  Chest xray today  Follow up with Rheumatology next month as planned follow up Dr. Chase Caller in 3 months and As needed

## 2016-11-05 NOTE — Progress Notes (Signed)
@Patient  ID: Jane Chavez, female    DOB: 1946-04-26, 71 y.o.   MRN: [id number]  Chief Complaint  Patient presents with  . Follow-up    Referring provider: [name], MD  HPI: 71 year old female never smoker with lupus seen for pulmonary consult during hospitalization 2017 for bilateral effusions. Patient has grade 2 diastolic congestive heart failure  11/05/2016 Follow up : Pleural effusions Patient returns for a two-week follow-up. Patient was seen in the office for evaluation of bilateral pleural effusions. Patient was seen in during hospitalization. April 2017 for bilateral pleural effusions. Patient underwent thoracentesis twice in April 2017. Was felt to be transudate of effusions. Patient was felt this may be secondary to her lupus. And was referred to rheumatology. She is followed by rheumatology and is currently on prednisone 10 mg and Plaquenil.  Echo April 2017 showed normal EF and grade 2 diastolic dysfunction. Patient is on Lasix 80 mg daily .Seen by PCP last month told bnp was nml .  Last visit. Patient was set up for a thoracentesis or left pleural effusion with 330 cc of pleural fluid removed. It appeared to be transudate. Cytology was negative for malignant cells..    Allergies  Allergen Reactions  . Penicillins Shortness Of Breath    Has patient had a PCN reaction causing immediate rash, facial/tongue/throat swelling, SOB or lightheadedness with hypotension: Yes Has patient had a PCN reaction causing severe rash involving mucus membranes or skin necrosis: No Has patient had a PCN reaction that required hospitalization No Has patient had a PCN reaction occurring within the last 10 years: No If all of the above answers are "NO", then may proceed with Cephalosporin use.     Immunization History  Administered Date(s) Administered  . Influenza,inj,Quad PF,36+ Mos 04/10/2016    Past Medical History:  Diagnosis Date  . GERD (gastroesophageal reflux  disease)   . HLD (hyperlipidemia)   . Lupus   . Pleural effusion 09/2015    Tobacco History: History  Smoking Status  . Never Smoker  Smokeless Tobacco  . Never Used   Counseling given: Not Answered   Outpatient Encounter Prescriptions as of 11/05/2016  Medication Sig  . Calcium Carb-Cholecalciferol (CALCIUM 500 +D) 500-400 MG-UNIT TABS Take by mouth.  . furosemide (LASIX) 80 MG tablet Take 40 mg by mouth daily.  . hydroxychloroquine (PLAQUENIL) 200 MG tablet Take 1 tablet (200 mg total) by mouth daily. (Patient taking differently: Take 400 mg by mouth daily. )  . lisinopril (PRINIVIL,ZESTRIL) 20 MG tablet Take 20 mg by mouth daily.  . potassium chloride SA (K-DUR,KLOR-CON) 20 MEQ tablet Take 20 mEq by mouth daily.  . predniSONE (DELTASONE) 10 MG tablet Take 10 mg by mouth daily with breakfast.   No facility-administered encounter medications on file as of 11/05/2016.      Review of Systems  Constitutional:   No  weight loss, night sweats,  Fevers, chills,  +fatigue, or  lassitude.  HEENT:   No headaches,  Difficulty swallowing,  Tooth/dental problems, or  Sore throat,                No sneezing, itching, ear ache, nasal congestion, post nasal drip,   CV:  No chest pain,  Orthopnea, PND, swelling in lower extremities, anasarca, dizziness, palpitations, syncope.   GI  No heartburn, indigestion, abdominal pain, nausea, vomiting, diarrhea, change in bowel habits, loss of appetite, bloody stools.   Resp: No shortness of breath with exertion or at rest.  No excess mucus,  no productive cough,  No non-productive cough,  No coughing up of blood.  No change in color of mucus.  No wheezing.  No chest wall deformity  Skin: no rash or lesions.  GU: no dysuria, change in color of urine, no urgency or frequency.  No flank pain, no hematuria   MS:   Chronic joint pain   Physical Exam  BP 118/82 (BP Location: Left Arm, Cuff Size: Large)   Pulse 80   Resp 18   Ht 5\' 2"  (1.575 m)    Wt 154 lb 9.6 oz (70.1 kg)   SpO2 99%   BMI 28.28 kg/m   GEN: A/Ox3; pleasant , NAD, well nourished    HEENT:  [place]/AT,  EACs-clear, TMs-wnl, NOSE-clear, THROAT-clear, no lesions, no postnasal drip or exudate noted.   NECK:  Supple w/ fair ROM; no JVD; normal carotid impulses w/o bruits; no thyromegaly or nodules palpated; no lymphadenopathy.    RESP  Clear  P & A; w/o, wheezes/ rales/ or rhonchi. no accessory muscle use, no dullness to percussion  CARD:  RRR, no m/r/g, no peripheral edema, pulses intact, no cyanosis or clubbing.  GI:   Soft & nt; nml bowel sounds; no organomegaly or masses detected.   Musco: Warm bil, no deformities or joint swelling noted.   Neuro: alert, no focal deficits noted.    Skin: Warm, no lesions or rashes    Lab Results:  BNP  ProBNP No results found for: PROBNP  Imaging: Dg Chest 1 View  Result Date: 10/30/2016 CLINICAL DATA:  Status post left thoracentesis EXAM: CHEST 1 VIEW COMPARISON:  10/16/2016 chest radiograph. FINDINGS: Stable cardiomediastinal silhouette with normal heart size and aortic atherosclerosis. No pneumothorax. No right pleural effusion. Small left pleural effusion, decreased. No pulmonary edema. Mild left basilar atelectasis, decreased. No acute consolidative airspace disease. IMPRESSION: 1. No pneumothorax . 2. Small left pleural effusion, decreased. 3. Mild left basilar atelectasis, decreased. 4. Aortic atherosclerosis. Electronically Signed   By: Ilona Sorrel M.D.   On: 10/30/2016 11:07   Dg Chest 2 View  Result Date: 10/16/2016 CLINICAL DATA:  Pleural effusion EXAM: CHEST  2 VIEW COMPARISON:  August 06, 2016 and March 15, 2016 FINDINGS: There is a persistent lateral left pleural thickening with questionable effusion and associated left base atelectasis. Lungs elsewhere are clear. Heart size and pulmonary vascularity are normal. No adenopathy. No bone lesions. IMPRESSION: The appearance in the left base remains stable with  either chronic pleural thickening/ scarring or small chronic pleural effusion. There is associated left base atelectasis, stable. Lungs elsewhere clear. Stable cardiac silhouette. No adenopathy. It may be prudent to consider left lateral decubitus chest radiograph to assess for free-flowing pleural effusion on the left. Electronically Signed   By: Lowella Grip III M.D.   On: 10/16/2016 11:47   US Thoracentesis Asp Pleural Space W/img Guide  Result Date: 10/30/2016 INDICATION: Symptomatic left-sided pleural effusion. Request diagnostic and therapeutic thoracentesis. EXAM: ULTRASOUND GUIDED LEFT THORACENTESIS MEDICATIONS: None. COMPLICATIONS: None immediate. Postprocedural chest x-ray negative for pneumothorax. PROCEDURE: An ultrasound guided thoracentesis was thoroughly discussed with the patient and questions answered. The benefits, risks, alternatives and complications were also discussed. The patient understands and wishes to proceed with the procedure. Written consent was obtained. Ultrasound was performed to localize and mark an adequate pocket of fluid in the left chest. The area was then prepped and draped in the normal sterile fashion. 1% Lidocaine was used for local anesthesia. Under ultrasound guidance a Safe-T-Centesis catheter was  introduced. Thoracentesis was performed. The catheter was removed and a dressing applied. FINDINGS: A total of approximately 330 mL of clear yellow fluid was removed. Samples were sent to the laboratory as requested by the clinical team. IMPRESSION: Successful ultrasound guided left thoracentesis yielding 330 mL of pleural fluid. Read by: Ascencion Dike PA-C Electronically Signed   By: Sandi Mariscal M.D.   On: 10/30/2016 14:15     Assessment & Plan:   Bilateral pleural effusion (Recurrent) Transudative left pleural effusion -recurrent  She does have Diastlic CHF and Lupus  Appears well controlled on Lasix without evidence of fluid overload.  Check CXR today for  reaccumulation  She is to folllow up with Rheumatology next month to see if more aggressive lupus control is indicated.   Plan  Patient Instructions  Continue on current regimen  Chest xray today  Follow up with Rheumatology next month as planned follow up Dr. Chase Caller in 3 months and As needed      SLE (systemic lupus erythematosus) ([place]) Cont follow up with Rheumatology .      Rexene Edison, NP 11/05/2016

## 2016-12-05 IMAGING — CR DG CHEST 2V
2 series · 2 of 2 positions shown · non-contrast
Comparison: Portable chest x-ray September 21, 2015 and chest CT
scan of same day.

CLINICAL DATA: Bilateral pleural effusions, symptomatic Kessel
improved.

EXAM:
CHEST  2 VIEW

[chest pa]
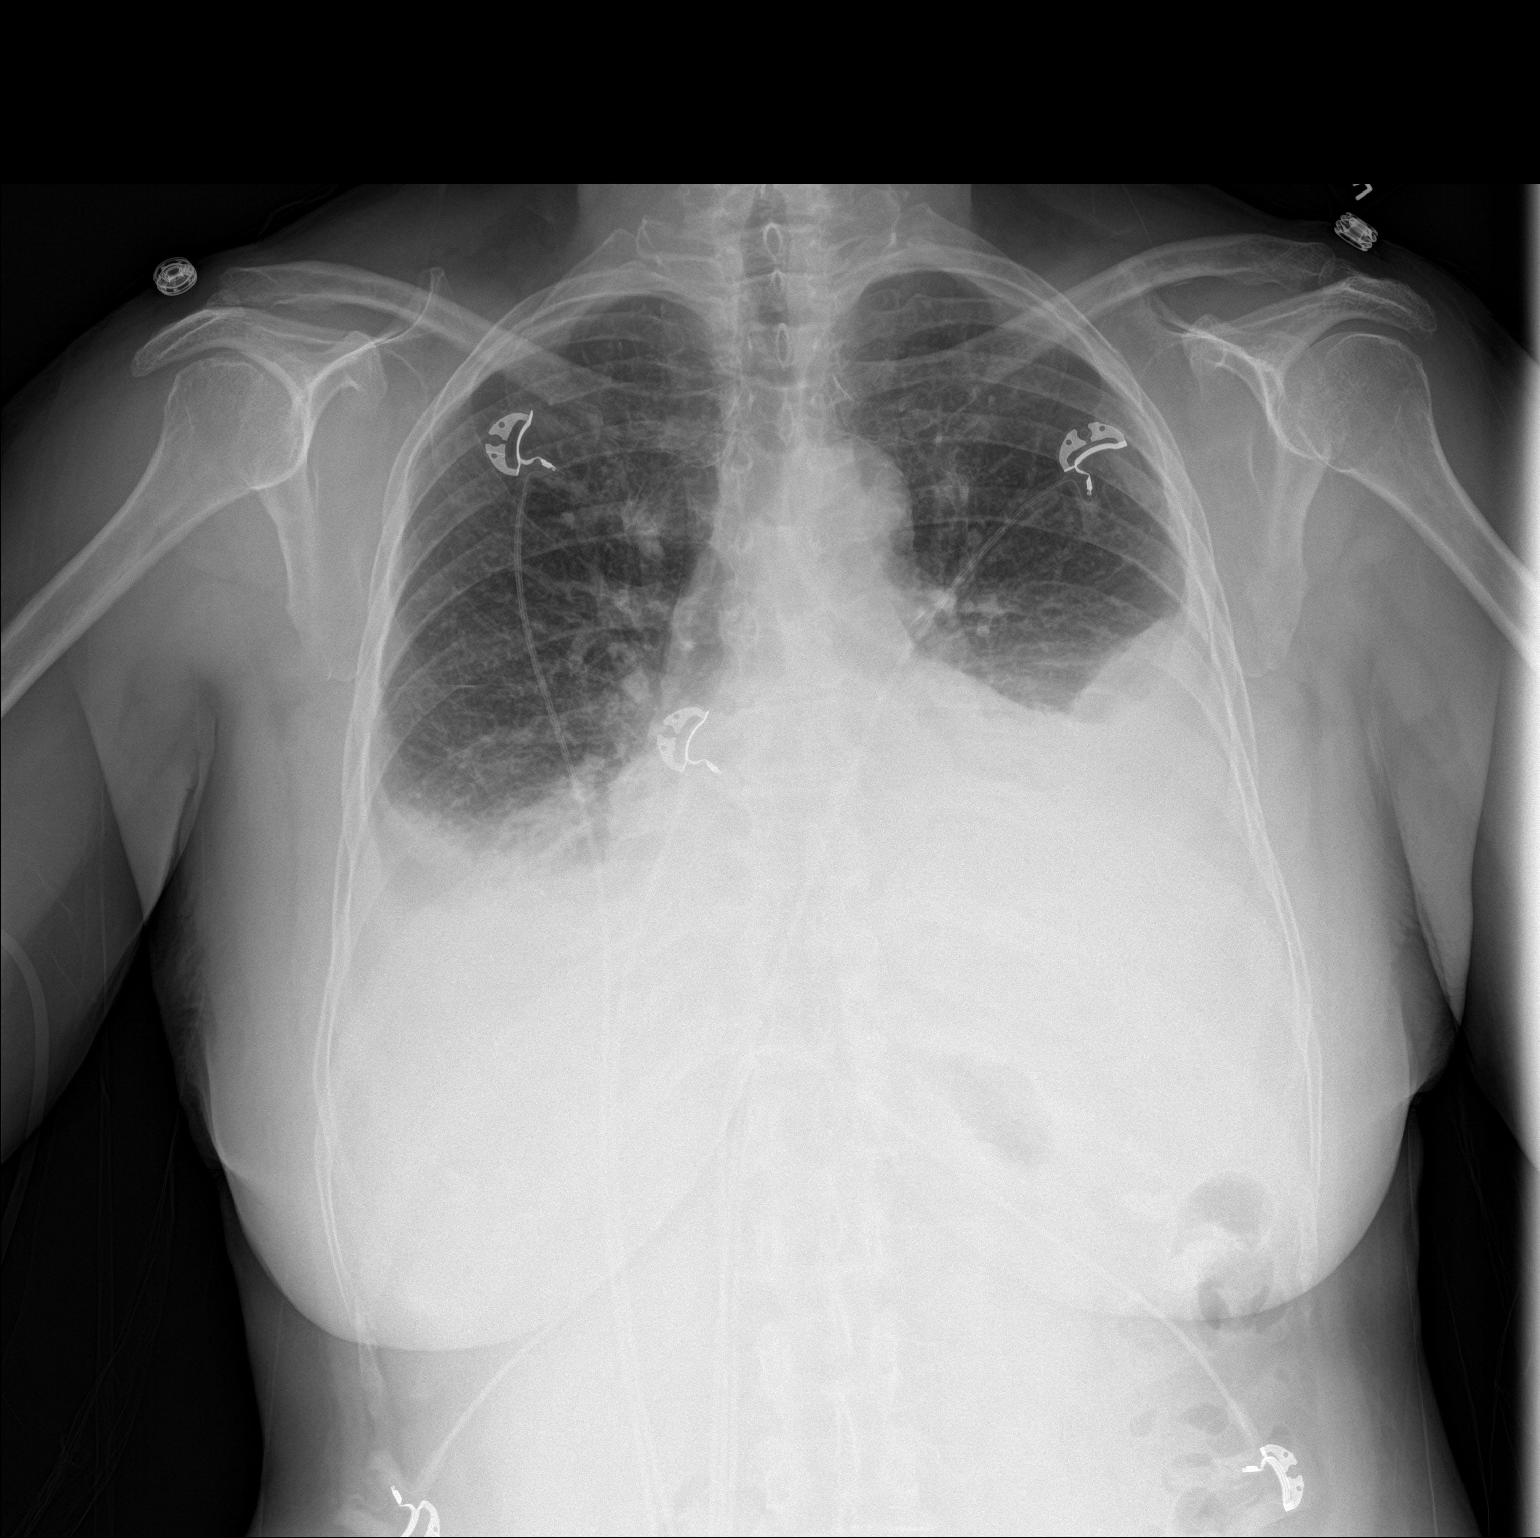

[chest lat]
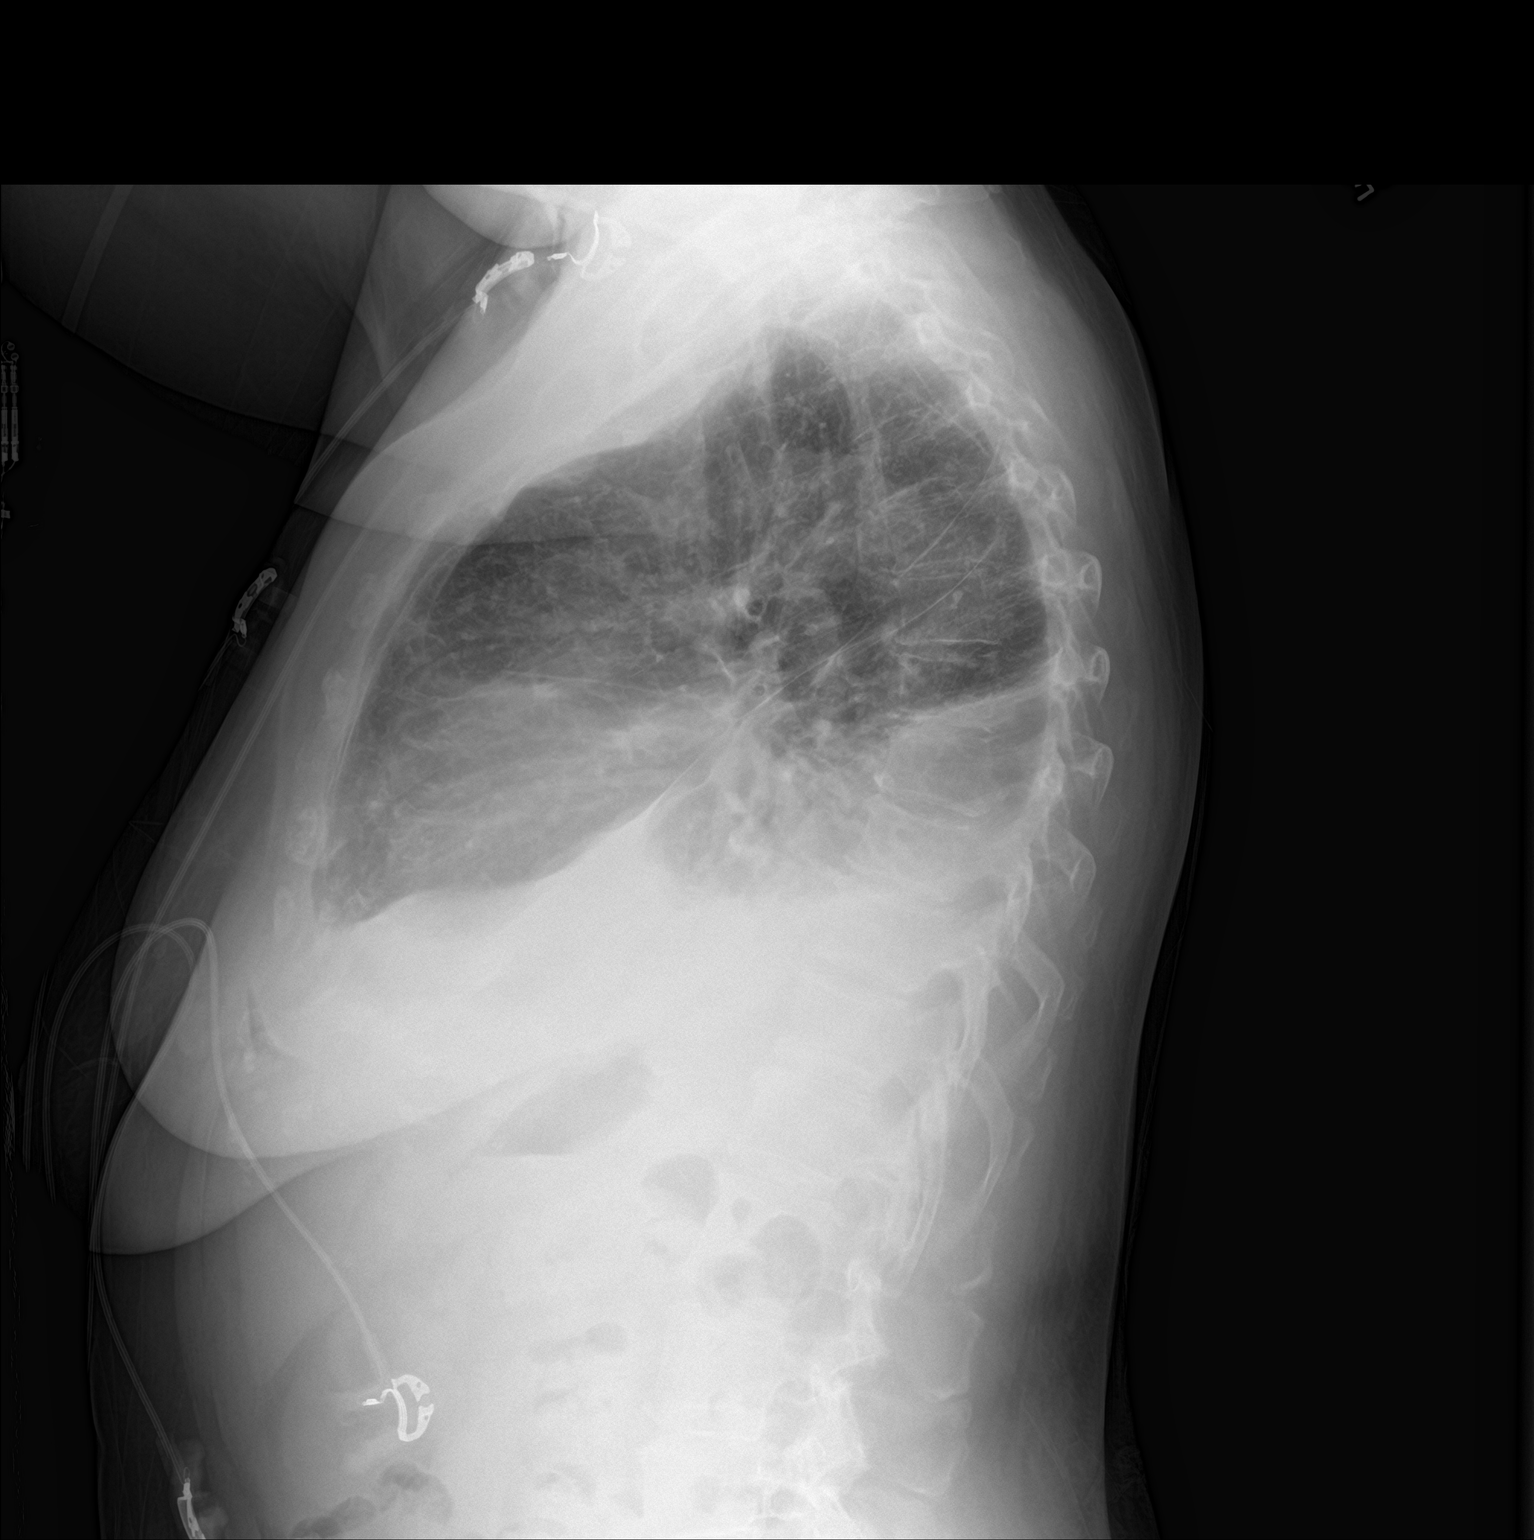

[2 of 2 positions shown; findings below may reference images not displayed]

FINDINGS: The patient has known moderate bilateral pleural effusions greater
on the left than on the right. The volume of these effusions is
stable to slightly increased. The cardiac silhouette remains
enlarged. The pulmonary vascularity is not engorged. There is no
pneumothorax. The mediastinum is normal in width.
IMPRESSION: Moderate sized bilateral pleural effusions not greatly changed since
yesterday's study.

## 2016-12-05 IMAGING — DX DG CHEST 1V
1 series · 1 of 1 positions shown · non-contrast
Comparison: Study obtained earlier in the day.

CLINICAL DATA: Status post left-sided thoracentesis

EXAM:
CHEST 1 VIEW

[w chest pa]
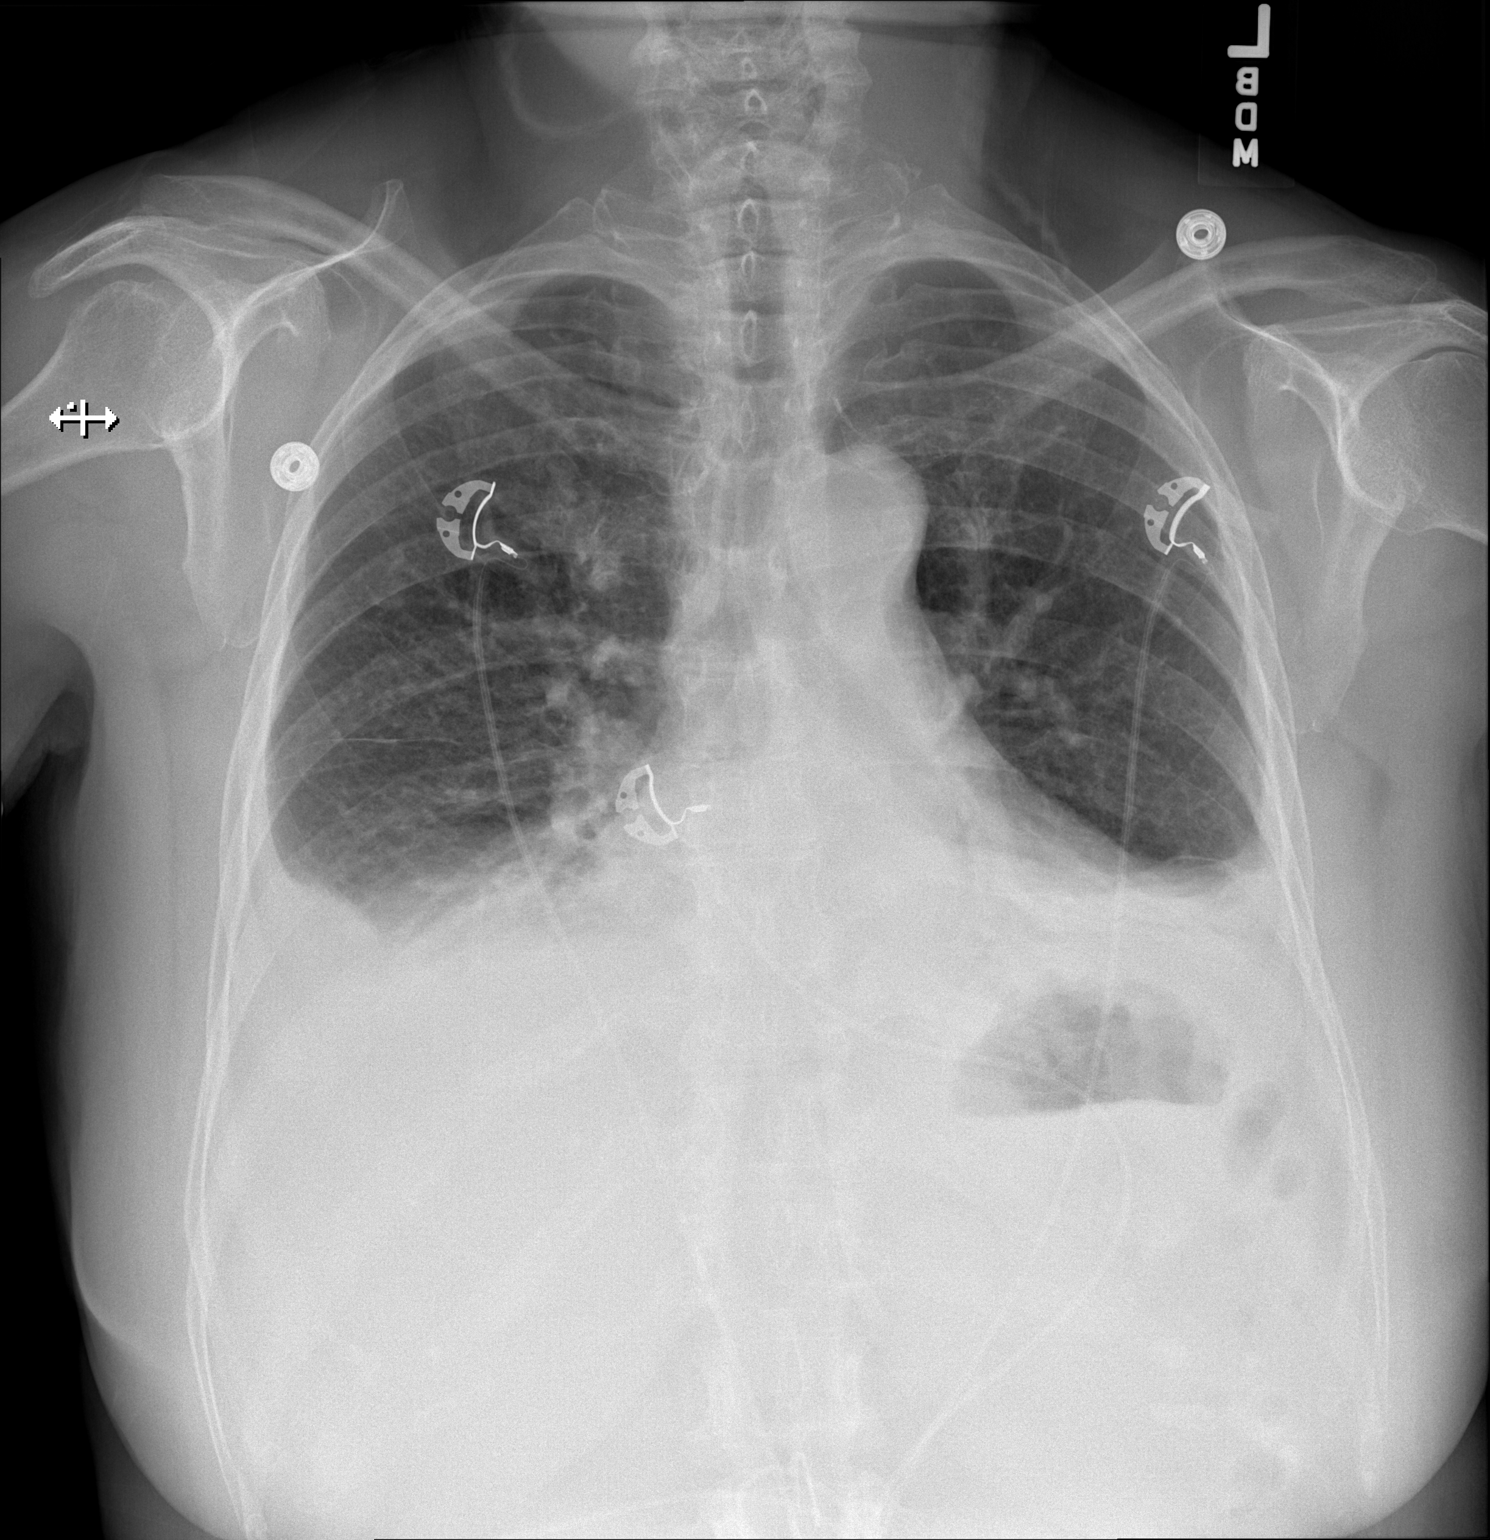

[1 of 1 positions shown; findings below may reference images not displayed]

FINDINGS: Left effusion is smaller following thoracentesis. No pneumothorax.
Pleural effusions remain bilaterally, now larger on the right than
on the left. There is patchy atelectasis in each lung base. Heart
size and pulmonary vascular normal. No adenopathy. No bone lesions.
IMPRESSION: Left-sided pleural effusion is much smaller following thoracentesis.
Small pleural effusions, larger on the right than on the left, are
now present. There is atelectatic change in both lung bases, more on
the left on the right. No change in cardiac silhouette. No
pneumothorax.

## 2017-01-13 DIAGNOSIS — E782 Mixed hyperlipidemia: Secondary | ICD-10-CM | POA: Diagnosis not present

## 2017-01-13 DIAGNOSIS — Z0000 Encounter for general adult medical examination without abnormal findings: Secondary | ICD-10-CM | POA: Diagnosis not present

## 2017-01-13 DIAGNOSIS — R5383 Other fatigue: Secondary | ICD-10-CM | POA: Diagnosis not present

## 2017-01-14 DIAGNOSIS — R5383 Other fatigue: Secondary | ICD-10-CM | POA: Diagnosis not present

## 2017-01-14 DIAGNOSIS — E782 Mixed hyperlipidemia: Secondary | ICD-10-CM | POA: Diagnosis not present

## 2017-01-23 ENCOUNTER — Ambulatory Visit: Payer: Medicare Other | Admitting: Physician Assistant

## 2017-01-23 DIAGNOSIS — M329 Systemic lupus erythematosus, unspecified: Secondary | ICD-10-CM | POA: Diagnosis not present

## 2017-01-23 DIAGNOSIS — M150 Primary generalized (osteo)arthritis: Secondary | ICD-10-CM | POA: Diagnosis not present

## 2017-01-23 DIAGNOSIS — R5383 Other fatigue: Secondary | ICD-10-CM | POA: Diagnosis not present

## 2017-01-23 DIAGNOSIS — E782 Mixed hyperlipidemia: Secondary | ICD-10-CM | POA: Diagnosis not present

## 2017-01-24 ENCOUNTER — Encounter: Payer: Self-pay | Admitting: Physician Assistant

## 2017-01-24 ENCOUNTER — Other Ambulatory Visit: Payer: Self-pay | Admitting: Internal Medicine

## 2017-01-24 ENCOUNTER — Ambulatory Visit (INDEPENDENT_AMBULATORY_CARE_PROVIDER_SITE_OTHER): Payer: Medicare Other | Admitting: Physician Assistant

## 2017-01-24 VITALS — BP 110/80 | HR 84 | Ht 59.0 in | Wt 156.4 lb

## 2017-01-24 DIAGNOSIS — Z1231 Encounter for screening mammogram for malignant neoplasm of breast: Secondary | ICD-10-CM

## 2017-01-24 DIAGNOSIS — Z800 Family history of malignant neoplasm of digestive organs: Secondary | ICD-10-CM

## 2017-01-24 DIAGNOSIS — R109 Unspecified abdominal pain: Secondary | ICD-10-CM

## 2017-01-24 DIAGNOSIS — R194 Change in bowel habit: Secondary | ICD-10-CM

## 2017-01-24 DIAGNOSIS — R195 Other fecal abnormalities: Secondary | ICD-10-CM

## 2017-01-24 MED ORDER — NA SULFATE-K SULFATE-MG SULF 17.5-3.13-1.6 GM/177ML PO SOLN: ORAL | 0 refills | Status: DC

## 2017-01-24 NOTE — Patient Instructions (Addendum)
If you are age 69 or older, your body mass index should be between 23-30. Your Body mass index is 31.58 kg/m. If this is out of the aforementioned range listed, please consider follow up with your Primary Care Provider.  If you are age 26 or younger, your body mass index should be between 19-25. Your Body mass index is 31.58 kg/m. If this is out of the aformentioned range listed, please consider follow up with your Primary Care Provider.   You have been scheduled for a colonoscopy. Please follow written instructions given to you at your visit today.  Please pick up your prep supplies at the pharmacy within the next 1-3 days. If you use inhalers (even only as needed), please bring them with you on the day of your procedure. Your physician has requested that you go to www.startemmi.com and enter the access code given to you at your visit today. This web site gives a general overview about your procedure. However, you should still follow specific instructions given to you by our office regarding your preparation for the procedure.  You may have a light breakfast the morning of prep day (the day before the procedure).   You may choose from one of the following items: eggs and toast OR chicken noodle soup and crackers.   You should have your breakfast completed between 8:00 and 9:00 am the day before your procedure.    After you have had your light breakfast you should start a clear liquid diet only, NO SOLIDS. No additional solid food is allowed. You may continue to have clear liquid up to 3 hours prior to your procedure.   Thank you for choosing me and [facility].   Ellouise Newer, PA-C

## 2017-01-24 NOTE — Progress Notes (Signed)
Chief Complaint: Family history of colon cancer  HPI:  Jane Chavez is a 71 year old AA female with a past medical history of lupus and pleural effusions, as well as others listed below, who was referred to me by [name], MD for consultation of a screening colonoscopy for family history of colon cancer.   Recent labs completed 01/14/2017 showing normal CBC and CMP.     Today, the patient tells me that she previously lived in [place] and had all of her screening colonoscopies done there. She believes her last one was at least 10 years ago but we do not have records. Recently the patient tells me that she learned of a stronger family history as her sister was diagnosed at the age of 52 with stage III colon cancer and her niece just passed away in her early 71s from colon cancer and her brother just had a colon mass removed in his 85s.   Patient also describes that she has had a decrease in the caliber of her stool over the past few months. She denies any known history of hemorrhoids and denies diarrhea or constipation.   The patient also takes time to describe a right-sided abdominal pain which she feels somewhat superficially. She tells me it "feels like some thing is in their, like a pressure", apparently she had an ultrasound done by her primary care provider which was normal within the past year for evaluation of this and was told this was likely just a muscle strain. The patient is not sure about that.   Patient denies fever, chills, blood in her stool, melena, weight loss, fatigue, nausea, vomiting, heartburn, reflux or symptoms that awaken her at night.  Past Medical History:  Diagnosis Date  . GERD (gastroesophageal reflux disease)   . HLD (hyperlipidemia)   . Lupus   . Pleural effusion 09/2015    Past Surgical History:  Procedure Laterality Date  . BREAST LUMPECTOMY    . REPLACEMENT TOTAL KNEE     Right    Current Outpatient Prescriptions  Medication Sig Dispense Refill   . Calcium Carb-Cholecalciferol (CALCIUM 500 +D) 500-400 MG-UNIT TABS Take by mouth.    . furosemide (LASIX) 80 MG tablet Take 40 mg by mouth daily.  0  . hydroxychloroquine (PLAQUENIL) 200 MG tablet Take 1 tablet (200 mg total) by mouth daily. (Patient taking differently: Take 400 mg by mouth daily. ) 30 tablet 0  . lisinopril (PRINIVIL,ZESTRIL) 20 MG tablet Take 20 mg by mouth daily.    . potassium chloride SA (K-DUR,KLOR-CON) 20 MEQ tablet Take 20 mEq by mouth daily.  0  . predniSONE (DELTASONE) 10 MG tablet Take 10 mg by mouth daily with breakfast.     No current facility-administered medications for this visit.     Allergies as of 01/24/2017 - Review Complete 11/05/2016  Allergen Reaction Noted  . Penicillins Shortness Of Breath 12/03/2012    No family history on file.  Social History   Social History  . Marital status: Single    Spouse name: N/A  . Number of children: N/A  . Years of education: N/A   Occupational History  . Not on file.   Social History Main Topics  . Smoking status: Never Smoker  . Smokeless tobacco: Never Used  . Alcohol use No  . Drug use: No  . Sexual activity: Not on file   Other Topics Concern  . Not on file   Social History Narrative  . No narrative on file  Review of Systems:    Constitutional: No weight loss, fever or chills Skin: No rash  Cardiovascular: No chest pain Respiratory: No SOB  Gastrointestinal: See HPI and otherwise negative Genitourinary: No dysuria  Neurological: No headache Musculoskeletal: No new muscle or joint pain Hematologic: No bleeding or bruising Psychiatric: No history of depression or anxiety   Physical Exam:  Vital signs: BP 110/80 (BP Location: Left Arm, Patient Position: Sitting, Cuff Size: Normal)   Pulse 84   Ht 4\' 11"  (1.499 m) Comment: height measured without shoes  Wt 156 lb 6 oz (70.9 kg)   BMI 31.58 kg/m    Constitutional:   Pleasant AA female appears to be in NAD, Well developed,  Well nourished, alert and cooperative Head:  Normocephalic and atraumatic. Eyes:   PEERL, EOMI. No icterus. Conjunctiva pink. Ears:  Normal auditory acuity. Neck:  Supple Throat: Oral cavity and pharynx without inflammation, swelling or lesion.  Respiratory: Respirations even and unlabored. Lungs clear to auscultation bilaterally.   No wheezes, crackles, or rhonchi.  Cardiovascular: Normal S1, S2. No MRG. Regular rate and rhythm. No peripheral edema, cyanosis or pallor.  Gastrointestinal:  Soft, nondistended, nontender. No rebound or guarding. Normal bowel sounds. No appreciable masses or hepatomegaly. Rectal:  Not performed.  Msk:  Symmetrical without gross deformities. Without edema, no deformity or joint abnormality.  Neurologic:  Alert and  oriented x4;  grossly normal neurologically.  Skin:   Dry and intact without significant lesions or rashes. Psychiatric:  Demonstrates good judgement and reason without abnormal affect or behaviors.  See HPI for recent labs.  Assessment: 1. Family history of colon cancer: In her sister in her 48s, brother in his 73s and niece in her 25s, patient's last colonoscopy 10 years ago, recommend repeat 2. Change in stool appearance: Decreasing caliber over the past few months; question relation to hemorrhoids 3. Abdominal pain: Describes a superficial right-sided abdominal pain, not tender at time of palpation today, "feels like something is in there", previous ultrasound apparently normal per patient; question lipoma versus mass versus nerve damage versus other  Plan: 1. Scheduled patient for colonoscopy with Dr. Havery Moros in the [facility]. Discussed risks, benefits, limitations and alternatives and the patient agrees to proceed. 2. Discussed with patient that if colonoscopy is normal and she would like further evaluation of this abdominal sensation we could order CT of the abdomen without contrast for further eval 3. Requested records from [place] and recent  abdominal US 4. Patient to return to clinic per recommendations from Dr. Havery Moros after time of procedure.  Ellouise Newer, PA-C [facility] 01/24/2017, 1:23 PM  Cc: [name], MD

## 2017-01-24 NOTE — Progress Notes (Signed)
Agree with assessment and plan as outlined.  

## 2017-01-28 ENCOUNTER — Ambulatory Visit
Admission: RE | Admit: 2017-01-28 | Discharge: 2017-01-28 | Disposition: A | Discharge status: Home / Self Care | Payer: Medicare Other | Source: Ambulatory Visit | Attending: Internal Medicine | Admitting: Internal Medicine

## 2017-01-28 DIAGNOSIS — Z1231 Encounter for screening mammogram for malignant neoplasm of breast: Secondary | ICD-10-CM | POA: Diagnosis not present

## 2017-02-06 DIAGNOSIS — M1990 Unspecified osteoarthritis, unspecified site: Secondary | ICD-10-CM | POA: Diagnosis not present

## 2017-02-06 DIAGNOSIS — M3500 Sicca syndrome, unspecified: Secondary | ICD-10-CM | POA: Diagnosis not present

## 2017-02-06 DIAGNOSIS — M329 Systemic lupus erythematosus, unspecified: Secondary | ICD-10-CM | POA: Diagnosis not present

## 2017-02-06 DIAGNOSIS — Z79899 Other long term (current) drug therapy: Secondary | ICD-10-CM | POA: Diagnosis not present

## 2017-03-10 DIAGNOSIS — M1990 Unspecified osteoarthritis, unspecified site: Secondary | ICD-10-CM | POA: Diagnosis not present

## 2017-03-10 DIAGNOSIS — E559 Vitamin D deficiency, unspecified: Secondary | ICD-10-CM | POA: Diagnosis not present

## 2017-03-10 DIAGNOSIS — M329 Systemic lupus erythematosus, unspecified: Secondary | ICD-10-CM | POA: Diagnosis not present

## 2017-03-10 DIAGNOSIS — M3500 Sicca syndrome, unspecified: Secondary | ICD-10-CM | POA: Diagnosis not present

## 2017-03-10 DIAGNOSIS — M25569 Pain in unspecified knee: Secondary | ICD-10-CM | POA: Diagnosis not present

## 2017-03-10 DIAGNOSIS — M2550 Pain in unspecified joint: Secondary | ICD-10-CM | POA: Diagnosis not present

## 2017-03-10 DIAGNOSIS — M8580 Other specified disorders of bone density and structure, unspecified site: Secondary | ICD-10-CM | POA: Diagnosis not present

## 2017-03-10 DIAGNOSIS — Z79899 Other long term (current) drug therapy: Secondary | ICD-10-CM | POA: Diagnosis not present

## 2017-03-13 DIAGNOSIS — M2569 Stiffness of other specified joint, not elsewhere classified: Secondary | ICD-10-CM

## 2017-03-13 HISTORY — DX: Stiffness of other specified joint, not elsewhere classified: M25.69

## 2017-03-14 ENCOUNTER — Encounter: Payer: Medicare Other | Admitting: Gastroenterology

## 2017-03-20 DIAGNOSIS — M47816 Spondylosis without myelopathy or radiculopathy, lumbar region: Secondary | ICD-10-CM | POA: Diagnosis not present

## 2017-03-20 DIAGNOSIS — E559 Vitamin D deficiency, unspecified: Secondary | ICD-10-CM | POA: Diagnosis not present

## 2017-03-20 DIAGNOSIS — M8580 Other specified disorders of bone density and structure, unspecified site: Secondary | ICD-10-CM | POA: Diagnosis not present

## 2017-03-20 DIAGNOSIS — Z79899 Other long term (current) drug therapy: Secondary | ICD-10-CM | POA: Diagnosis not present

## 2017-03-20 DIAGNOSIS — M546 Pain in thoracic spine: Secondary | ICD-10-CM | POA: Diagnosis not present

## 2017-03-20 DIAGNOSIS — M329 Systemic lupus erythematosus, unspecified: Secondary | ICD-10-CM | POA: Diagnosis not present

## 2017-03-20 DIAGNOSIS — Z23 Encounter for immunization: Secondary | ICD-10-CM | POA: Diagnosis not present

## 2017-03-20 DIAGNOSIS — M1990 Unspecified osteoarthritis, unspecified site: Secondary | ICD-10-CM | POA: Diagnosis not present

## 2017-04-01 ENCOUNTER — Telehealth: Payer: Self-pay | Admitting: Gastroenterology

## 2017-04-01 NOTE — Telephone Encounter (Signed)
Patient for last 2 weeks has been having mid-upper back pain related to Lupus, she is scheduled for colonoscopy on 10/25. She states that she can lay on her left side but then will have muscle spasms and cannot move. She sees her rheumatologist, Dr. Michela Pitcher, who is referring her to spin specialist.

## 2017-04-01 NOTE — Telephone Encounter (Signed)
Spoke to patient, she will proceed with colonoscopy as planned.

## 2017-04-01 NOTE — Telephone Encounter (Signed)
She has a strong family history of colon cancer, I think she should proceed if at all possible. If lying on left side is uncomfortable, we can keep her on her back or on right side, which ever is best, I can discuss with her when she is hear. If she is okay to proceed with this please have her keep her appointment. thanks

## 2017-04-03 ENCOUNTER — Encounter: Payer: Self-pay | Admitting: Gastroenterology

## 2017-04-03 ENCOUNTER — Ambulatory Visit (AMBULATORY_SURGERY_CENTER): Payer: Medicare Other | Admitting: Gastroenterology

## 2017-04-03 VITALS — BP 119/72 | HR 77 | Temp 98.7°F | Resp 15 | Ht 59.0 in | Wt 156.0 lb

## 2017-04-03 DIAGNOSIS — Z800 Family history of malignant neoplasm of digestive organs: Secondary | ICD-10-CM

## 2017-04-03 DIAGNOSIS — Z1211 Encounter for screening for malignant neoplasm of colon: Secondary | ICD-10-CM

## 2017-04-03 DIAGNOSIS — D122 Benign neoplasm of ascending colon: Secondary | ICD-10-CM

## 2017-04-03 DIAGNOSIS — Z1212 Encounter for screening for malignant neoplasm of rectum: Secondary | ICD-10-CM | POA: Diagnosis not present

## 2017-04-03 DIAGNOSIS — D120 Benign neoplasm of cecum: Secondary | ICD-10-CM | POA: Diagnosis not present

## 2017-04-03 MED ORDER — SODIUM CHLORIDE 0.9 % IV SOLN: 500.0000 mL | INTRAVENOUS | Status: DC

## 2017-04-03 NOTE — Op Note (Signed)
[facility] Patient Name: Jane Chavez Procedure Date: 04/03/2017 10:25 AM MRN: [id number] Endoscopist: Remo Lipps P. Britton Bera MD, MD Age: 71 Referring MD:  Date of Birth: 06-27-1945 Gender: Female Account #: [id number] Procedure:                Colonoscopy Indications:              Screening in patient at increased risk: Family                            history of 1st-degree relative with colorectal                            cancer before age 44 years (brother and sister with                            colon cancer) Medicines:                Monitored Anesthesia Care Procedure:                Pre-Anesthesia Assessment:                           - Prior to the procedure, a History and Physical                            was performed, and patient medications and                            allergies were reviewed. The patient's tolerance of                            previous anesthesia was also reviewed. The risks                            and benefits of the procedure and the sedation                            options and risks were discussed with the patient.                            All questions were answered, and informed consent                            was obtained. Prior Anticoagulants: The patient has                            taken no previous anticoagulant or antiplatelet                            agents. ASA Grade Assessment: II - A patient with                            mild systemic disease. After reviewing the risks  and benefits, the patient was deemed in                            satisfactory condition to undergo the procedure.                           After obtaining informed consent, the colonoscope                            was passed under direct vision. Throughout the                            procedure, the patient's blood pressure, pulse, and                            oxygen saturations were monitored  continuously. The                            Colonoscope was introduced through the anus and                            advanced to the the cecum, identified by                            appendiceal orifice and ileocecal valve. The                            colonoscopy was performed without difficulty. The                            patient tolerated the procedure well. The quality                            of the bowel preparation was good. The ileocecal                            valve, appendiceal orifice, and rectum were                            photographed. Scope In: 10:29:02 AM Scope Out: 10:44:13 AM Scope Withdrawal Time: 0 hours 13 minutes 1 second  Total Procedure Duration: 0 hours 15 minutes 11 seconds  Findings:                 The perianal and digital rectal examinations were                            normal.                           A 3 mm polyp was found in the cecum. The polyp was                            sessile. The polyp was removed with a cold snare.  Resection and retrieval were complete.                           A 3 mm polyp was found in the ascending colon in                            the retroflexed position. The polyp was sessile.                            The polyp was removed with a cold biopsy forceps.                            Resection and retrieval were complete.                           Scattered medium-mouthed diverticula were found in                            the sigmoid colon and transverse colon.                           The exam was otherwise without abnormality.                            Retroflexion not performed in the rectum due to                            small rectal vault. Complications:            No immediate complications. Estimated blood loss:                            Minimal. Estimated Blood Loss:     Estimated blood loss was minimal. Impression:               - One 3 mm polyp in the cecum,  removed with a cold                            snare. Resected and retrieved.                           - One 3 mm polyp in the ascending colon, removed                            with a cold biopsy forceps. Resected and retrieved.                           - Diverticulosis in the sigmoid colon and in the                            transverse colon.                           - The examination was otherwise normal. Recommendation:           -  Patient has a contact number available for                            emergencies. The signs and symptoms of potential                            delayed complications were discussed with the                            patient. Return to normal activities tomorrow.                            Written discharge instructions were provided to the                            patient.                           - Resume previous diet.                           - Continue present medications.                           - Await pathology results.                           - Repeat colonoscopy in 5 years for screening                            purposes. Remo Lipps P. Jobina Maita MD, MD 04/03/2017 10:48:44 AM This report has been signed electronically.

## 2017-04-03 NOTE — Progress Notes (Signed)
Called to room to assist during endoscopic procedure.  Patient ID and intended procedure confirmed with present staff. Received instructions for my participation in the procedure from the performing physician.  

## 2017-04-03 NOTE — Patient Instructions (Signed)
YOU HAD AN ENDOSCOPIC PROCEDURE TODAY AT THE [facility]:   Refer to the procedure report that was given to you for any specific questions about what was found during the examination.  If the procedure report does not answer your questions, please call your gastroenterologist to clarify.  If you requested that your care partner not be given the details of your procedure findings, then the procedure report has been included in a sealed envelope for you to review at your convenience later.  YOU SHOULD EXPECT: Some feelings of bloating in the abdomen. Passage of more gas than usual.  Walking can help get rid of the air that was put into your GI tract during the procedure and reduce the bloating. If you had a lower endoscopy (such as a colonoscopy or flexible sigmoidoscopy) you may notice spotting of blood in your stool or on the toilet paper. If you underwent a bowel prep for your procedure, you may not have a normal bowel movement for a few days.  Please Note:  You might notice some irritation and congestion in your nose or some drainage.  This is from the oxygen used during your procedure.  There is no need for concern and it should clear up in a day or so.  SYMPTOMS TO REPORT IMMEDIATELY:   Following lower endoscopy (colonoscopy or flexible sigmoidoscopy):  Excessive amounts of blood in the stool  Significant tenderness or worsening of abdominal pains  Swelling of the abdomen that is new, acute  Fever of 100F or higher  For urgent or emergent issues, a gastroenterologist can be reached at any hour by calling [phone].   DIET:  We do recommend a small meal at first, but then you may proceed to your regular diet.  Drink plenty of fluids but you should avoid alcoholic beverages for 24 hours.  ACTIVITY:  You should plan to take it easy for the rest of today and you should NOT DRIVE or use heavy machinery until tomorrow (because of the sedation medicines used during the test).     FOLLOW UP: Our staff will call the number listed on your records the next business day following your procedure to check on you and address any questions or concerns that you may have regarding the information given to you following your procedure. If we do not reach you, we will leave a message.  However, if you are feeling well and you are not experiencing any problems, there is no need to return our call.  We will assume that you have returned to your regular daily activities without incident.  If any biopsies were taken you will be contacted by phone or by letter within the next 1-3 weeks.  Please call us at [phone] if you have not heard about the biopsies in 3 weeks.   Await for biopsy results to determine next repeat Colonoscopy screening Polyps (handout given) Diverticulosis (handout given)   SIGNATURES/CONFIDENTIALITY: You and/or your care partner have signed paperwork which will be entered into your electronic medical record.  These signatures attest to the fact that that the information above on your After Visit Summary has been reviewed and is understood.  Full responsibility of the confidentiality of this discharge information lies with you and/or your care-partner. 

## 2017-04-03 NOTE — Progress Notes (Signed)
Report to PACU, RN, vss, BBS= Clear.  

## 2017-04-04 ENCOUNTER — Telehealth: Payer: Self-pay | Admitting: *Deleted

## 2017-04-04 NOTE — Telephone Encounter (Signed)
  Follow up Call-  Call back number 04/03/2017  Post procedure Call Back phone  # [phone]  Permission to leave phone message Yes  Some recent data might be hidden     Patient questions:  Do you have a fever, pain , or abdominal swelling? No. Pain Score  0 *  Have you tolerated food without any problems? Yes.    Have you been able to return to your normal activities? Yes.    Do you have any questions about your discharge instructions: Diet   No. Medications  No. Follow up visit  No.  Do you have questions or concerns about your Care? No.  Actions: * If pain score is 4 or above: No action needed, pain <4.

## 2017-04-09 ENCOUNTER — Encounter: Payer: Self-pay | Admitting: Gastroenterology

## 2017-04-29 DIAGNOSIS — M1990 Unspecified osteoarthritis, unspecified site: Secondary | ICD-10-CM | POA: Diagnosis not present

## 2017-04-29 DIAGNOSIS — M546 Pain in thoracic spine: Secondary | ICD-10-CM | POA: Diagnosis not present

## 2017-04-29 DIAGNOSIS — M8580 Other specified disorders of bone density and structure, unspecified site: Secondary | ICD-10-CM | POA: Diagnosis not present

## 2017-04-29 DIAGNOSIS — R51 Headache: Secondary | ICD-10-CM | POA: Diagnosis not present

## 2017-04-29 DIAGNOSIS — Z79899 Other long term (current) drug therapy: Secondary | ICD-10-CM | POA: Diagnosis not present

## 2017-04-29 DIAGNOSIS — E559 Vitamin D deficiency, unspecified: Secondary | ICD-10-CM | POA: Diagnosis not present

## 2017-04-29 DIAGNOSIS — M329 Systemic lupus erythematosus, unspecified: Secondary | ICD-10-CM | POA: Diagnosis not present

## 2017-05-07 IMAGING — US US CHEST/MEDIASTINUM
1 series · 3 of 3 positions shown · non-contrast
Comparison: No priors.

CLINICAL DATA: 69-year-old female with history of small right
pleural effusion in the right chest. Evaluate for potential
thoracentesis.

EXAM:
CHEST ULTRASOUND

[Series 1: us chest/mediastinum · 0.23mm/px · 3 of 3 slices shown]
[im 1/3]
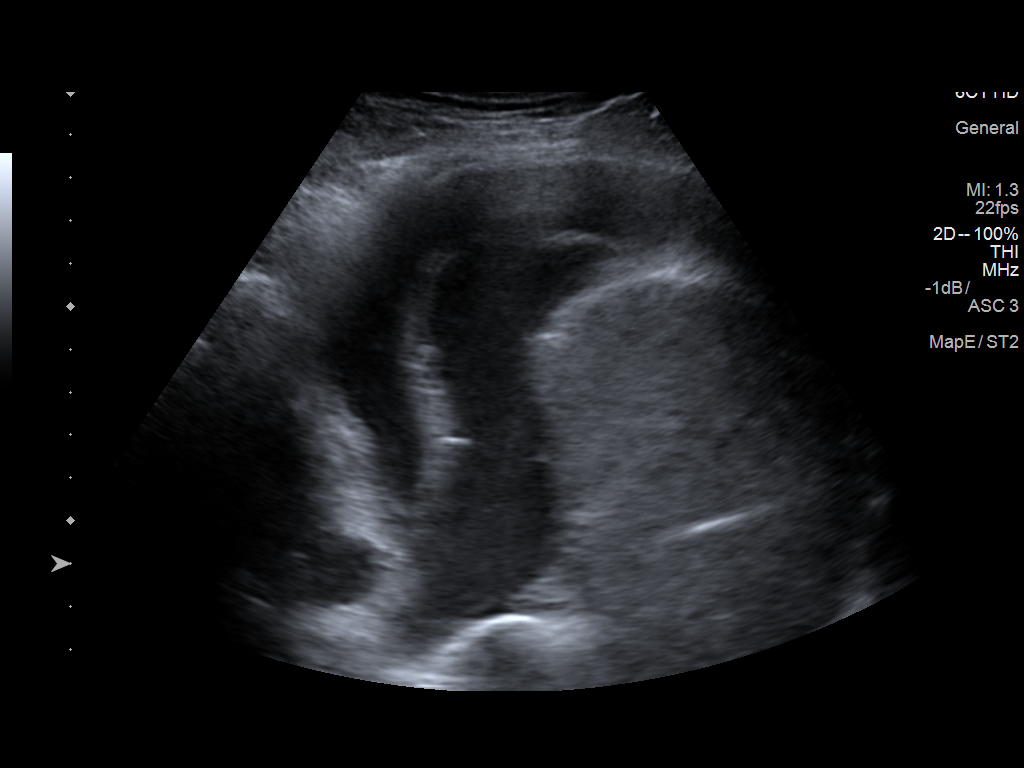
[im 2/3]
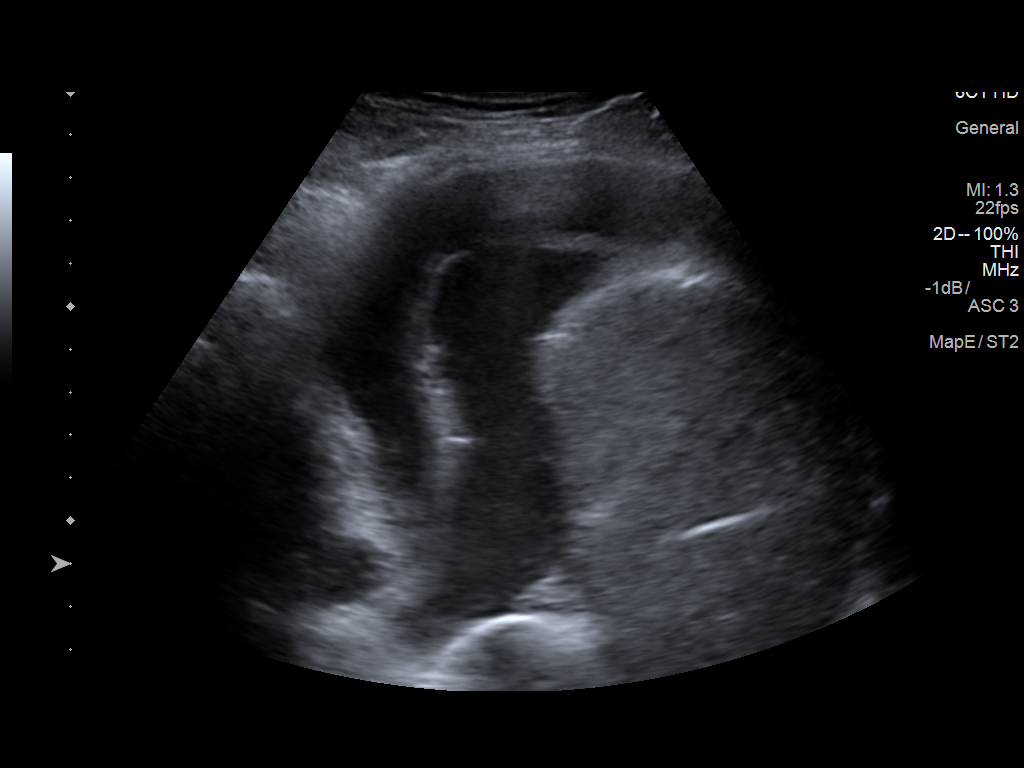
[im 3/3]
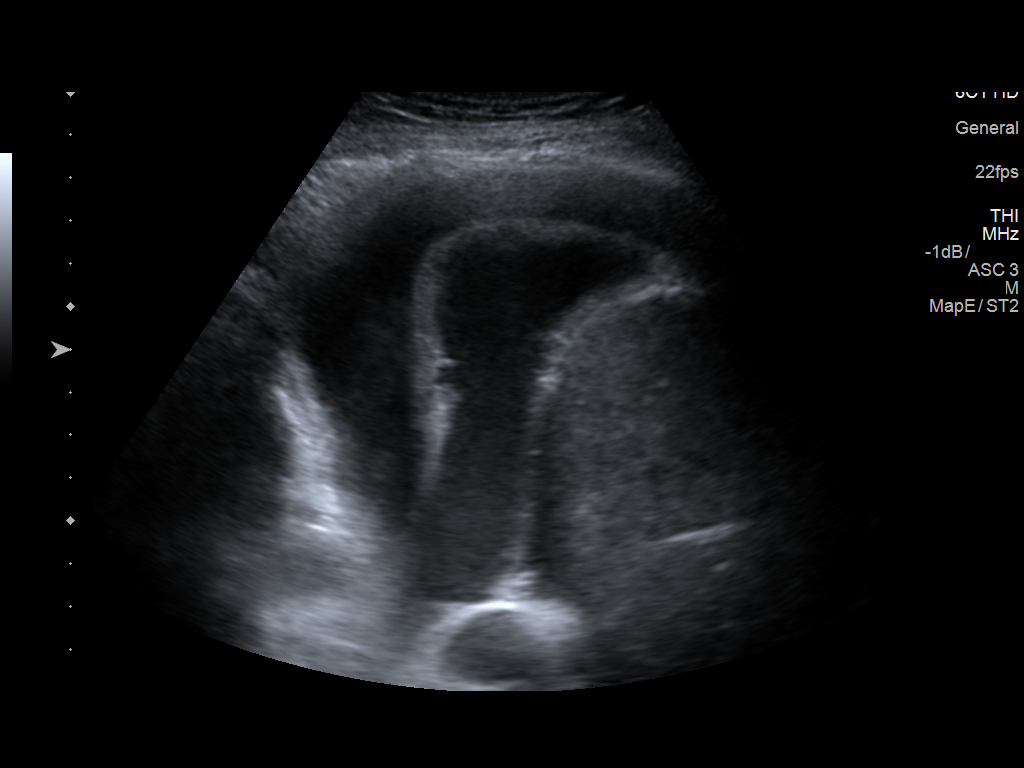

[3 of 3 positions shown; findings below may reference images not displayed]

FINDINGS: Small right pleural effusion noted, of insufficient volume to safely
perform thoracentesis.
IMPRESSION: 1. Small right pleural effusion.

## 2017-05-08 ENCOUNTER — Ambulatory Visit: Payer: Medicare Other | Admitting: Physician Assistant

## 2017-05-17 IMAGING — US US THORACENTESIS ASP PLEURAL SPACE W/IMG GUIDE
1 series · 3 of 3 positions shown · non-contrast
Comparison: none

INDICATION: Shortness of breath. Lupus. Bilateral pleural effusions. Request
diagnostic and therapeutic thoracentesis.

[Series 1: us thoracentesis asp pleural space w/img guide · 0.26mm/px · 3 of 3 slices shown]
[im 1/3]
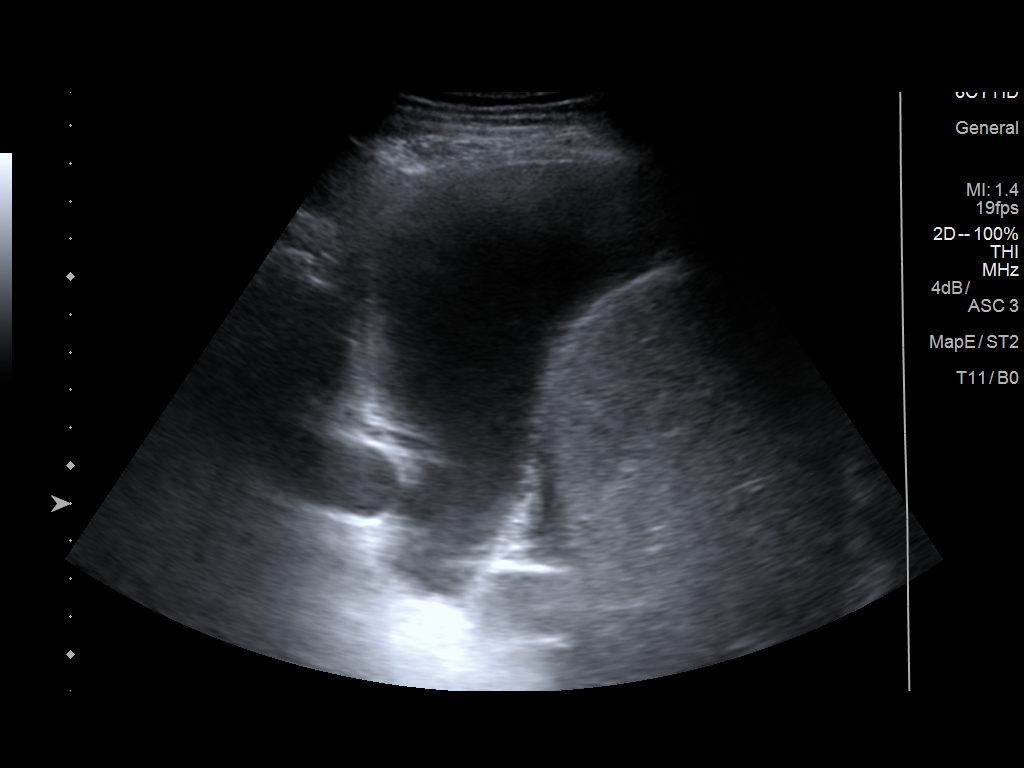
[im 2/3]
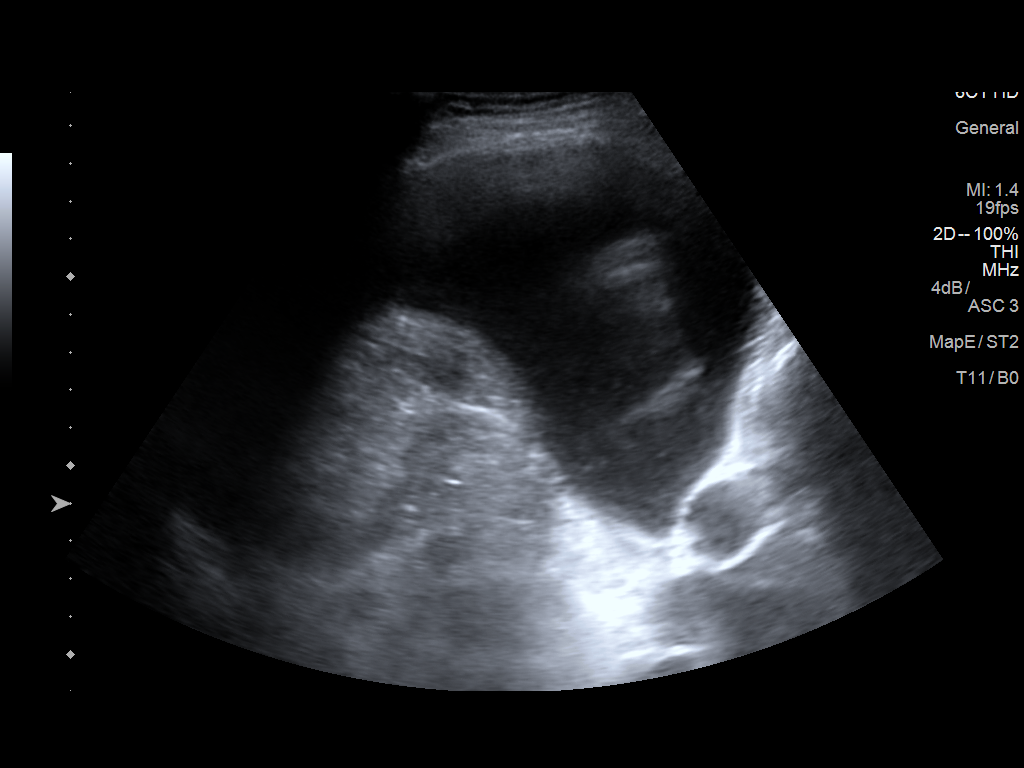
[im 3/3]
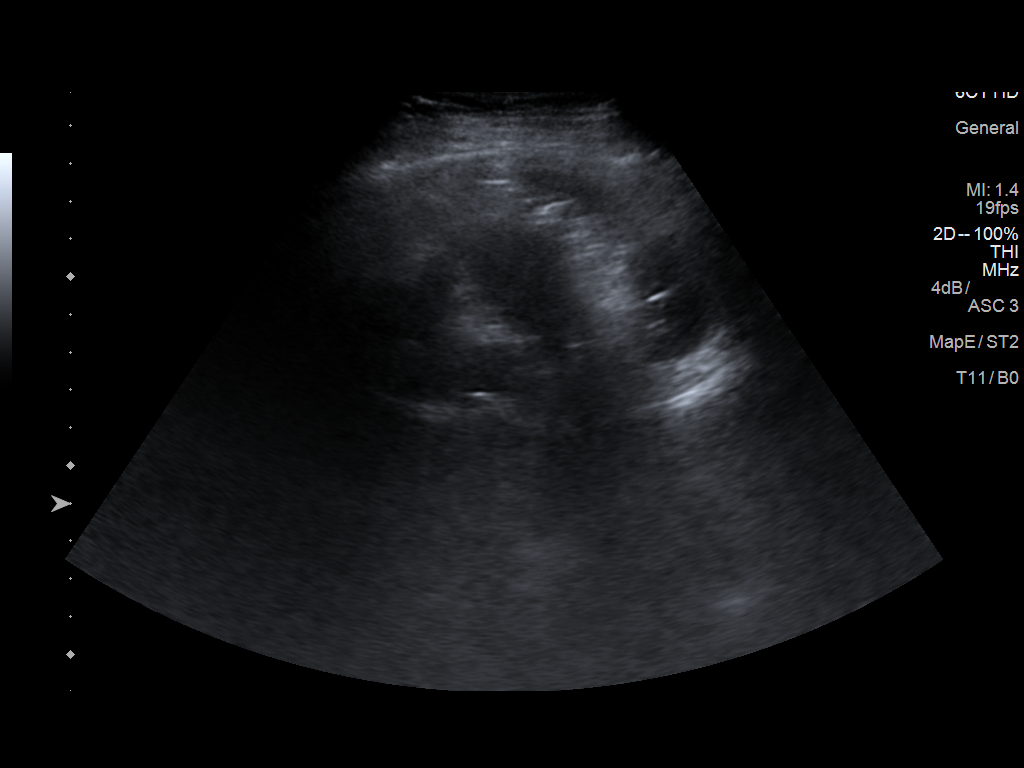

[3 of 3 positions shown; findings below may reference images not displayed]

EXAM:
ULTRASOUND GUIDED LEFT THORACENTESIS

MEDICATIONS:
None.

COMPLICATIONS:
None immediate.

PROCEDURE:
An ultrasound guided thoracentesis was thoroughly discussed with the
patient and questions answered. The benefits, risks, alternatives
and complications were also discussed. The patient understands and
wishes to proceed with the procedure. Written consent was obtained.

Ultrasound was performed to localize and mark an adequate pocket of
fluid in the left chest. The area was then prepped and draped in the
normal sterile fashion. 1% Lidocaine was used for local anesthesia.
Under ultrasound guidance a Safe-T-Centesis catheter was introduced.
Thoracentesis was performed. The catheter was removed and a dressing
applied.
FINDINGS: A total of approximately 1.45 L of clear yellow fluid was removed.
Samples were sent to the laboratory as requested by the clinical
team.
IMPRESSION: Successful ultrasound guided left thoracentesis yielding 1.45 L of
pleural fluid.

## 2017-05-30 ENCOUNTER — Other Ambulatory Visit (INDEPENDENT_AMBULATORY_CARE_PROVIDER_SITE_OTHER): Payer: Medicare Other

## 2017-05-30 ENCOUNTER — Encounter: Payer: Self-pay | Admitting: Gastroenterology

## 2017-05-30 ENCOUNTER — Ambulatory Visit (INDEPENDENT_AMBULATORY_CARE_PROVIDER_SITE_OTHER): Payer: Medicare Other | Admitting: Gastroenterology

## 2017-05-30 VITALS — BP 118/60 | HR 70 | Ht 59.0 in | Wt 161.0 lb

## 2017-05-30 DIAGNOSIS — Z86010 Personal history of colonic polyps: Secondary | ICD-10-CM | POA: Diagnosis not present

## 2017-05-30 DIAGNOSIS — R1010 Upper abdominal pain, unspecified: Secondary | ICD-10-CM

## 2017-05-30 DIAGNOSIS — R195 Other fecal abnormalities: Secondary | ICD-10-CM | POA: Diagnosis not present

## 2017-05-30 LAB — BASIC METABOLIC PANEL
BUN: 12 mg/dL (ref 6–23)
CO2: 29 mEq/L (ref 19–32)
Calcium: 9 mg/dL (ref 8.4–10.5)
Chloride: 105 mEq/L (ref 96–112)
Creatinine, Ser: 0.86 mg/dL (ref 0.40–1.20)
GFR: 84.04 mL/min (ref >60.00)
Glucose, Bld: 79 mg/dL (ref 70–99)
Potassium: 3.9 mEq/L (ref 3.5–5.1)
Sodium: 141 mEq/L (ref 135–145)

## 2017-05-30 MED ORDER — OMEPRAZOLE 40 MG PO CPDR: 40.0000 mg | DELAYED_RELEASE_CAPSULE | Freq: Every day | ORAL | 1 refills | Status: DC

## 2017-05-30 NOTE — Patient Instructions (Signed)
If you are age 69 or older, your body mass index should be between 23-30. Your Body mass index is 32.52 kg/m. If this is out of the aforementioned range listed, please consider follow up with your Primary Care Provider.  If you are age 8 or younger, your body mass index should be between 19-25. Your Body mass index is 32.52 kg/m. If this is out of the aformentioned range listed, please consider follow up with your Primary Care Provider.    You have been scheduled for a CT scan of the abdomen and pelvis at [facility] ([address]---this is in the same building as Press [facility]).   You are scheduled on 06-06-17 at 9:00am. You should arrive 15 minutes prior to your appointment time for registration. Please follow the written instructions below on the day of your exam:  WARNING: IF YOU ARE ALLERGIC TO IODINE/X-RAY DYE, PLEASE NOTIFY RADIOLOGY IMMEDIATELY AT [phone]! YOU WILL BE GIVEN A 13 HOUR PREMEDICATION PREP.  1) Do not eat or drink anything after 5:00am (4 hours prior to your test) 2) You have been given 2 bottles of oral contrast to drink. The solution may taste               better if refrigerated, but do NOT add ice or any other liquid to this solution. Shake             well before drinking.    Drink 1 bottle of contrast @ 7:00am (2 hours prior to your exam)  Drink 1 bottle of contrast @ 8:00am (1 hour prior to your exam)  You may take any medications as prescribed with a small amount of water except for the following: Metformin, Glucophage, Glucovance, Avandamet, Riomet, Fortamet, Actoplus Met, Janumet, Glumetza or Metaglip. The above medications must be held the day of the exam AND 48 hours after the exam.  The purpose of you drinking the oral contrast is to aid in the visualization of your intestinal tract. The contrast solution may cause some diarrhea. Before your exam is started, you will be given a small amount of fluid to drink. Depending on your  individual set of symptoms, you may also receive an intravenous injection of x-ray contrast/dye. Plan on being at [facility] for 30 minutes or longer, depending on the type of exam you are having performed.  This test typically takes 30-45 minutes to complete.  If you have any questions regarding your exam or if you need to reschedule, you may call the CT department at [phone] between the hours of 8:00 am and 5:00 pm, Monday-Friday.  ________________________________________________________________________  We have sent the following medications to your pharmacy for you to pick up at your convenience: Omeprazole  Please begin taking a daily fiber supplement.  Please go to the lab in the basement of our building to have lab work done as you leave today.  Thank you for entrusting me with your care, Dr. Kalida Cellar

## 2017-05-30 NOTE — Progress Notes (Signed)
HPI :  71 year old female with a history of lupus, on Imuran and Plaquenil and prednisone, here for a follow-up visit for abdominal pain.  Since her last visit she had a colonoscopy as outlined below. She had a few small polyps removed, one of them was an adenoma. Her sister was diagnosed with colon cancer at the age of 63, her niece past with colon cancer in her 24s, and her brother had a colonoscopy removed in his 45s. She is due for recall colonoscopy in 5 years.  She is here today with main complaint of upper abdominal pain. She states this is been going on for at least 2 years. She reports it as a pulling sensation in her upper abdomen that comes and goes. It lasts 5-10 minutes at a time, multiple times per day, no clear triggers. She denies anything that makes it better or worse. Does not appear positional. She does not have any improvement or worsening with eating. She denies any nausea or vomiting. She denies any weight loss. She does endorse some occasional reflux symptoms and heartburn. She had an ultrasound in 27-Sep-2014 for this issue which did not show any gallstones and a normal gallbladder.  She otherwise states that her stools are thin caliber but she is not constipated. She is a few bowel movements a day, no blood in the stools.  Prior workup Colonoscopy 04/03/2017 - diverticulosis, 2 small polyps removed - one adenoma, recall 5 years US abdomen 12/06/2014 - normal gallbladder, no gallstones  Past Medical History:  Diagnosis Date  . Back stiffness 03/13/2017   Pt.'s Dr. thinks it is Lupus related.  . Diverticulosis   . GERD (gastroesophageal reflux disease)   . History of colon polyps   . HLD (hyperlipidemia)   . Lupus   . Pleural effusion 09/2015     Past Surgical History:  Procedure Laterality Date  . BREAST LUMPECTOMY Right   . REPLACEMENT TOTAL KNEE Right    Family History  Problem Relation Age of Onset  . Colon cancer Sister   . Other Brother        colon surgery,  growth removed  . Colon cancer Other        niece, deceased September 26, 2016  . Asthma Mother    Social History   Tobacco Use  . Smoking status: Never Smoker  . Smokeless tobacco: Never Used  Substance Use Topics  . Alcohol use: No  . Drug use: No   Current Outpatient Medications  Medication Sig Dispense Refill  . azaTHIOprine (IMURAN) 50 MG tablet TK 1 T PO QD FOR 2 WEEKS. INCREASE TO 2 TS QD PO 30 DAYS  2  . Calcium Carb-Cholecalciferol (CALCIUM 500 +D) 500-400 MG-UNIT TABS Take by mouth.    . furosemide (LASIX) 80 MG tablet Take 40 mg by mouth daily.  0  . hydroxychloroquine (PLAQUENIL) 200 MG tablet Take 1 tablet (200 mg total) by mouth daily. (Patient taking differently: Take 400 mg by mouth daily. ) 30 tablet 0  . potassium chloride SA (K-DUR,KLOR-CON) 20 MEQ tablet Take 20 mEq by mouth daily.  0  . predniSONE (DELTASONE) 10 MG tablet Take 5 mg by mouth daily with breakfast.      No current facility-administered medications for this visit.    Allergies  Allergen Reactions  . Penicillins Shortness Of Breath    Has patient had a PCN reaction causing immediate rash, facial/tongue/throat swelling, SOB or lightheadedness with hypotension: Yes Has patient had a PCN reaction causing  severe rash involving mucus membranes or skin necrosis: No Has patient had a PCN reaction that required hospitalization No Has patient had a PCN reaction occurring within the last 10 years: No If all of the above answers are "NO", then may proceed with Cephalosporin use.      Review of Systems: All systems reviewed and negative except where noted in HPI.   Lab Results  Component Value Date   WBC 10.3 10/04/2015   HGB 12.6 10/04/2015   HCT 38.8 10/04/2015   MCV 86.8 10/04/2015   PLT 128 (L) 10/04/2015    Lab Results  Component Value Date   CREATININE 0.86 05/30/2017   BUN 12 05/30/2017   NA 141 05/30/2017   K 3.9 05/30/2017   CL 105 05/30/2017   CO2 29 05/30/2017    Lab Results    Component Value Date   ALT 15 10/04/2015   AST 18 10/04/2015   ALKPHOS 32 (L) 10/04/2015   BILITOT 0.3 10/04/2015     Physical Exam: BP 118/60   Pulse 70   Ht 4\' 11"  (1.499 m)   Wt 73 kg (161 lb)   SpO2 93%   BMI 32.52 kg/m  Constitutional: Pleasant,well-developed, female in no acute distress. HEENT: Normocephalic and atraumatic. Conjunctivae are normal. No scleral icterus. Neck supple.  Cardiovascular: Normal rate, regular rhythm.  Pulmonary/chest: Effort normal and breath sounds normal. No wheezing, rales or rhonchi. Abdominal: Soft, nondistended, nontender. Negative [place]  - unable to reproduce her pain.There are no masses palpable. No hepatomegaly. Extremities: no edema Lymphadenopathy: No cervical adenopathy noted. Neurological: Alert and oriented to person place and time. Skin: Skin is warm and dry. No rashes noted. Psychiatric: Normal mood and affect. Behavior is normal.   ASSESSMENT AND PLAN: 71 year old female here for reassessment following issues:  Upper abdominal pain - ongoing for 2 years without clear etiology. Is not consistent with biliary colic, and her ultrasound is negative for gallstones. She has no prandial association, although does have some occasional reflux which bothers her. Will give her an empiric trial of omeprazole 40 mg once daily to see if this helps. Otherwise no clear triggers or other associations. I'm recommending a CT scan of the abdomen and pelvis with contrast to further evaluate, and hopefully reassure her if normal, as she is very anxious about this symptom. I will contact her with the result. She agreed the plan.  Change in stool form - recent colonoscopy shows no concerning finding, she can use a daily fiber supplement to help regulate stools if needed.  History of colon adenoma / family history of colon cancer - recall colonoscopy in 5 years.  Avinger Cellar, MD [facility] Gastroenterology Pager [phone]

## 2017-06-06 ENCOUNTER — Ambulatory Visit (INDEPENDENT_AMBULATORY_CARE_PROVIDER_SITE_OTHER)
Admission: RE | Admit: 2017-06-06 | Discharge: 2017-06-06 | Disposition: A | Discharge status: Home / Self Care | Payer: Medicare Other | Source: Ambulatory Visit | Attending: Gastroenterology | Admitting: Gastroenterology

## 2017-06-06 DIAGNOSIS — R195 Other fecal abnormalities: Secondary | ICD-10-CM | POA: Diagnosis not present

## 2017-06-06 DIAGNOSIS — R1010 Upper abdominal pain, unspecified: Secondary | ICD-10-CM

## 2017-06-06 DIAGNOSIS — Z79899 Other long term (current) drug therapy: Secondary | ICD-10-CM | POA: Diagnosis not present

## 2017-06-06 DIAGNOSIS — M329 Systemic lupus erythematosus, unspecified: Secondary | ICD-10-CM | POA: Diagnosis not present

## 2017-06-06 DIAGNOSIS — H35361 Drusen (degenerative) of macula, right eye: Secondary | ICD-10-CM | POA: Diagnosis not present

## 2017-06-06 DIAGNOSIS — H40013 Open angle with borderline findings, low risk, bilateral: Secondary | ICD-10-CM | POA: Diagnosis not present

## 2017-06-06 MED ORDER — IOPAMIDOL (ISOVUE-300) INJECTION 61%
100.0000 mL | Freq: Once | INTRAVENOUS | Status: AC | PRN
  Administered 2017-06-06: 100 mL via INTRAVENOUS

## 2017-06-18 DIAGNOSIS — R200 Anesthesia of skin: Secondary | ICD-10-CM | POA: Diagnosis not present

## 2017-06-18 DIAGNOSIS — M546 Pain in thoracic spine: Secondary | ICD-10-CM | POA: Diagnosis not present

## 2017-06-25 ENCOUNTER — Telehealth: Payer: Self-pay | Admitting: Gastroenterology

## 2017-06-25 DIAGNOSIS — R200 Anesthesia of skin: Secondary | ICD-10-CM | POA: Diagnosis not present

## 2017-06-25 DIAGNOSIS — M546 Pain in thoracic spine: Secondary | ICD-10-CM | POA: Diagnosis not present

## 2017-06-25 DIAGNOSIS — M5124 Other intervertebral disc displacement, thoracic region: Secondary | ICD-10-CM | POA: Diagnosis not present

## 2017-06-25 DIAGNOSIS — Z6831 Body mass index (BMI) 31.0-31.9, adult: Secondary | ICD-10-CM | POA: Diagnosis not present

## 2017-06-25 NOTE — Telephone Encounter (Signed)
Spoke to technician, patient did have these done on 05/30/17 with normal results.

## 2017-07-29 ENCOUNTER — Telehealth: Payer: Self-pay | Admitting: Gastroenterology

## 2017-07-29 ENCOUNTER — Other Ambulatory Visit: Payer: Self-pay

## 2017-07-29 DIAGNOSIS — R935 Abnormal findings on diagnostic imaging of other abdominal regions, including retroperitoneum: Secondary | ICD-10-CM

## 2017-07-29 NOTE — Progress Notes (Signed)
amb  

## 2017-07-29 NOTE — Telephone Encounter (Signed)
Patient wishes to schedule EGD, discussed this would need to be at the hospital, also discussed 4/2 date. She is scheduled for 4/2 at [facility]. I tried calling patient back with confirmation date and time. I have mailed her the prep instructions, patient understands that she will need someone there to stay and take her home. Will continue to call patient to verbally tell her date/time.

## 2017-07-30 DIAGNOSIS — M1990 Unspecified osteoarthritis, unspecified site: Secondary | ICD-10-CM | POA: Diagnosis not present

## 2017-07-30 DIAGNOSIS — M546 Pain in thoracic spine: Secondary | ICD-10-CM | POA: Diagnosis not present

## 2017-07-30 DIAGNOSIS — R51 Headache: Secondary | ICD-10-CM | POA: Diagnosis not present

## 2017-07-30 DIAGNOSIS — M329 Systemic lupus erythematosus, unspecified: Secondary | ICD-10-CM | POA: Diagnosis not present

## 2017-07-30 DIAGNOSIS — M8580 Other specified disorders of bone density and structure, unspecified site: Secondary | ICD-10-CM | POA: Diagnosis not present

## 2017-07-30 DIAGNOSIS — Z79899 Other long term (current) drug therapy: Secondary | ICD-10-CM | POA: Diagnosis not present

## 2017-07-30 DIAGNOSIS — E559 Vitamin D deficiency, unspecified: Secondary | ICD-10-CM | POA: Diagnosis not present

## 2017-07-30 NOTE — Telephone Encounter (Signed)
Patient advised of date/time. Aware she will be receiving instructions in the mail, also verbally went over prep instructions.

## 2017-08-21 DIAGNOSIS — E782 Mixed hyperlipidemia: Secondary | ICD-10-CM | POA: Diagnosis not present

## 2017-08-21 DIAGNOSIS — Z79899 Other long term (current) drug therapy: Secondary | ICD-10-CM | POA: Diagnosis not present

## 2017-08-27 ENCOUNTER — Encounter (HOSPITAL_COMMUNITY): Payer: Self-pay | Admitting: Emergency Medicine

## 2017-08-27 ENCOUNTER — Other Ambulatory Visit: Payer: Self-pay

## 2017-08-28 DIAGNOSIS — E8809 Other disorders of plasma-protein metabolism, not elsewhere classified: Secondary | ICD-10-CM | POA: Diagnosis not present

## 2017-08-28 DIAGNOSIS — E782 Mixed hyperlipidemia: Secondary | ICD-10-CM | POA: Diagnosis not present

## 2017-08-28 DIAGNOSIS — M329 Systemic lupus erythematosus, unspecified: Secondary | ICD-10-CM | POA: Diagnosis not present

## 2017-09-08 ENCOUNTER — Other Ambulatory Visit: Payer: Self-pay

## 2017-09-09 ENCOUNTER — Ambulatory Visit (HOSPITAL_COMMUNITY): Payer: Medicare Other | Admitting: Registered Nurse

## 2017-09-09 ENCOUNTER — Encounter (HOSPITAL_COMMUNITY)
Admission: RE | Disposition: A | Discharge status: Home / Self Care | Payer: Self-pay | Source: Ambulatory Visit | Attending: Gastroenterology

## 2017-09-09 ENCOUNTER — Ambulatory Visit (HOSPITAL_COMMUNITY)
Admission: RE | Admit: 2017-09-09 | Discharge: 2017-09-09 | Disposition: A | Discharge status: Home / Self Care | Payer: Medicare Other | Source: Ambulatory Visit | Attending: Gastroenterology | Admitting: Gastroenterology

## 2017-09-09 ENCOUNTER — Other Ambulatory Visit: Payer: Self-pay

## 2017-09-09 ENCOUNTER — Encounter (HOSPITAL_COMMUNITY): Payer: Self-pay | Admitting: Registered Nurse

## 2017-09-09 DIAGNOSIS — R933 Abnormal findings on diagnostic imaging of other parts of digestive tract: Secondary | ICD-10-CM | POA: Diagnosis not present

## 2017-09-09 DIAGNOSIS — D175 Benign lipomatous neoplasm of intra-abdominal organs: Secondary | ICD-10-CM | POA: Diagnosis not present

## 2017-09-09 DIAGNOSIS — R935 Abnormal findings on diagnostic imaging of other abdominal regions, including retroperitoneum: Secondary | ICD-10-CM

## 2017-09-09 DIAGNOSIS — Z96651 Presence of right artificial knee joint: Secondary | ICD-10-CM | POA: Diagnosis not present

## 2017-09-09 DIAGNOSIS — E785 Hyperlipidemia, unspecified: Secondary | ICD-10-CM | POA: Diagnosis not present

## 2017-09-09 DIAGNOSIS — Z880 Allergy status to penicillin: Secondary | ICD-10-CM | POA: Diagnosis not present

## 2017-09-09 DIAGNOSIS — R1013 Epigastric pain: Secondary | ICD-10-CM

## 2017-09-09 DIAGNOSIS — Z79899 Other long term (current) drug therapy: Secondary | ICD-10-CM | POA: Diagnosis not present

## 2017-09-09 DIAGNOSIS — K3189 Other diseases of stomach and duodenum: Secondary | ICD-10-CM | POA: Diagnosis not present

## 2017-09-09 DIAGNOSIS — M329 Systemic lupus erythematosus, unspecified: Secondary | ICD-10-CM | POA: Insufficient documentation

## 2017-09-09 DIAGNOSIS — Z86010 Personal history of colonic polyps: Secondary | ICD-10-CM | POA: Diagnosis not present

## 2017-09-09 DIAGNOSIS — Z8719 Personal history of other diseases of the digestive system: Secondary | ICD-10-CM | POA: Diagnosis not present

## 2017-09-09 DIAGNOSIS — K449 Diaphragmatic hernia without obstruction or gangrene: Secondary | ICD-10-CM

## 2017-09-09 DIAGNOSIS — Z800 Family history of malignant neoplasm of digestive organs: Secondary | ICD-10-CM | POA: Diagnosis not present

## 2017-09-09 DIAGNOSIS — K319 Disease of stomach and duodenum, unspecified: Secondary | ICD-10-CM | POA: Diagnosis not present

## 2017-09-09 DIAGNOSIS — K219 Gastro-esophageal reflux disease without esophagitis: Secondary | ICD-10-CM | POA: Diagnosis not present

## 2017-09-09 HISTORY — PX: ESOPHAGOGASTRODUODENOSCOPY: SHX5428

## 2017-09-09 SURGERY — EGD (ESOPHAGOGASTRODUODENOSCOPY)
Anesthesia: Monitor Anesthesia Care

## 2017-09-09 MED ORDER — LACTATED RINGERS IV SOLN
INTRAVENOUS | Status: DC
  Administered 2017-09-09: 1000 mL via INTRAVENOUS

## 2017-09-09 MED ORDER — ONDANSETRON HCL 4 MG/2ML IJ SOLN
INTRAMUSCULAR | Status: DC | PRN
  Administered 2017-09-09: 4 mg via INTRAVENOUS

## 2017-09-09 MED ORDER — SODIUM CHLORIDE 0.9 % IV SOLN: INTRAVENOUS | Status: DC

## 2017-09-09 MED ORDER — LIDOCAINE 2% (20 MG/ML) 5 ML SYRINGE
INTRAMUSCULAR | Status: DC | PRN
  Administered 2017-09-09: 40 mg via INTRAVENOUS

## 2017-09-09 MED ORDER — PROPOFOL 10 MG/ML IV BOLUS
INTRAVENOUS | Status: DC | PRN
  Administered 2017-09-09 (×6): 20 mg via INTRAVENOUS
  Administered 2017-09-09: 30 mg via INTRAVENOUS
  Administered 2017-09-09 (×2): 20 mg via INTRAVENOUS

## 2017-09-09 MED ORDER — PROPOFOL 10 MG/ML IV BOLUS
INTRAVENOUS | Status: AC
  Filled 2017-09-09: qty 40

## 2017-09-09 MED ORDER — PREDNISONE 10 MG PO TABS
10.0000 mg | ORAL_TABLET | Freq: Every day | ORAL | Status: DC
  Administered 2017-09-09: 10 mg via ORAL
  Filled 2017-09-09: qty 1

## 2017-09-09 NOTE — Interval H&P Note (Signed)
History and Physical Interval Note:  09/09/2017 8:11 AM  Jane Chavez  has presented today for surgery, with the diagnosis of Abnormal CT abdomen, lesions in stomach  The various methods of treatment have been discussed with the patient and family. After consideration of risks, benefits and other options for treatment, the patient has consented to  Procedure(s): ESOPHAGOGASTRODUODENOSCOPY (EGD) (N/A) as a surgical intervention .  The patient's history has been reviewed, patient examined, no change in status, stable for surgery.  I have reviewed the patient's chart and labs.  Questions were answered to the patient's satisfaction.     [place]

## 2017-09-09 NOTE — Op Note (Signed)
[facility] [address] Patient Name: Jane Chavez Procedure Date: 09/09/2017 MRN: [id number] Attending MD: Carlota Raspberry. Armbruster MD, MD Date of Birth: 17-Jan-1946 CSN: [id number] Age: 72 Admit Type: Outpatient Procedure:                Upper GI endoscopy Indications:              Abnormal CT of the GI tract- suspected lipoma                            versus other gastric lesion, upper abdominal pain Providers:                Carlota Raspberry. Armbruster MD, MD, Cleda Daub, RN,                            Laurena Spies, Technician, Lajuana Carry, CRNA Referring MD:              Medicines:                Monitored Anesthesia Care Complications:            No immediate complications. Estimated blood loss:                            Minimal. Estimated Blood Loss:     Estimated blood loss was minimal. Procedure:                Pre-Anesthesia Assessment:                           - Prior to the procedure, a History and Physical                            was performed, and patient medications and                            allergies were reviewed. The patient's tolerance of                            previous anesthesia was also reviewed. The risks                            and benefits of the procedure and the sedation                            options and risks were discussed with the patient.                            All questions were answered, and informed consent                            was obtained. Prior Anticoagulants: The patient has                            taken no previous anticoagulant or antiplatelet  agents. ASA Grade Assessment: II - A patient with                            mild systemic disease. After reviewing the risks                            and benefits, the patient was deemed in                            satisfactory condition to undergo the procedure.                           After obtaining informed consent, the endoscope was                             passed under direct vision. Throughout the                            procedure, the patient's blood pressure, pulse, and                            oxygen saturations were monitored continuously. The                            [id number] ([id number]) scope was introduced through the                            mouth, and advanced to the second part of duodenum.                            The upper GI endoscopy was accomplished without                            difficulty. The patient tolerated the procedure                            well. Scope In: Scope Out: Findings:      Esophagogastric landmarks were identified: the Z-line was found at 31       cm, the gastroesophageal junction was found at 31 cm and the upper       extent of the gastric folds was found at 34 cm from the incisors.      A 3 cm hiatal hernia was present.      The exam of the esophagus was otherwise normal.      There was a large lipoma in the gastric antrum. It was broad based, not       pedunculated, did not appear to have the ability to obstruct the pylorus       given its location and these features, in regards to her symptoms. It       was soft to probing with the forceps. Multiple bite on biopsies were       taken with a cold forceps for histology.      The exam of the stomach was otherwise normal.      Biopsies were taken with a  cold forceps in the gastric body, at the       incisura and in the gastric antrum for Helicobacter pylori testing.      The duodenal bulb and second portion of the duodenum were normal. Impression:               - Esophagogastric landmarks identified.                           - 3 cm hiatal hernia.                           - Normal esophagus otherwise.                           - Suspected broad-based large gastric lipoma of the                            antrum. Biopsied as above. This lesion is high risk                            for endoscopic removal, if  removal was considered                            this may require surgical resection. I think it may                            be unikely to be causing the patient's symptoms                            given the features described as above.                           - Normal stomach otherwise, biopsies taken to rule                            out H pylori.                           - Normal duodenal bulb and second portion of the                            duodenum. Moderate Sedation:      No moderate sedation, case performed with MAC Recommendation:           - Patient has a contact number available for                            emergencies. The signs and symptoms of potential                            delayed complications were discussed with the                            patient. Return to normal activities tomorrow.  Written discharge instructions were provided to the                            patient.                           - Resume previous diet.                           - Continue present medications.                           - Await pathology results with further                            recommendations Procedure Code(s):        --- Professional ---                           [phone], Esophagogastroduodenoscopy, flexible,                            transoral; with biopsy, single or multiple Diagnosis Code(s):        --- Professional ---                           K44.9, Diaphragmatic hernia without obstruction or                            gangrene                           D17.5, Benign lipomatous neoplasm of                            intra-abdominal organs                           R93.3, Abnormal findings on diagnostic imaging of                            other parts of digestive tract CPT copyright 2017 American Medical Association. All rights reserved. The codes documented in this report are preliminary and upon coder review may  be  revised to meet current compliance requirements. Remo Lipps P. Armbruster MD, MD 09/09/2017 9:15:34 AM This report has been signed electronically. Number of Addenda: 0

## 2017-09-09 NOTE — Anesthesia Preprocedure Evaluation (Addendum)
Anesthesia Evaluation  Patient identified by MRN, date of birth, ID band Patient awake    Reviewed: Allergy & Precautions, NPO status , Patient's Chart, lab work & pertinent test results  Airway Mallampati: II  TM Distance: >3 FB     Dental   Pulmonary    breath sounds clear to auscultation       Cardiovascular negative cardio ROS   Rhythm:Regular Rate:Normal     Neuro/Psych    GI/Hepatic Neg liver ROS, GERD  ,  Endo/Other  negative endocrine ROS  Renal/GU negative Renal ROS     Musculoskeletal   Abdominal   Peds  Hematology   Anesthesia Other Findings   Reproductive/Obstetrics                             Anesthesia Physical Anesthesia Plan  ASA: III  Anesthesia Plan: MAC   Post-op Pain Management:    Induction: Intravenous  PONV Risk Score and Plan: Treatment may vary due to age or medical condition  Airway Management Planned: Simple Face Mask and Nasal Cannula  Additional Equipment:   Intra-op Plan:   Post-operative Plan:   Informed Consent:   Dental advisory given  Plan Discussed with: Anesthesiologist and CRNA  Anesthesia Plan Comments:         Anesthesia Quick Evaluation

## 2017-09-09 NOTE — Transfer of Care (Signed)
Immediate Anesthesia Transfer of Care Note  Patient: [name]  Procedure(s) Performed: ESOPHAGOGASTRODUODENOSCOPY (EGD) (N/A )  Patient Location: PACU  Anesthesia Type:MAC  Level of Consciousness: sedated  Airway & Oxygen Therapy: Patient Spontanous Breathing and Patient connected to nasal cannula oxygen  Post-op Assessment: Report given to RN and Post -op Vital signs reviewed and stable  Post vital signs: Reviewed and stable  Last Vitals:  Vitals Value Taken Time  BP 141/109 09/09/2017  9:05 AM  Temp    Pulse 100 09/09/2017  9:06 AM  Resp 21 09/09/2017  9:06 AM  SpO2 98 % 09/09/2017  9:06 AM  Vitals shown include unvalidated device data.  Last Pain:  Vitals:   09/09/17 0820  TempSrc: Oral  PainSc: 0-No pain         Complications: No apparent anesthesia complications

## 2017-09-09 NOTE — Discharge Instructions (Signed)
Endoscopic Retrograde Cholangiopancreatogram, Care After °This sheet gives you information about how to care for yourself after your procedure. Your health care provider may also give you more specific instructions. If you have problems or questions, contact your health care provider. °What can I expect after the procedure? °After the procedure, it is common to have: °· Soreness in your throat. °· Nausea. °· Bloating. °· Dizziness. °· Tiredness (fatigue). ° °Follow these instructions at home: °· Take over-the-counter and prescription medicines only as told by your health care provider. °· Do not drive for 24 hours if you were given a medicine to help you relax (sedative) during your procedure. Have someone stay with you for 24 hours after the procedure. °· Return to your normal activities as told by your health care provider. Ask your health care provider what activities are safe for you. °· Return to eating what you normally do as soon as you feel well enough or as told by your health care provider. °· Keep all follow-up visits as told by your health care provider. This is important. °Contact a health care provider if: °· You have pain in your abdomen that does not get better with medicine. °· You develop signs of infection, such as: °? Chills. °? Feeling unwell. °Get help right away if: °· You have difficulty swallowing. °· You have worsening pain in your throat, chest, or abdomen. °· You vomit bright red blood or a substance that looks like coffee grounds. °· You have bloody or very black stools. °· You have a fever. °· You have a sudden increase in swelling (bloating) in your abdomen. °Summary °· After the procedure, it is common to feel tired and to have some discomfort in your throat. °· Contact your health care provider if you have signs of infection--such as chills or feeling unwell--or if you have pain that does not improve with medicine. °· Get help right away if you have trouble swallowing, worsening  pain, bloody or black vomit, bloody or black stools, a fever, or increased swelling in your abdomen. °· Keep all follow-up visits as told by your health care provider. This is important. °This information is not intended to replace advice given to you by your health care provider. Make sure you discuss any questions you have with your health care provider. °Document Released: 03/17/2013 Document Revised: 04/15/2016 Document Reviewed: 04/15/2016 °Elsevier Interactive Patient Education © 2017 Elsevier Inc. ° °

## 2017-09-09 NOTE — Anesthesia Procedure Notes (Signed)
Date/Time: 09/09/2017 8:43 AM Performed by: Talbot Grumbling, CRNA Oxygen Delivery Method: Nasal cannula

## 2017-09-09 NOTE — H&P (Signed)
HPI:   Jane Chavez is a 72 y.o. female here for EGD to evaluate CT scan findings.  She has been having ongoing abdominal pain. CT shows suspected gastric lipoma in the antrum versus other gastric lesion. Unclear if related to symptoms or not but EGD was recommended to further evaluate. She reports ongoing abdominal pain in the upper abdomen for the past 1.5 years, intermittent, no vomiting. Pain usually not related to eating.     Past Medical History:  Diagnosis Date  . Back stiffness 03/13/2017   Pt.'s Dr. thinks it is Lupus related.  . Diverticulosis   . GERD (gastroesophageal reflux disease)   . History of colon polyps   . HLD (hyperlipidemia)   . Lupus ([place])   . Pleural effusion 09/2015    Past Surgical History:  Procedure Laterality Date  . BREAST LUMPECTOMY Right   . REPLACEMENT TOTAL KNEE Right     Family History  Problem Relation Age of Onset  . Colon cancer Sister   . Other Brother        colon surgery, growth removed  . Colon cancer Other        niece, deceased 09/26/2016  . Asthma Mother      Social History   Tobacco Use  . Smoking status: Never Smoker  . Smokeless tobacco: Never Used  Substance Use Topics  . Alcohol use: No  . Drug use: No    Prior to Admission medications   Medication Sig Start Date End Date Taking? Authorizing Provider  azaTHIOprine (IMURAN) 50 MG tablet Take 100 mg by mouth daily 03/11/17  Yes [provider]  Calcium Carb-Cholecalciferol (CALCIUM 500 +D) 500-400 MG-UNIT TABS Take 1 tablet by mouth daily.    Yes [provider]  carboxymethylcellul-glycerin (REFRESH OPTIVE) 0.5-0.9 % ophthalmic solution Place 1 drop into both eyes 2 (two) times daily as needed for dry eyes.   Yes [provider]  furosemide (LASIX) 80 MG tablet Take 80 mg by mouth daily.  10/19/15  Yes [provider]  hydroxychloroquine (PLAQUENIL) 200 MG tablet Take 1 tablet (200 mg total) by mouth  daily. Patient taking differently: Take 400 mg by mouth daily.  10/05/15  Yes Samuella Cota, MD  omeprazole (PRILOSEC) 40 MG capsule Take 1 capsule (40 mg total) by mouth daily. 05/30/17  Yes Jaymir Struble, Carlota Raspberry, MD  potassium chloride SA (K-DUR,KLOR-CON) 20 MEQ tablet Take 20 mEq by mouth daily. 10/19/15  Yes [provider]  predniSONE (DELTASONE) 10 MG tablet Take 10 mg by mouth daily with breakfast.    Yes [provider]    Current Facility-Administered Medications  Medication Dose Route Frequency Provider Last Rate Last Dose  . 0.9 %  sodium chloride infusion   Intravenous Continuous Shontell Prosser, Carlota Raspberry, MD        Allergies as of 07/29/2017 - Review Complete 06/06/2017  Allergen Reaction Noted  . Penicillins Shortness Of Breath 12/03/2012     Review of Systems:    As per HPI, otherwise negative    Physical Exam:  Vital signs in last 24 hours:     General:   Pleasant female in NAD Lungs:  Respirations even and unlabored. Lungs clear to auscultation bilaterally.   No wheezes, crackles, or rhonchi.  Heart:  Regular rate and rhythm; no MRG Abdomen:  Soft, nondistended, nontender.  No appreciable masses or hepatomegaly.  Msk:  Symmetrical without gross  deformities.  Extremities:  Without edema. Neurologic:  Alert and  oriented x4;  grossly normal neurologically.  Lab Results  Component Value Date   WBC 10.3 10/04/2015   HGB 12.6 10/04/2015   HCT 38.8 10/04/2015   MCV 86.8 10/04/2015   PLT 128 (L) 10/04/2015    Lab Results  Component Value Date   CREATININE 0.86 05/30/2017   BUN 12 05/30/2017   NA 141 05/30/2017   K 3.9 05/30/2017   CL 105 05/30/2017   CO2 29 05/30/2017    Lab Results  Component Value Date   ALT 15 10/04/2015   AST 18 10/04/2015   ALKPHOS 32 (L) 10/04/2015   BILITOT 0.3 10/04/2015   Lab Results  Component Value Date   INR 0.87 10/03/2015   INR 0.93 09/22/2015       Impression / Plan:   72 y/o female with  history of upper abdominal pain, CT scan shows possible lipoma versus other gastric lesion. Unclear if this lesion is related to symptoms or not. I recommend EGD to further evaluate, potentially remove if amenable / indicated. I discussed risks / benefits of EGD and anesthesia, she wishes to proceed. Further recommendations pending the results.   Bruno Cellar, MD [facility] Gastroenterology Pager [phone]

## 2017-09-09 NOTE — Anesthesia Postprocedure Evaluation (Signed)
Anesthesia Post Note  Patient: Jane Chavez  Procedure(s) Performed: ESOPHAGOGASTRODUODENOSCOPY (EGD) (N/A )     Patient location during evaluation: Endoscopy Anesthesia Type: MAC Level of consciousness: awake Pain management: pain level controlled Vital Signs Assessment: post-procedure vital signs reviewed and stable Respiratory status: spontaneous breathing Cardiovascular status: stable Anesthetic complications: no    Last Vitals:  Vitals:   09/09/17 0909 09/09/17 0920  BP:  126/82  Pulse: 96 85  Resp: 17 16  Temp:    SpO2: 100% 99%    Last Pain:  Vitals:   09/09/17 0920  TempSrc:   PainSc: 0-No pain                 Geovanny Sartin

## 2017-09-10 ENCOUNTER — Encounter (HOSPITAL_COMMUNITY): Payer: Self-pay | Admitting: Gastroenterology

## 2017-09-11 ENCOUNTER — Encounter: Payer: Self-pay | Admitting: Gastroenterology

## 2017-10-08 DIAGNOSIS — M329 Systemic lupus erythematosus, unspecified: Secondary | ICD-10-CM | POA: Diagnosis not present

## 2017-10-08 DIAGNOSIS — M1990 Unspecified osteoarthritis, unspecified site: Secondary | ICD-10-CM | POA: Diagnosis not present

## 2017-10-08 DIAGNOSIS — M25562 Pain in left knee: Secondary | ICD-10-CM | POA: Diagnosis not present

## 2017-10-08 DIAGNOSIS — Z79899 Other long term (current) drug therapy: Secondary | ICD-10-CM | POA: Diagnosis not present

## 2017-10-08 DIAGNOSIS — R51 Headache: Secondary | ICD-10-CM | POA: Diagnosis not present

## 2017-10-08 DIAGNOSIS — M8580 Other specified disorders of bone density and structure, unspecified site: Secondary | ICD-10-CM | POA: Diagnosis not present

## 2017-10-08 DIAGNOSIS — E559 Vitamin D deficiency, unspecified: Secondary | ICD-10-CM | POA: Diagnosis not present

## 2018-01-20 ENCOUNTER — Telehealth: Payer: Self-pay | Admitting: Internal Medicine

## 2018-01-20 NOTE — Telephone Encounter (Signed)
Spoke with Anderson Malta from [employer] to see if there were any research pts scheduled Monday, 01/26/18 and per Anderson Malta, there were none so we can use that research slot for pt.  Attempted to call pt but no answer. Left message for pt to return call x1.

## 2018-01-20 NOTE — Telephone Encounter (Signed)
Can add in research slot for 01/26/18 provided cleared by  [employer]

## 2018-01-21 NOTE — Telephone Encounter (Signed)
Pt has been scheduled for acute visit with MR on 01/26/18 at 10:30a. Pt aware and voiced her understanding. Nothing further is needed.

## 2018-01-21 NOTE — Telephone Encounter (Signed)
Pt is returning call. Cb is [phone]

## 2018-01-26 ENCOUNTER — Other Ambulatory Visit (INDEPENDENT_AMBULATORY_CARE_PROVIDER_SITE_OTHER): Payer: Medicare Other

## 2018-01-26 ENCOUNTER — Ambulatory Visit (INDEPENDENT_AMBULATORY_CARE_PROVIDER_SITE_OTHER)
Admission: RE | Admit: 2018-01-26 | Discharge: 2018-01-26 | Disposition: A | Discharge status: Home / Self Care | Payer: Medicare Other | Source: Ambulatory Visit | Attending: Internal Medicine | Admitting: Internal Medicine

## 2018-01-26 ENCOUNTER — Ambulatory Visit (INDEPENDENT_AMBULATORY_CARE_PROVIDER_SITE_OTHER): Payer: Medicare Other | Admitting: Internal Medicine

## 2018-01-26 ENCOUNTER — Encounter: Payer: Self-pay | Admitting: Internal Medicine

## 2018-01-26 VITALS — BP 132/72 | HR 83 | Ht <= 58 in | Wt 163.0 lb

## 2018-01-26 DIAGNOSIS — M329 Systemic lupus erythematosus, unspecified: Secondary | ICD-10-CM

## 2018-01-26 DIAGNOSIS — Z86711 Personal history of pulmonary embolism: Secondary | ICD-10-CM | POA: Diagnosis not present

## 2018-01-26 DIAGNOSIS — R0602 Shortness of breath: Secondary | ICD-10-CM

## 2018-01-26 DIAGNOSIS — Z8709 Personal history of other diseases of the respiratory system: Secondary | ICD-10-CM | POA: Diagnosis not present

## 2018-01-26 DIAGNOSIS — R0789 Other chest pain: Secondary | ICD-10-CM | POA: Diagnosis not present

## 2018-01-26 DIAGNOSIS — R0600 Dyspnea, unspecified: Secondary | ICD-10-CM | POA: Diagnosis not present

## 2018-01-26 LAB — BASIC METABOLIC PANEL
BUN: 14 mg/dL (ref 6–23)
CO2: 30 mEq/L (ref 19–32)
Calcium: 9.5 mg/dL (ref 8.4–10.5)
Chloride: 106 mEq/L (ref 96–112)
Creatinine, Ser: 0.96 mg/dL (ref 0.40–1.20)
GFR: 73.88 mL/min (ref >60.00)
Glucose, Bld: 87 mg/dL (ref 70–99)
Potassium: 3.5 mEq/L (ref 3.5–5.1)
Sodium: 142 mEq/L (ref 135–145)

## 2018-01-26 LAB — D-DIMER, QUANTITATIVE: D-Dimer, Quant: 2 mcg/mL FEU — ABNORMAL HIGH (ref <0.50)

## 2018-01-26 NOTE — Progress Notes (Signed)
Subjective:     Patient ID: Jane Chavez, female   DOB: 1945-11-26, 72 y.o.   MRN: [id number]  HPI  Chief Complaint  Patient presents with  . Follow-up    Referring provider: [name], MD  HPI: 72 year old female never smoker with lupus seen for pulmonary consult during hospitalization 2017 for bilateral effusions. Patient has grade 2 diastolic congestive heart failure  Jane Chavez is a 72 y.o. female with Lupus off medical therapies >5 years. Now on prednisone and Plaquenil, grade II diastolic congestive heart failure, thrombocytopenia, and history of bilateral pleural effusions.   10/23/2015 Hospital Follow Up: Patient presents to the office for hospital follow-up of bilateral pleural effusions which were treated with bilateral thoracentesis.She had been off treatment for her Lupus for >5 years, is now on prednisone, lasix  and Plaquenil. She denies shortness of breath and states she is feeling much better.Denies chest pain, SOB, fever, leg or calf pain.She denies cough. She has noted swelling in her lower extremities. She is compliant with lasix treatment, prednisone, Plaquenil and Lipitor. She has followed up with Dr. Dossie Der, rheumatology, and is following up with her PCP next week. She is currently on 80 mg of prednisone daily which is being managed by Dr. Dossie Der..  Significant Events/Procedures:  Admit date: 10/03/2015 Discharge date: 10/05/2015  Principal Problem:  Bilateral pleural effusion (Recurrent) Active Problems:  Acute respiratory failure with hypoxia (HCC)  Left ventricular diatolic dysfunction, NYHA class 2  SLE (systemic lupus erythematosus) (HCC)  Recurrent pleural effusion on left  Pleural effusion, bilateral  SOB (shortness of breath)  Procedures:  1. 4/25 left thoracentesis yielding 1.45 L clear yellow fluid 2. 4/26 right thoracentesis Yielded 1 liter of clear yellow fluid. 3. 09/22/2015: Echo: 65-70%  EF  10/23/2015: CXR: Small-to-moderate-sized bilateral pleural effusions slightly less than that seen on October 04 2015. There is no pneumothorax.   OV 10/16/2016  Chief Complaint  Patient presents with  . Follow-up    pt states breathing is ok but she does get a little winded here and there chest xray showed fluid on her lungs so Dr. Dorathy Daft told her to see her pulmonologist   72 year old African-American female who I'm seeing for the first time. Last seen in our office just over a year ago. Based on review of the chart and her history she tells me that she is living [place] and was diagnosed with lupus of the joints and skin rash over 10 years ago. Some 6 years ago she moved to [place]. She was already on chronic prednisone. Some 4 years ago she establish with [facility] rheumatology Department. Since then she's been on Plaquenil. Then approximately in April 2017 started developing bilateral pleural effusions but review of the chart shows transudate and nondiagnostic cytology on 3 occasions all in April 2017. She's been maintained on Lasix with potassium. This has kept her under control. Then she reports that earlier this year she saw her Medicare physician Dr. Ashby Dawes and chest x-ray suggested pleural effusion. Indicates approximately a month ago. The same thing is some pleural effusion. She only has mild dyspnea on extreme exertion. She's been referred back to pulmonary. I do not have the most recent chest x-ray from primary care physician's office. But I did  visualize all the 2017 films and I concur with the report. Other than that she feels well.   11/05/2016 Follow up : Pleural effusions Patient returns for a two-week follow-up. Patient was seen in the office for evaluation of  bilateral pleural effusions. Patient was seen in during hospitalization. April 2017 for bilateral pleural effusions. Patient underwent thoracentesis twice in April 2017. Was felt to be  transudate of effusions. Patient was felt this may be secondary to her lupus. And was referred to rheumatology. She is followed by rheumatology and is currently on prednisone 10 mg and Plaquenil.  Echo April 2017 showed normal EF and grade 2 diastolic dysfunction. Patient is on Lasix 80 mg daily .Seen by PCP last month told bnp was nml .  Last visit. Patient was set up for a thoracentesis or left pleural effusion with 330 cc of pleural fluid removed. It appeared to be transudate. Cytology was negative for malignant cells..  Ok Edwards 01/26/2018  Chief Complaint  Patient presents with  . Acute Visit    Pt has been having pain in lungs x2 weeks. Pt states the pain is mainly pressure and she is also having pain in her back. Pt is also having SOB and tightness in chest. Pt denies any cough.   72 year old female personally not seen in this practice for over a year. She is known to have lupus with diastolic dysfunctionand transudative left pleural effusion over a year ago. She follows with Dr. Dossie Der for her lupus which is treated with Imuran, prednisone and Plaquenil. She is here acutely because for the last few to several weeks she's had upper back suprascapular and infrascapular spasms with pain associated with deep inspiration but she also has palpable tenderness in this area. She also feels heavy in the central chest area and particularly feels dyspneic when she lies down. She also feels like she has to take a good deep breath in order to feelthat she is getting enough oxygen. Dyspnea is also worse when she exerts herself. She feels that her pleural effusion might be coming back she also feels that it might be similar to her pulmonary embolism that she suffered 20 years ago while in [place] [is a new history that we got today] for which she took Coumadin at that time. This no orthopnea or edema or paroxysmal nocturnal dyspnea  She had CT abdomen in December 2018 lung cutthat I personally visualized showed very  minimal pleural effusion. December 2018 creatinine was normal   has a past medical history of Back stiffness (03/13/2017), Diverticulosis, GERD (gastroesophageal reflux disease), History of colon polyps, HLD (hyperlipidemia), Lupus (Early), and Pleural effusion (09/2015).   reports that she has never smoked. She has never used smokeless tobacco.  Past Surgical History:  Procedure Laterality Date  . BREAST LUMPECTOMY Right   . ESOPHAGOGASTRODUODENOSCOPY N/A 09/09/2017   Procedure: ESOPHAGOGASTRODUODENOSCOPY (EGD);  Surgeon: Yetta Flock, MD;  Location: [facility];  Service: Gastroenterology;  Laterality: N/A;  . REPLACEMENT TOTAL KNEE Right     Allergies  Allergen Reactions  . Penicillins Shortness Of Breath    Has patient had a PCN reaction causing immediate rash, facial/tongue/throat swelling, SOB or lightheadedness with hypotension: Yes Has patient had a PCN reaction causing severe rash involving mucus membranes or skin necrosis: No Has patient had a PCN reaction that required hospitalization No Has patient had a PCN reaction occurring within the last 10 years: No If all of the above answers are "NO", then may proceed with Cephalosporin use.     Immunization History  Administered Date(s) Administered  . Influenza, High Dose Seasonal PF 04/10/2017  . Influenza,inj,Quad PF,6+ Mos 04/10/2016    Family History  Problem Relation Age of Onset  . Colon cancer Sister   .  Other Brother        colon surgery, growth removed  . Colon cancer Other        niece, deceased 2016-09-23  . Asthma Mother      Current Outpatient Medications:  .  azaTHIOprine (IMURAN) 50 MG tablet, Take 100 mg by mouth daily, Disp: , Rfl: 2 .  Calcium Carb-Cholecalciferol (CALCIUM 500 +D) 500-400 MG-UNIT TABS, Take 1 tablet by mouth daily. , Disp: , Rfl:  .  carboxymethylcellul-glycerin (REFRESH OPTIVE) 0.5-0.9 % ophthalmic solution, Place 1 drop into both eyes 2 (two) times daily as needed for dry  eyes., Disp: , Rfl:  .  furosemide (LASIX) 80 MG tablet, Take 80 mg by mouth daily. , Disp: , Rfl: 0 .  hydroxychloroquine (PLAQUENIL) 200 MG tablet, Take 1 tablet (200 mg total) by mouth daily. (Patient taking differently: Take 400 mg by mouth daily. ), Disp: 30 tablet, Rfl: 0 .  potassium chloride SA (K-DUR,KLOR-CON) 20 MEQ tablet, Take 20 mEq by mouth daily., Disp: , Rfl: 0 .  predniSONE (DELTASONE) 10 MG tablet, Take 10 mg by mouth daily with breakfast. , Disp: , Rfl:     Review of Systems     Objective:   Physical Exam  Constitutional: She is oriented to person, place, and time. She appears well-developed and well-nourished. No distress.  HENT:  Head: Normocephalic and atraumatic.  Right Ear: External ear normal.  Left Ear: External ear normal.  Mouth/Throat: Oropharynx is clear and moist. No oropharyngeal exudate.  Eyes: Pupils are equal, round, and reactive to light. Conjunctivae and EOM are normal. Right eye exhibits no discharge. Left eye exhibits no discharge. No scleral icterus.  Neck: Normal range of motion. Neck supple. No JVD present. No tracheal deviation present. No thyromegaly present.  Cardiovascular: Normal rate, regular rhythm, normal heart sounds and intact distal pulses. Exam reveals no gallop and no friction rub.  No murmur heard. Pulmonary/Chest: Effort normal and breath sounds normal. No respiratory distress. She has no wheezes. She has no rales. She exhibits no tenderness.  Abdominal: Soft. Bowel sounds are normal. She exhibits no distension and no mass. There is no tenderness. There is no rebound and no guarding.  Musculoskeletal: Normal range of motion. She exhibits no edema or tenderness.  Lymphadenopathy:    She has no cervical adenopathy.  Neurological: She is alert and oriented to person, place, and time. She has normal reflexes. No cranial nerve deficit. She exhibits normal muscle tone. Coordination normal.  Skin: Skin is warm and dry. No rash noted. She  is not diaphoretic. No erythema. No pallor.  Psychiatric: She has a normal mood and affect. Her behavior is normal. Judgment and thought content normal.  Vitals reviewed.  Vitals:   01/26/18 1051  BP: 132/72  Pulse: 83  SpO2: 100%  Weight: 163 lb (73.9 kg)  Height: 1\' 11"  (0.584 m)    Estimated body mass index is 216.64 kg/m as calculated from the following:   Height as of this encounter: 1\' 11"  (0.584 m).   Weight as of this encounter: 163 lb (73.9 kg).     Assessment:       ICD-10-CM   1. Shortness of breath R06.02   2. Systemic lupus erythematosus, unspecified SLE type, unspecified organ involvement status ([place]) M32.9   3. History of pleural effusion Z87.09   4. History of pulmonary embolus (PE) Z86.711   5. Chest tightness R07.89        Plan:     - ymptoms  sound atypical for any specific etiology but given her previous history of pleural effusions and pulmonary embolism these need to be ruled out. Assuming these are fine then we will proceed to pulmonary hypertension workup. This is because she is a candidate for all of these given underlying lupus  Will first get a d-dimer and if this is abnormal we'll proceed to CT angiogram chest [creatinine in December 2018 was normal.  We'll also get a chest x-ray  Dr. Brand Males, M.D., [facility].C.P Pulmonary and Critical Care Medicine Staff Physician, [place] Director - Interstitial Lung Disease  Program  P[facility] [place], [place], [address]  Pager: [phone], If no answer or between  15:00h - 7:00h: call [phone] Telephone: [phone]

## 2018-01-26 NOTE — Addendum Note (Signed)
Addended by: Lorretta Harp on: 01/26/2018 11:35 AM   Modules accepted: Orders

## 2018-01-26 NOTE — Patient Instructions (Addendum)
ICD-10-CM   1. Shortness of breath R06.02   2. Systemic lupus erythematosus, unspecified SLE type, unspecified organ involvement status ([place]) M32.9   3. History of pleural effusion Z87.09   4. History of pulmonary embolus (PE) Z86.711   5. Chest tightness R07.89     Get cxr 2 view  Get d-dimer and bmet  blood test - if d-dimer  abnormal and creatinine normal will get CTA  Chest  Followup Result dependent

## 2018-01-27 ENCOUNTER — Telehealth: Payer: Self-pay | Admitting: Internal Medicine

## 2018-01-27 ENCOUNTER — Ambulatory Visit (HOSPITAL_COMMUNITY)
Admission: RE | Admit: 2018-01-27 | Discharge: 2018-01-27 | Disposition: A | Discharge status: Home / Self Care | Payer: Medicare Other | Source: Ambulatory Visit | Attending: Internal Medicine | Admitting: Internal Medicine

## 2018-01-27 DIAGNOSIS — R7989 Other specified abnormal findings of blood chemistry: Secondary | ICD-10-CM

## 2018-01-27 DIAGNOSIS — J9811 Atelectasis: Secondary | ICD-10-CM | POA: Diagnosis not present

## 2018-01-27 DIAGNOSIS — I517 Cardiomegaly: Secondary | ICD-10-CM | POA: Diagnosis not present

## 2018-01-27 DIAGNOSIS — E010 Iodine-deficiency related diffuse (endemic) goiter: Secondary | ICD-10-CM | POA: Insufficient documentation

## 2018-01-27 DIAGNOSIS — M4854XA Collapsed vertebra, not elsewhere classified, thoracic region, initial encounter for fracture: Secondary | ICD-10-CM | POA: Insufficient documentation

## 2018-01-27 DIAGNOSIS — R0602 Shortness of breath: Secondary | ICD-10-CM

## 2018-01-27 DIAGNOSIS — E049 Nontoxic goiter, unspecified: Secondary | ICD-10-CM

## 2018-01-27 MED ORDER — IOPAMIDOL (ISOVUE-370) INJECTION 76%
100.0000 mL | Freq: Once | INTRAVENOUS | Status: AC | PRN
  Administered 2018-01-27: 100 mL via INTRAVENOUS

## 2018-01-27 MED ORDER — IOPAMIDOL (ISOVUE-370) INJECTION 76%
INTRAVENOUS | Status: AC
  Filled 2018-01-27: qty 100

## 2018-01-27 NOTE — Progress Notes (Signed)
Spoke with Mendel Ryder, at Dr Golden Pop office: negative for PE/ Mendel Ryder told me to send patient home and they will be in touch with the patient.

## 2018-01-27 NOTE — Telephone Encounter (Signed)
Spoke with CT department at [facility]. Took a call report on the pt's CT angio.  IMPRESSION: 1. Negative for acute pulmonary embolus.  Negative visible aorta. 2. Mild cardiomegaly.  Mild pulmonary atelectasis. 3. Chronic thyromegaly. 4. Chronic T4 through T6 mild compression fractures.  Advised them that they could let the pt go due to a negative scan for PE. MR has been made aware that she was negative for PE.

## 2018-01-27 NOTE — Telephone Encounter (Signed)
cxr clear BMET normal  D-dimer high  Plan  - CT Angio chest to figure out her shortness  Of breath - indication - dyspnea, hx ofPE and has SLE  sendng to triage for action 01/27/2018    Recent Labs  Lab 01/26/18 1146  NA 142  K 3.5  CL 106  CO2 30  GLUCOSE 87  BUN 14  CREATININE 0.96  CALCIUM 9.5    D-dimer high   Dg Chest 2 View  Result Date: 01/26/2018 CLINICAL DATA:  Dyspnea.  History of pleural effusion and lupus. EXAM: CHEST - 2 VIEW COMPARISON:  11/05/2016 FINDINGS: The heart size and mediastinal contours are within normal limits. Both lungs are clear. The visualized skeletal structures are unremarkable. IMPRESSION: No active cardiopulmonary disease. Electronically Signed   By: Kerby Moors M.D.   On: 01/26/2018 13:45

## 2018-01-27 NOTE — Telephone Encounter (Signed)
Let patient Jane Chavez kow  1. No PE. No pleural effusion 2. Dyspnea not known - get echo  3. Has enlarged thyroid  - refer endocrine 4. Chronic Tr-T6 fracture since 2017 - can explain upper back pain - talk to PCP [name], MD   Followup  - after echo    Dg Chest 2 View  Result Date: 01/26/2018 CLINICAL DATA:  Dyspnea.  History of pleural effusion and lupus. EXAM: CHEST - 2 VIEW COMPARISON:  11/05/2016 FINDINGS: The heart size and mediastinal contours are within normal limits. Both lungs are clear. The visualized skeletal structures are unremarkable. IMPRESSION: No active cardiopulmonary disease. Electronically Signed   By: Kerby Moors M.D.   On: 01/26/2018 13:45   Ct Angio Chest W/cm &/or Wo Cm  Result Date: 01/27/2018 CLINICAL DATA:  72 year old female with 2-3 weeks of shortness of breath with exertion, chest tightness. Abnormal D-dimer. EXAM: CT ANGIOGRAPHY CHEST WITH CONTRAST TECHNIQUE: Multidetector CT imaging of the chest was performed using the standard protocol during bolus administration of intravenous contrast. Multiplanar CT image reconstructions and MIPs were obtained to evaluate the vascular anatomy. CONTRAST:  124mL ISOVUE-370 IOPAMIDOL (ISOVUE-370) INJECTION 76% COMPARISON:  Chest radiographs 01/26/2018. CTA chest 09/21/2015. thoracic spine MRI 06/25/2017. FINDINGS: Cardiovascular: Good contrast bolus timing in the pulmonary arterial tree. Mild respiratory motion artifact in the costophrenic angles. No focal filling defect identified in the pulmonary arteries to suggest acute pulmonary embolism. Mild cardiomegaly. No pericardial effusion. Negative visible aorta. Mild if any calcified coronary artery atherosclerosis (indeterminate on series 5, image 107). Mediastinum/Nodes: Chronic thyromegaly. Negative mediastinum. No lymphadenopathy. Lungs/Pleura: No significant mass effect on the trachea due to thyromegaly at the thoracic inlet. The major airways are patent. Mild  dependent pulmonary atelectasis. Otherwise negative lungs. No pleural effusion. Upper Abdomen: Negative visible liver, gallbladder, spleen, pancreas, adrenal glands, kidneys, and visible bowel in the upper abdomen (borderline to mild gastric hiatal hernia). Musculoskeletal: Mild superior endplate compression fractures have developed at T4, T5, and T6 since the 2017 comparison blood work chronic on the January MRI comparison. No bony retropulsion. No CT evidence of thoracic spinal stenosis. The other visible osseous structures appear intact. Review of the MIP images confirms the above findings. IMPRESSION: 1. Negative for acute pulmonary embolus.  Negative visible aorta. 2. Mild cardiomegaly.  Mild pulmonary atelectasis. 3. Chronic thyromegaly. 4. Chronic T4 through T6 mild compression fractures. These results will be called to the ordering clinician or representative by the Radiology Department at the imaging location. Electronically Signed   By: Genevie Ann M.D.   On: 01/27/2018 13:46

## 2018-01-27 NOTE — Telephone Encounter (Signed)
Spoke with pt. She is aware of her results. Order has been placed for CT angio. Nothing further was needed.

## 2018-01-27 NOTE — Telephone Encounter (Signed)
Message will be routed to [facility] per triage protocol.

## 2018-01-29 NOTE — Telephone Encounter (Signed)
Called and spoke with pt letting her know the results of the cxr and based on the results, MR wants her to see an endocrinologist as well as have an echo performed to see if it shows the reason for SOB.  Pt expressed understanding. Orders have been placed. Will also route the results to pt's pcp.   Nothing further needed.

## 2018-02-03 ENCOUNTER — Other Ambulatory Visit: Payer: Self-pay

## 2018-02-03 ENCOUNTER — Ambulatory Visit (HOSPITAL_COMMUNITY): Payer: Medicare Other | Attending: Cardiovascular Disease

## 2018-02-03 DIAGNOSIS — R0602 Shortness of breath: Secondary | ICD-10-CM | POA: Insufficient documentation

## 2018-02-03 DIAGNOSIS — J90 Pleural effusion, not elsewhere classified: Secondary | ICD-10-CM | POA: Insufficient documentation

## 2018-02-03 DIAGNOSIS — E785 Hyperlipidemia, unspecified: Secondary | ICD-10-CM | POA: Insufficient documentation

## 2018-02-03 DIAGNOSIS — I5189 Other ill-defined heart diseases: Secondary | ICD-10-CM | POA: Diagnosis not present

## 2018-02-03 DIAGNOSIS — M329 Systemic lupus erythematosus, unspecified: Secondary | ICD-10-CM | POA: Diagnosis not present

## 2018-02-13 ENCOUNTER — Telehealth: Payer: Self-pay | Admitting: Internal Medicine

## 2018-02-13 NOTE — Telephone Encounter (Signed)
No followup noticed. Please give followup next few weeks to discuss echo. Basically echo normal. No evidence of pulm htn but she has stiff heart muscle that can explain some exertional dyspnea. Can see APP also for followup

## 2018-02-13 NOTE — Telephone Encounter (Signed)
Message will be routed to [facility] per triage protocol as this message is not urgent.

## 2018-02-13 NOTE — Telephone Encounter (Signed)
atc pt X2, line rang to fast busy signal.  Wcb.  

## 2018-02-16 ENCOUNTER — Encounter: Payer: Self-pay | Admitting: *Deleted

## 2018-02-16 NOTE — Telephone Encounter (Signed)
Attempted to call pt but no answer. Left message for pt to return call x3  Due to multiple attempts trying to call pt, will send letter to pt's address on file and encounter will be closed.

## 2018-02-24 DIAGNOSIS — Z0000 Encounter for general adult medical examination without abnormal findings: Secondary | ICD-10-CM | POA: Diagnosis not present

## 2018-02-24 DIAGNOSIS — Z79899 Other long term (current) drug therapy: Secondary | ICD-10-CM | POA: Diagnosis not present

## 2018-02-24 DIAGNOSIS — Z23 Encounter for immunization: Secondary | ICD-10-CM | POA: Diagnosis not present

## 2018-03-03 DIAGNOSIS — M150 Primary generalized (osteo)arthritis: Secondary | ICD-10-CM | POA: Diagnosis not present

## 2018-03-03 DIAGNOSIS — R5383 Other fatigue: Secondary | ICD-10-CM | POA: Diagnosis not present

## 2018-03-03 DIAGNOSIS — M810 Age-related osteoporosis without current pathological fracture: Secondary | ICD-10-CM | POA: Diagnosis not present

## 2018-03-03 DIAGNOSIS — Z0000 Encounter for general adult medical examination without abnormal findings: Secondary | ICD-10-CM | POA: Diagnosis not present

## 2018-03-03 DIAGNOSIS — M329 Systemic lupus erythematosus, unspecified: Secondary | ICD-10-CM | POA: Diagnosis not present

## 2018-03-03 DIAGNOSIS — E782 Mixed hyperlipidemia: Secondary | ICD-10-CM | POA: Diagnosis not present

## 2018-03-03 DIAGNOSIS — R609 Edema, unspecified: Secondary | ICD-10-CM | POA: Diagnosis not present

## 2018-03-26 ENCOUNTER — Encounter: Payer: Self-pay | Admitting: Endocrinology

## 2018-04-06 DIAGNOSIS — H35361 Drusen (degenerative) of macula, right eye: Secondary | ICD-10-CM | POA: Diagnosis not present

## 2018-04-06 DIAGNOSIS — H2511 Age-related nuclear cataract, right eye: Secondary | ICD-10-CM | POA: Diagnosis not present

## 2018-04-06 DIAGNOSIS — H25013 Cortical age-related cataract, bilateral: Secondary | ICD-10-CM | POA: Diagnosis not present

## 2018-04-06 DIAGNOSIS — H25011 Cortical age-related cataract, right eye: Secondary | ICD-10-CM | POA: Diagnosis not present

## 2018-04-06 DIAGNOSIS — H2513 Age-related nuclear cataract, bilateral: Secondary | ICD-10-CM | POA: Diagnosis not present

## 2018-04-06 DIAGNOSIS — H40013 Open angle with borderline findings, low risk, bilateral: Secondary | ICD-10-CM | POA: Diagnosis not present

## 2018-04-13 DIAGNOSIS — M1990 Unspecified osteoarthritis, unspecified site: Secondary | ICD-10-CM | POA: Diagnosis not present

## 2018-04-13 DIAGNOSIS — M329 Systemic lupus erythematosus, unspecified: Secondary | ICD-10-CM | POA: Diagnosis not present

## 2018-04-13 DIAGNOSIS — E559 Vitamin D deficiency, unspecified: Secondary | ICD-10-CM | POA: Diagnosis not present

## 2018-04-13 DIAGNOSIS — M25562 Pain in left knee: Secondary | ICD-10-CM | POA: Diagnosis not present

## 2018-04-13 DIAGNOSIS — Z79899 Other long term (current) drug therapy: Secondary | ICD-10-CM | POA: Diagnosis not present

## 2018-04-13 DIAGNOSIS — M8580 Other specified disorders of bone density and structure, unspecified site: Secondary | ICD-10-CM | POA: Diagnosis not present

## 2018-04-20 ENCOUNTER — Encounter: Payer: Self-pay | Admitting: Gastroenterology

## 2018-04-21 DIAGNOSIS — H25811 Combined forms of age-related cataract, right eye: Secondary | ICD-10-CM | POA: Diagnosis not present

## 2018-04-21 DIAGNOSIS — H2511 Age-related nuclear cataract, right eye: Secondary | ICD-10-CM | POA: Diagnosis not present

## 2018-05-13 DIAGNOSIS — H25012 Cortical age-related cataract, left eye: Secondary | ICD-10-CM | POA: Diagnosis not present

## 2018-05-13 DIAGNOSIS — H2512 Age-related nuclear cataract, left eye: Secondary | ICD-10-CM | POA: Diagnosis not present

## 2018-05-19 DIAGNOSIS — H2512 Age-related nuclear cataract, left eye: Secondary | ICD-10-CM | POA: Diagnosis not present

## 2018-05-19 DIAGNOSIS — H25812 Combined forms of age-related cataract, left eye: Secondary | ICD-10-CM | POA: Diagnosis not present

## 2018-06-18 ENCOUNTER — Encounter: Payer: Self-pay | Admitting: Gastroenterology

## 2018-06-18 ENCOUNTER — Ambulatory Visit (INDEPENDENT_AMBULATORY_CARE_PROVIDER_SITE_OTHER): Payer: Medicare Other | Admitting: Gastroenterology

## 2018-06-18 VITALS — BP 122/70 | HR 68 | Ht 59.0 in | Wt 163.0 lb

## 2018-06-18 DIAGNOSIS — K219 Gastro-esophageal reflux disease without esophagitis: Secondary | ICD-10-CM

## 2018-06-18 DIAGNOSIS — R1011 Right upper quadrant pain: Secondary | ICD-10-CM | POA: Diagnosis not present

## 2018-06-18 DIAGNOSIS — R194 Change in bowel habit: Secondary | ICD-10-CM | POA: Diagnosis not present

## 2018-06-18 MED ORDER — GABAPENTIN 300 MG PO CAPS: 300.0000 mg | ORAL_CAPSULE | Freq: Every day | ORAL | 3 refills | Status: DC

## 2018-06-18 MED ORDER — FAMOTIDINE 20 MG PO TABS: 20.0000 mg | ORAL_TABLET | Freq: Every day | ORAL | 3 refills | Status: DC

## 2018-06-18 NOTE — Patient Instructions (Signed)
If you are age 71 or older, your body mass index should be between 23-30. Your Body mass index is 32.92 kg/m. If this is out of the aforementioned range listed, please consider follow up with your Primary Care Provider.  If you are age 51 or younger, your body mass index should be between 19-25. Your Body mass index is 32.92 kg/m. If this is out of the aformentioned range listed, please consider follow up with your Primary Care Provider.   We have sent the following medications to your pharmacy for you to pick up at your convenience: Gabapentin 300mg : Take every night   Pepcid 20mg : Take once or twice a day as needed  Please purchase the following medications over the counter and take as directed: Daily fiber supplement  Thank you for entrusting me with your care and for choosing [facility], Dr. [place]

## 2018-06-18 NOTE — Progress Notes (Signed)
HPI :  73 year old female with a history of lupus, on Imuran and Plaquenil and prednisone, here for a follow-up visit for abdominal pain.  She's had a workup for this including US of the abdomen in 09-05-2014 showing no gallstones. CT scan in December 2018 showed a suspected gastric lipoma but no cause for pain. 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   In general she's been doing okay since I've last seen her. She continues to have some abdominal discomfort. Previously the discomfort was located in the generalized upper abdomen, now it is mostly located in the right upper quadrant underneath her rib cage. Pain is the same as described the last time. Ongoing for a few years now.  She reports it as a pulling sensation in her RUQ that comes and goes. It lasts 5-10 minutes at a time, multiple times per day, no clear triggers. She denies anything that makes it better or worse. More recently it does seem somewhat positional. She does not have any improvement or worsening with eating. She denies any nausea or vomiting. She denies any weight loss. She does endorse some occasional reflux symptoms and heartburn, she has not been taking anything for it. She otherwise has had some change in her stool caliber, passing small hard piece of stool, feels somewhat constipated. No bleeding symptoms.    Prior workup: Colonoscopy 04/03/2017 - diverticulosis, 2 small polyps removed - one adenoma, recall 5 years US abdomen 12/06/2014 - normal gallbladder, no gallstones CT scan 06/06/2017 - suspected gastric lipoma, otherwise unremarkable exam EGD 09/09/2017 - 3cm HH, large lipoma of the antrum, biopsies negative for HP  Past Medical History:  Diagnosis Date  . Back stiffness 03/13/2017   Pt.'s Dr. thinks it is Lupus related.  . Diverticulosis   . GERD (gastroesophageal reflux disease)   . History of colon polyps   . HLD (hyperlipidemia)   . Lupus ([place])   .  Pleural effusion 09/2015     Past Surgical History:  Procedure Laterality Date  . BREAST LUMPECTOMY Right   . CATARACT EXTRACTION, BILATERAL    . ESOPHAGOGASTRODUODENOSCOPY N/A 09/09/2017   Procedure: ESOPHAGOGASTRODUODENOSCOPY (EGD);  Surgeon: Yetta Flock, MD;  Location: [facility];  Service: Gastroenterology;  Laterality: N/A;  . REPLACEMENT TOTAL KNEE Right    Family History  Problem Relation Age of Onset  . Colon cancer Sister   . Other Brother        colon surgery, growth removed  . Colon cancer Other        niece, deceased 09/04/16  . Asthma Mother    Social History   Tobacco Use  . Smoking status: Never Smoker  . Smokeless tobacco: Never Used  Substance Use Topics  . Alcohol use: No  . Drug use: No   Current Outpatient Medications  Medication Sig Dispense Refill  . azaTHIOprine (IMURAN) 50 MG tablet Take 100 mg by mouth daily  2  . Calcium Carb-Cholecalciferol (CALCIUM 500 +D) 500-400 MG-UNIT TABS Take 1 tablet by mouth daily.     . carboxymethylcellul-glycerin (REFRESH OPTIVE) 0.5-0.9 % ophthalmic solution Place 1 drop into both eyes 2 (two) times daily as needed for dry eyes.    . furosemide (LASIX) 80 MG tablet Take 80 mg by mouth daily.   0  . hydroxychloroquine (PLAQUENIL) 200 MG tablet Take 1 tablet (200 mg total) by mouth daily. (Patient taking differently: Take 400 mg by mouth daily. )  30 tablet 0  . potassium chloride SA (K-DUR,KLOR-CON) 20 MEQ tablet Take 20 mEq by mouth daily.  0  . predniSONE (DELTASONE) 5 MG tablet Take 7.5 mg by mouth daily with breakfast.     No current facility-administered medications for this visit.    Allergies  Allergen Reactions  . Penicillins Shortness Of Breath    Has patient had a PCN reaction causing immediate rash, facial/tongue/throat swelling, SOB or lightheadedness with hypotension: Yes Has patient had a PCN reaction causing severe rash involving mucus membranes or skin necrosis: No Has patient had a PCN  reaction that required hospitalization No Has patient had a PCN reaction occurring within the last 10 years: No If all of the above answers are "NO", then may proceed with Cephalosporin use.      Review of Systems: All systems reviewed and negative except where noted in HPI.   Lab Results  Component Value Date   WBC 10.3 10/04/2015   HGB 12.6 10/04/2015   HCT 38.8 10/04/2015   MCV 86.8 10/04/2015   PLT 128 (L) 10/04/2015    Lab Results  Component Value Date   CREATININE 0.96 01/26/2018   BUN 14 01/26/2018   NA 142 01/26/2018   K 3.5 01/26/2018   CL 106 01/26/2018   CO2 30 01/26/2018    Lab Results  Component Value Date   ALT 15 10/04/2015   AST 18 10/04/2015   ALKPHOS 32 (L) 10/04/2015   BILITOT 0.3 10/04/2015     Physical Exam: BP 122/70   Pulse 68   Ht 4\' 11"  (1.499 m)   Wt 163 lb (73.9 kg)   BMI 32.92 kg/m  Constitutional: Pleasant,well-developed, female in no acute distress. HEENT: Normocephalic and atraumatic. Conjunctivae are normal. No scleral icterus. Neck supple.  Cardiovascular: Normal rate, regular rhythm.  Pulmonary/chest: Effort normal and breath sounds normal. No wheezing, rales or rhonchi. Abdominal: Soft, nondistended, RUQ TTP, positive Carnett . There are no masses palpable. No hepatomegaly. Extremities: no edema Lymphadenopathy: No cervical adenopathy noted. Neurological: Alert and oriented to person place and time. Skin: Skin is warm and dry. No rashes noted. Psychiatric: Normal mood and affect. Behavior is normal.   ASSESSMENT AND PLAN: 73 year old female here for reassessment of the following issues:  Abdominal pain - ongoing for a few years now, appears to have migrated mostly the right upper quadrant. Prior workup with ultrasound, CT scan, endoscopy unrevealing. Today could somewhat reproduce her symptoms with palpation. I think she probably has musculoskeletal or neuropathic abdominal wall pain. I discussed options with her. We'll  try her on a regimen of gabapentin 300 mg daily at bedtime to see if that helps at all. If no improvement after a few weeks asked her to call me back for reassessment. Otherwise reassured her I do not see any concerning pathology. I do not think the gastric lipoma is causing this symptom.  GERD - recommend a trial of some Pepcid 20 mg once to twice daily as needed given the symptoms are bothering her more recently. She agreed with the plan.  Change in stool form - she complained about this about a year ago, had recommended a fiber supplement which she has not yet tried. Again recommend daily fiber supplement as I think it will help with this. Recent colonoscopy reassuring.  Lonerock Cellar, MD [facility]

## 2018-07-13 DIAGNOSIS — M1990 Unspecified osteoarthritis, unspecified site: Secondary | ICD-10-CM | POA: Diagnosis not present

## 2018-07-13 DIAGNOSIS — M329 Systemic lupus erythematosus, unspecified: Secondary | ICD-10-CM | POA: Diagnosis not present

## 2018-07-13 DIAGNOSIS — M8580 Other specified disorders of bone density and structure, unspecified site: Secondary | ICD-10-CM | POA: Diagnosis not present

## 2018-07-13 DIAGNOSIS — E559 Vitamin D deficiency, unspecified: Secondary | ICD-10-CM | POA: Diagnosis not present

## 2018-07-13 DIAGNOSIS — M25562 Pain in left knee: Secondary | ICD-10-CM | POA: Diagnosis not present

## 2018-07-13 DIAGNOSIS — Z79899 Other long term (current) drug therapy: Secondary | ICD-10-CM | POA: Diagnosis not present

## 2018-08-25 DIAGNOSIS — Z79899 Other long term (current) drug therapy: Secondary | ICD-10-CM | POA: Diagnosis not present

## 2018-08-25 DIAGNOSIS — R5383 Other fatigue: Secondary | ICD-10-CM | POA: Diagnosis not present

## 2018-08-25 DIAGNOSIS — E559 Vitamin D deficiency, unspecified: Secondary | ICD-10-CM | POA: Diagnosis not present

## 2018-08-25 DIAGNOSIS — E782 Mixed hyperlipidemia: Secondary | ICD-10-CM | POA: Diagnosis not present

## 2018-09-07 DIAGNOSIS — B0223 Postherpetic polyneuropathy: Secondary | ICD-10-CM | POA: Diagnosis not present

## 2018-09-07 DIAGNOSIS — L299 Pruritus, unspecified: Secondary | ICD-10-CM | POA: Diagnosis not present

## 2018-10-11 ENCOUNTER — Other Ambulatory Visit: Payer: Self-pay | Admitting: Gastroenterology

## 2018-10-12 DIAGNOSIS — M1990 Unspecified osteoarthritis, unspecified site: Secondary | ICD-10-CM | POA: Diagnosis not present

## 2018-10-12 DIAGNOSIS — M8580 Other specified disorders of bone density and structure, unspecified site: Secondary | ICD-10-CM | POA: Diagnosis not present

## 2018-10-12 DIAGNOSIS — M329 Systemic lupus erythematosus, unspecified: Secondary | ICD-10-CM | POA: Diagnosis not present

## 2018-10-12 DIAGNOSIS — M25562 Pain in left knee: Secondary | ICD-10-CM | POA: Diagnosis not present

## 2018-10-12 DIAGNOSIS — E559 Vitamin D deficiency, unspecified: Secondary | ICD-10-CM | POA: Diagnosis not present

## 2018-10-12 DIAGNOSIS — Z79899 Other long term (current) drug therapy: Secondary | ICD-10-CM | POA: Diagnosis not present

## 2018-12-24 DIAGNOSIS — Z961 Presence of intraocular lens: Secondary | ICD-10-CM | POA: Diagnosis not present

## 2018-12-24 DIAGNOSIS — Z79899 Other long term (current) drug therapy: Secondary | ICD-10-CM | POA: Diagnosis not present

## 2018-12-24 DIAGNOSIS — H40013 Open angle with borderline findings, low risk, bilateral: Secondary | ICD-10-CM | POA: Diagnosis not present

## 2018-12-24 DIAGNOSIS — H04123 Dry eye syndrome of bilateral lacrimal glands: Secondary | ICD-10-CM | POA: Diagnosis not present

## 2019-01-07 ENCOUNTER — Other Ambulatory Visit: Payer: Self-pay

## 2019-01-13 DIAGNOSIS — R079 Chest pain, unspecified: Secondary | ICD-10-CM | POA: Diagnosis not present

## 2019-01-13 DIAGNOSIS — M25562 Pain in left knee: Secondary | ICD-10-CM | POA: Diagnosis not present

## 2019-01-13 DIAGNOSIS — Z79899 Other long term (current) drug therapy: Secondary | ICD-10-CM | POA: Diagnosis not present

## 2019-01-13 DIAGNOSIS — M8580 Other specified disorders of bone density and structure, unspecified site: Secondary | ICD-10-CM | POA: Diagnosis not present

## 2019-01-13 DIAGNOSIS — E559 Vitamin D deficiency, unspecified: Secondary | ICD-10-CM | POA: Diagnosis not present

## 2019-01-13 DIAGNOSIS — M1990 Unspecified osteoarthritis, unspecified site: Secondary | ICD-10-CM | POA: Diagnosis not present

## 2019-01-13 DIAGNOSIS — M329 Systemic lupus erythematosus, unspecified: Secondary | ICD-10-CM | POA: Diagnosis not present

## 2019-01-27 DIAGNOSIS — R0989 Other specified symptoms and signs involving the circulatory and respiratory systems: Secondary | ICD-10-CM | POA: Diagnosis not present

## 2019-02-26 DIAGNOSIS — Z0000 Encounter for general adult medical examination without abnormal findings: Secondary | ICD-10-CM | POA: Diagnosis not present

## 2019-02-26 DIAGNOSIS — R5383 Other fatigue: Secondary | ICD-10-CM | POA: Diagnosis not present

## 2019-02-26 DIAGNOSIS — E559 Vitamin D deficiency, unspecified: Secondary | ICD-10-CM | POA: Diagnosis not present

## 2019-02-26 DIAGNOSIS — E782 Mixed hyperlipidemia: Secondary | ICD-10-CM | POA: Diagnosis not present

## 2019-03-04 DIAGNOSIS — M329 Systemic lupus erythematosus, unspecified: Secondary | ICD-10-CM | POA: Diagnosis not present

## 2019-03-04 DIAGNOSIS — E041 Nontoxic single thyroid nodule: Secondary | ICD-10-CM | POA: Diagnosis not present

## 2019-03-04 DIAGNOSIS — M150 Primary generalized (osteo)arthritis: Secondary | ICD-10-CM | POA: Diagnosis not present

## 2019-03-04 DIAGNOSIS — Z0000 Encounter for general adult medical examination without abnormal findings: Secondary | ICD-10-CM | POA: Diagnosis not present

## 2019-03-04 DIAGNOSIS — E559 Vitamin D deficiency, unspecified: Secondary | ICD-10-CM | POA: Diagnosis not present

## 2019-03-04 DIAGNOSIS — Z23 Encounter for immunization: Secondary | ICD-10-CM | POA: Diagnosis not present

## 2019-03-04 DIAGNOSIS — M810 Age-related osteoporosis without current pathological fracture: Secondary | ICD-10-CM | POA: Diagnosis not present

## 2019-03-04 DIAGNOSIS — R5383 Other fatigue: Secondary | ICD-10-CM | POA: Diagnosis not present

## 2019-03-04 DIAGNOSIS — E782 Mixed hyperlipidemia: Secondary | ICD-10-CM | POA: Diagnosis not present

## 2019-04-16 DIAGNOSIS — Z79899 Other long term (current) drug therapy: Secondary | ICD-10-CM | POA: Diagnosis not present

## 2019-04-16 DIAGNOSIS — M329 Systemic lupus erythematosus, unspecified: Secondary | ICD-10-CM | POA: Diagnosis not present

## 2019-04-16 DIAGNOSIS — M1990 Unspecified osteoarthritis, unspecified site: Secondary | ICD-10-CM | POA: Diagnosis not present

## 2019-04-16 DIAGNOSIS — M25562 Pain in left knee: Secondary | ICD-10-CM | POA: Diagnosis not present

## 2019-04-16 DIAGNOSIS — M8580 Other specified disorders of bone density and structure, unspecified site: Secondary | ICD-10-CM | POA: Diagnosis not present

## 2019-04-16 DIAGNOSIS — E559 Vitamin D deficiency, unspecified: Secondary | ICD-10-CM | POA: Diagnosis not present

## 2019-05-21 DIAGNOSIS — Z79899 Other long term (current) drug therapy: Secondary | ICD-10-CM | POA: Diagnosis not present

## 2019-05-21 DIAGNOSIS — M329 Systemic lupus erythematosus, unspecified: Secondary | ICD-10-CM | POA: Diagnosis not present

## 2019-07-16 DIAGNOSIS — Z20828 Contact with and (suspected) exposure to other viral communicable diseases: Secondary | ICD-10-CM | POA: Diagnosis not present

## 2019-08-06 ENCOUNTER — Other Ambulatory Visit: Payer: Self-pay

## 2019-08-06 ENCOUNTER — Ambulatory Visit: Payer: Medicare Other | Attending: Internal Medicine

## 2019-08-06 DIAGNOSIS — Z23 Encounter for immunization: Secondary | ICD-10-CM | POA: Insufficient documentation

## 2019-08-06 NOTE — Progress Notes (Signed)
   Covid-19 Vaccination Clinic  Name:  Justyn Leupold    MRN: [id number] DOB: 03/26/1946  08/06/2019  Ms. Mwangi was observed post Covid-19 immunization for 30 minutes based on pre-vaccination screening without incidence. She was provided with Vaccine Information Sheet and instruction to access the V-Safe system.   Ms. Brugger was instructed to call 911 with any severe reactions post vaccine: Marland Kitchen Difficulty breathing  . Swelling of your face and throat  . A fast heartbeat  . A bad rash all over your body  . Dizziness and weakness    Immunizations Administered    Name Date Dose VIS Date Route   Pfizer COVID-19 Vaccine 08/06/2019  4:22 PM 0.3 mL 05/21/2019 Intramuscular   Manufacturer: [place]   Lot: [id number]   [place]: [id number]

## 2019-08-17 DIAGNOSIS — M25562 Pain in left knee: Secondary | ICD-10-CM | POA: Diagnosis not present

## 2019-08-17 DIAGNOSIS — M329 Systemic lupus erythematosus, unspecified: Secondary | ICD-10-CM | POA: Diagnosis not present

## 2019-08-17 DIAGNOSIS — M1990 Unspecified osteoarthritis, unspecified site: Secondary | ICD-10-CM | POA: Diagnosis not present

## 2019-08-17 DIAGNOSIS — M8580 Other specified disorders of bone density and structure, unspecified site: Secondary | ICD-10-CM | POA: Diagnosis not present

## 2019-08-17 DIAGNOSIS — M7582 Other shoulder lesions, left shoulder: Secondary | ICD-10-CM | POA: Diagnosis not present

## 2019-08-17 DIAGNOSIS — Z79899 Other long term (current) drug therapy: Secondary | ICD-10-CM | POA: Diagnosis not present

## 2019-08-17 DIAGNOSIS — M7550 Bursitis of unspecified shoulder: Secondary | ICD-10-CM | POA: Diagnosis not present

## 2019-08-17 DIAGNOSIS — D849 Immunodeficiency, unspecified: Secondary | ICD-10-CM | POA: Diagnosis not present

## 2019-08-17 DIAGNOSIS — E559 Vitamin D deficiency, unspecified: Secondary | ICD-10-CM | POA: Diagnosis not present

## 2019-08-26 DIAGNOSIS — E559 Vitamin D deficiency, unspecified: Secondary | ICD-10-CM | POA: Diagnosis not present

## 2019-08-26 DIAGNOSIS — M329 Systemic lupus erythematosus, unspecified: Secondary | ICD-10-CM | POA: Diagnosis not present

## 2019-08-26 DIAGNOSIS — Z79899 Other long term (current) drug therapy: Secondary | ICD-10-CM | POA: Diagnosis not present

## 2019-08-26 DIAGNOSIS — E041 Nontoxic single thyroid nodule: Secondary | ICD-10-CM | POA: Diagnosis not present

## 2019-08-26 DIAGNOSIS — E782 Mixed hyperlipidemia: Secondary | ICD-10-CM | POA: Diagnosis not present

## 2019-08-26 DIAGNOSIS — Z13220 Encounter for screening for lipoid disorders: Secondary | ICD-10-CM | POA: Diagnosis not present

## 2019-08-26 DIAGNOSIS — E8809 Other disorders of plasma-protein metabolism, not elsewhere classified: Secondary | ICD-10-CM | POA: Diagnosis not present

## 2019-08-26 DIAGNOSIS — R5382 Chronic fatigue, unspecified: Secondary | ICD-10-CM | POA: Diagnosis not present

## 2019-09-01 ENCOUNTER — Ambulatory Visit: Payer: Medicare Other | Attending: Internal Medicine

## 2019-09-01 DIAGNOSIS — Z23 Encounter for immunization: Secondary | ICD-10-CM

## 2019-09-01 NOTE — Progress Notes (Signed)
   Covid-19 Vaccination Clinic  Name:  Drema Staudt    MRN: [id number] DOB: 1945/11/02  09/01/2019  Ms. Nolet was observed post Covid-19 immunization for 30 minutes based on pre-vaccination screening without incident. She was provided with Vaccine Information Sheet and instruction to access the V-Safe system.   Ms. Bunce was instructed to call 911 with any severe reactions post vaccine: Marland Kitchen Difficulty breathing  . Swelling of face and throat  . A fast heartbeat  . A bad rash all over body  . Dizziness and weakness   Immunizations Administered    Name Date Dose VIS Date Route   Pfizer COVID-19 Vaccine 09/01/2019  9:02 AM 0.3 mL 05/21/2019 Intramuscular   Manufacturer: [place]   Lot: [id number]   [place]: [id number]

## 2019-09-02 DIAGNOSIS — E782 Mixed hyperlipidemia: Secondary | ICD-10-CM | POA: Diagnosis not present

## 2019-09-02 DIAGNOSIS — D849 Immunodeficiency, unspecified: Secondary | ICD-10-CM | POA: Diagnosis not present

## 2019-09-02 DIAGNOSIS — R5382 Chronic fatigue, unspecified: Secondary | ICD-10-CM | POA: Diagnosis not present

## 2019-09-02 DIAGNOSIS — M810 Age-related osteoporosis without current pathological fracture: Secondary | ICD-10-CM | POA: Diagnosis not present

## 2019-09-02 DIAGNOSIS — E559 Vitamin D deficiency, unspecified: Secondary | ICD-10-CM | POA: Diagnosis not present

## 2019-09-02 DIAGNOSIS — M150 Primary generalized (osteo)arthritis: Secondary | ICD-10-CM | POA: Diagnosis not present

## 2019-09-02 DIAGNOSIS — E041 Nontoxic single thyroid nodule: Secondary | ICD-10-CM | POA: Diagnosis not present

## 2019-09-02 DIAGNOSIS — M329 Systemic lupus erythematosus, unspecified: Secondary | ICD-10-CM | POA: Diagnosis not present

## 2019-09-16 ENCOUNTER — Telehealth: Payer: Self-pay | Admitting: Internal Medicine

## 2019-09-16 ENCOUNTER — Ambulatory Visit (INDEPENDENT_AMBULATORY_CARE_PROVIDER_SITE_OTHER): Payer: Medicare Other

## 2019-09-16 DIAGNOSIS — Z8709 Personal history of other diseases of the respiratory system: Secondary | ICD-10-CM | POA: Diagnosis not present

## 2019-09-16 DIAGNOSIS — Z0389 Encounter for observation for other suspected diseases and conditions ruled out: Secondary | ICD-10-CM | POA: Diagnosis not present

## 2019-09-16 NOTE — Telephone Encounter (Signed)
Called spoke with patient. Patient states she had chest and back tightness last night with some wheezing, this morning just back tightness . Patient states she has had fluid in her lungs before and it feels like that. I spoke with Aaron Edelman APP of the day in office and he stated to have patient come in for an xray and we can decide from the results of that xray.   Order placed stat Nothing further needed at this time.

## 2019-09-17 ENCOUNTER — Telehealth: Payer: Self-pay | Admitting: Internal Medicine

## 2019-09-17 NOTE — Telephone Encounter (Signed)
Lauraine Rinne, NP  09/16/2019 11:45 AM EDT    Chest Xray clear. No effusion. If symptoms persist seek emergent care. Keep follow up with Dr. Chase Caller.   Triage,  Please call the patient regarding these results.   [place] and spoke with pt letting her know the results of the cxr and she verbalized understanding. Nothing further needed.

## 2019-09-30 ENCOUNTER — Encounter: Payer: Self-pay | Admitting: Gastroenterology

## 2019-09-30 ENCOUNTER — Ambulatory Visit (INDEPENDENT_AMBULATORY_CARE_PROVIDER_SITE_OTHER): Payer: Medicare Other | Admitting: Gastroenterology

## 2019-09-30 VITALS — BP 110/70 | HR 64 | Temp 97.8°F | Ht 62.0 in | Wt 163.4 lb

## 2019-09-30 DIAGNOSIS — R1011 Right upper quadrant pain: Secondary | ICD-10-CM

## 2019-09-30 DIAGNOSIS — K219 Gastro-esophageal reflux disease without esophagitis: Secondary | ICD-10-CM

## 2019-09-30 MED ORDER — FAMOTIDINE 20 MG PO TABS: 20.0000 mg | ORAL_TABLET | Freq: Every day | ORAL | 3 refills | Status: DC

## 2019-09-30 NOTE — Patient Instructions (Addendum)
You have been scheduled for an abdominal ultrasound at [facility] (1st floor of hospital) on 10/06/19 at 8:30am. Please arrive 15 minutes prior to your appointment for registration. Make certain not to have anything to eat or drink 6 hours prior to your appointment. Should you need to reschedule your appointment, please contact radiology at [phone]. This test typically takes about 30 minutes to perform.  We have sent the following medications to your pharmacy for you to pick up at your convenience: Pepcid.    Normal BMI (Body Mass Index- based on height and weight) is between 23 and 30. Your BMI today is Body mass index is 29.88 kg/m. Marland Kitchen Please consider follow up  regarding your BMI with your Primary Care Provider.  Thank you for entrusting me with your care and for choosing [facility], Dr. Falman Cellar

## 2019-09-30 NOTE — Progress Notes (Signed)
HPI :  74 year old female with a history of lupus,on Imuran and Plaquenilandprednisone,here for a follow-up visit for abdominal pain.  I have not seen her since January 2020.  At that time we discussed her right upper quadrant pain, we discussed management of reflux.  She has had right upper quadrant discomfort ongoing for a few years now at least.  She states she thinks it is changed a little bit since of last seen her.  She states in the past it was right mid flank or so however states it is now right upper quadrant underneath the rib cage.  On review of my notes in 07-Sep-2018 it appears she also complained that the pain was underneath the right upper quadrant/rib cage area.  She states it feels like a pressure.  It can last minutes to an hour.  She will feel it every day, perhaps a few times a day and then it goes away on its own.  Sometimes she can feel it with eating, sometimes not, sometimes positional changes make this worse.  The last time I saw her she stated it was much shorter in duration, perhaps 5 to 10 minutes, sounds like it is lasting a bit longer at this point.  She states more than half the time she does not think it is related to eating.  She denies any weight loss.  She has been taking Pepcid for reflux which works pretty well.  She denies any NSAIDs.  I had given her a trial of gabapentin for possible musculoskeletal pain and she states it caused poor sleep.  Most recently she had labs with her primary care which she brought with her today, drawn 08/25/2019 -white blood cell count of 7.6, hemoglobin 13.7, platelets 147.  LFTs and renal function normal.  Her labs from December 2020 also showed normal hemoglobin and white blood cell count.  She's had a workup for this including US of the abdomen in 2014-09-07 showing no gallstones. CT scan in December 2018 showed a suspected gastric lipoma but no cause for pain. She had a subsequent EGD in April of 2019 showing large gastric lipoma  (nonobstructive) with a 3cm HH.   Prior workup: Colonoscopy 04/03/2017 - diverticulosis, 2 small polyps removed - one adenoma, recall 5 years US abdomen6/28/2016- normal gallbladder, no gallstones CT scan 06/06/2017 - suspected gastric lipoma, otherwise unremarkable exam EGD 09/09/2017 - 3cm HH, large lipoma of the antrum, biopsies negative for HP   Past Medical History:  Diagnosis Date  . Back stiffness 03/13/2017   Pt.'s Dr. thinks it is Lupus related.  . Diverticulosis   . GERD (gastroesophageal reflux disease)   . History of colon polyps   . HLD (hyperlipidemia)   . Lupus ([place])   . Pleural effusion 09/2015     Past Surgical History:  Procedure Laterality Date  . BREAST LUMPECTOMY Right   . CATARACT EXTRACTION, BILATERAL    . ESOPHAGOGASTRODUODENOSCOPY N/A 09/09/2017   Procedure: ESOPHAGOGASTRODUODENOSCOPY (EGD);  Surgeon: Yetta Flock, MD;  Location: [facility];  Service: Gastroenterology;  Laterality: N/A;  . REPLACEMENT TOTAL KNEE Right    Family History  Problem Relation Age of Onset  . Colon cancer Sister   . Other Brother        colon surgery, growth removed  . Colon cancer Other        niece, deceased 06-Sep-2016  . Asthma Mother    Social History   Tobacco Use  . Smoking status: Never Smoker  . Smokeless  tobacco: Never Used  Substance Use Topics  . Alcohol use: No  . Drug use: No   Current Outpatient Medications  Medication Sig Dispense Refill  . azaTHIOprine (IMURAN) 50 MG tablet Take 100 mg by mouth daily  2  . Calcium Carb-Cholecalciferol (CALCIUM 500 +D) 500-400 MG-UNIT TABS Take 1 tablet by mouth daily.     . carboxymethylcellul-glycerin (REFRESH OPTIVE) 0.5-0.9 % ophthalmic solution Place 1 drop into both eyes 2 (two) times daily as needed for dry eyes.    . famotidine (PEPCID) 20 MG tablet TAKE 1-2 TABLETS (20-40 MG TOTAL) BY MOUTH DAILY. 60 tablet 3  . furosemide (LASIX) 80 MG tablet Take 80 mg by mouth daily.   0  . hydroxychloroquine  (PLAQUENIL) 200 MG tablet Take 1 tablet (200 mg total) by mouth daily. (Patient taking differently: Take 400 mg by mouth daily. ) 30 tablet 0  . potassium chloride SA (K-DUR,KLOR-CON) 20 MEQ tablet Take 20 mEq by mouth daily.  0  . predniSONE (DELTASONE) 5 MG tablet Take 7.5 mg by mouth daily with breakfast.     No current facility-administered medications for this visit.   Allergies  Allergen Reactions  . Penicillins Shortness Of Breath    Has patient had a PCN reaction causing immediate rash, facial/tongue/throat swelling, SOB or lightheadedness with hypotension: Yes Has patient had a PCN reaction causing severe rash involving mucus membranes or skin necrosis: No Has patient had a PCN reaction that required hospitalization No Has patient had a PCN reaction occurring within the last 10 years: No If all of the above answers are "NO", then may proceed with Cephalosporin use.      Review of Systems: All systems reviewed and negative except where noted in HPI.    DG Chest 2 View  Result Date: 09/16/2019 CLINICAL DATA:  Possible pleural effusion EXAM: CHEST - 2 VIEW COMPARISON:  01/13/2019 FINDINGS: The heart size and mediastinal contours are within normal limits. Both lungs are clear. Disc degenerative disease of the thoracic spine. IMPRESSION: No acute abnormality of the lungs. Electronically Signed   By: Eddie Candle M.D.   On: 09/16/2019 11:38    Physical Exam: BP 110/70 (BP Location: Left Arm, Patient Position: Sitting, Cuff Size: Large)   Pulse 64   Temp 97.8 F (36.6 C)   Ht 5\' 2"  (1.575 m)   Wt 163 lb 6 oz (74.1 kg)   SpO2 100%   BMI 29.88 kg/m  Constitutional: Pleasant,well-developed, female in no acute distress.. Abdominal: Soft, nondistended, nontender. Could not reproduce her pain, negative Carnett sign. There are no masses palpable.  Extremities: no edema Lymphadenopathy: No cervical adenopathy noted. Neurological: Alert and oriented to person place and time. Skin:  Skin is warm and dry. No rashes noted. Psychiatric: Normal mood and affect. Behavior is normal.   ASSESSMENT AND PLAN: 74 year old female here for reassessment the following:  Abdominal pain - ongoing for a few years now, described as above in the right upper quadrant.  Patient states it is lasting longer than previous and perhaps more frequently, perhaps with more severity.  She has both a positional component to this, and occasionally postprandial although that is not as common.  Given her prior work-up I do think this is probably musculoskeletal /neuropathic type of pain, that being said I could not elicit her symptoms on exam today.  I think biliary colic is less likely given prior US findings but she is not been imaged in a few years.  With the change in  her symptoms I will obtain a right upper quadrant ultrasound to re-screen for gallstones and see if anything is changed in the soft tissues in this area.  We will see what this shows and discussed options moving forward.  If this is thought to be musculoskeletal we can consider other modalities to treat her pain.  May also consider CT scan if this continues to worsen despite conservative measures.  We discussed her gastric lipoma.  This was a few centimeters in size when last evaluated.  I think this would be less likely the cause for her symptoms based on how she describes it today.  We discussed that removal this would entail surgery, and I think that would be unlikely to help her at this time.  She agreed with the plan, will await the ultrasound and go from there.  GERD - continue pepcid PRN, working well.  [place] Cellar, MD [facility]

## 2019-10-06 ENCOUNTER — Ambulatory Visit (HOSPITAL_COMMUNITY)
Admission: RE | Admit: 2019-10-06 | Discharge: 2019-10-06 | Disposition: A | Discharge status: Home / Self Care | Payer: Medicare Other | Source: Ambulatory Visit | Attending: Gastroenterology | Admitting: Gastroenterology

## 2019-10-06 ENCOUNTER — Other Ambulatory Visit: Payer: Self-pay

## 2019-10-06 DIAGNOSIS — R1011 Right upper quadrant pain: Secondary | ICD-10-CM | POA: Insufficient documentation

## 2019-10-07 ENCOUNTER — Ambulatory Visit: Payer: Medicare Other | Admitting: Internal Medicine

## 2019-10-11 ENCOUNTER — Telehealth: Payer: Self-pay | Admitting: Gastroenterology

## 2019-10-11 NOTE — Telephone Encounter (Signed)
I reviewed the results with her.  She wants to come in for an office visit to discuss options.  She is going out or town an will call for an office visit when she returns.

## 2019-12-24 ENCOUNTER — Other Ambulatory Visit: Payer: Self-pay | Admitting: Gastroenterology

## 2019-12-24 ENCOUNTER — Other Ambulatory Visit: Payer: Self-pay

## 2019-12-28 DIAGNOSIS — R600 Localized edema: Secondary | ICD-10-CM | POA: Diagnosis not present

## 2019-12-28 DIAGNOSIS — M25462 Effusion, left knee: Secondary | ICD-10-CM | POA: Diagnosis not present

## 2019-12-28 DIAGNOSIS — M25562 Pain in left knee: Secondary | ICD-10-CM | POA: Diagnosis not present

## 2019-12-28 DIAGNOSIS — Z79899 Other long term (current) drug therapy: Secondary | ICD-10-CM | POA: Diagnosis not present

## 2019-12-28 DIAGNOSIS — M1712 Unilateral primary osteoarthritis, left knee: Secondary | ICD-10-CM | POA: Diagnosis not present

## 2019-12-28 DIAGNOSIS — M329 Systemic lupus erythematosus, unspecified: Secondary | ICD-10-CM | POA: Diagnosis not present

## 2019-12-28 DIAGNOSIS — M8580 Other specified disorders of bone density and structure, unspecified site: Secondary | ICD-10-CM | POA: Diagnosis not present

## 2019-12-28 DIAGNOSIS — E559 Vitamin D deficiency, unspecified: Secondary | ICD-10-CM | POA: Diagnosis not present

## 2019-12-28 DIAGNOSIS — M1990 Unspecified osteoarthritis, unspecified site: Secondary | ICD-10-CM | POA: Diagnosis not present

## 2020-02-16 DIAGNOSIS — M1712 Unilateral primary osteoarthritis, left knee: Secondary | ICD-10-CM | POA: Diagnosis not present

## 2020-02-16 DIAGNOSIS — M25562 Pain in left knee: Secondary | ICD-10-CM | POA: Diagnosis not present

## 2020-02-25 DIAGNOSIS — M329 Systemic lupus erythematosus, unspecified: Secondary | ICD-10-CM | POA: Diagnosis not present

## 2020-03-03 DIAGNOSIS — Z780 Asymptomatic menopausal state: Secondary | ICD-10-CM | POA: Diagnosis not present

## 2020-03-03 DIAGNOSIS — R5383 Other fatigue: Secondary | ICD-10-CM | POA: Diagnosis not present

## 2020-03-03 DIAGNOSIS — Z23 Encounter for immunization: Secondary | ICD-10-CM | POA: Diagnosis not present

## 2020-03-03 DIAGNOSIS — E041 Nontoxic single thyroid nodule: Secondary | ICD-10-CM | POA: Diagnosis not present

## 2020-03-03 DIAGNOSIS — M329 Systemic lupus erythematosus, unspecified: Secondary | ICD-10-CM | POA: Diagnosis not present

## 2020-03-03 DIAGNOSIS — E782 Mixed hyperlipidemia: Secondary | ICD-10-CM | POA: Diagnosis not present

## 2020-03-03 DIAGNOSIS — M150 Primary generalized (osteo)arthritis: Secondary | ICD-10-CM | POA: Diagnosis not present

## 2020-03-03 DIAGNOSIS — R5382 Chronic fatigue, unspecified: Secondary | ICD-10-CM | POA: Diagnosis not present

## 2020-03-03 DIAGNOSIS — D849 Immunodeficiency, unspecified: Secondary | ICD-10-CM | POA: Diagnosis not present

## 2020-03-03 DIAGNOSIS — M810 Age-related osteoporosis without current pathological fracture: Secondary | ICD-10-CM | POA: Diagnosis not present

## 2020-03-03 DIAGNOSIS — Z01812 Encounter for preprocedural laboratory examination: Secondary | ICD-10-CM | POA: Diagnosis not present

## 2020-03-03 DIAGNOSIS — E559 Vitamin D deficiency, unspecified: Secondary | ICD-10-CM | POA: Diagnosis not present

## 2020-04-01 DIAGNOSIS — Z23 Encounter for immunization: Secondary | ICD-10-CM | POA: Diagnosis not present

## 2020-04-17 NOTE — Patient Instructions (Addendum)
DUE TO COVID-19 ONLY ONE VISITOR IS ALLOWED TO COME WITH YOU AND STAY IN THE WAITING ROOM ONLY DURING PRE OP AND PROCEDURE DAY OF SURGERY. THE 1 VISITOR  MAY VISIT WITH YOU AFTER SURGERY IN YOUR PRIVATE ROOM DURING VISITING HOURS ONLY!  YOU NEED TO HAVE A COVID 19 TEST ON_11/11______ @_11 :15______, THIS TEST MUST BE DONE BEFORE SURGERY,  COVID TESTING SITE [address] [place] [address], IT IS ON THE RIGHT GOING OUT [address] APPROXIMATELY  2 MINUTES PAST [employer] ON THE RIGHT. ONCE YOUR COVID TEST IS COMPLETED,  PLEASE BEGIN THE QUARANTINE INSTRUCTIONS AS OUTLINED IN YOUR HANDOUT.                Eron Goble    Your procedure is scheduled on: 04/24/20   Report to [facility] Main  Entrance   Report to admitting at   11:15 AM     Call this number if you have problems the morning of surgery [phone]   . BRUSH YOUR TEETH MORNING OF SURGERY AND RINSE YOUR MOUTH OUT, NO CHEWING GUM CANDY OR MINTS.   No food after midnight.    You may have clear liquid until 10:30 AM.      CLEAR LIQUID DIET   Foods Allowed                                                                     Foods Excluded  Coffee and tea, regular and decaf                             liquids that you cannot  Plain Jell-O any favor except red or purple                                           see through such as: Fruit ices (not with fruit pulp)                                     milk, soups, orange juice  Iced Popsicles                                    All solid food Carbonated beverages, regular and diet                                    Cranberry, grape and apple juices Sports drinks like Gatorade Lightly seasoned clear broth or consume(fat free) Sugar, honey syrup      At 10:30 AM drink pre surgery drink.   Nothing by mouth after 10:30 AM  .  Take these medicines the morning of surgery with A SIP OF WATER: prednisone  You  may not have any metal on your body including hair pins and              piercings  Do not wear jewelry, make-up, lotions, powders or perfumes, deodorant             Do not wear nail polish on your fingernails.  Do not shave  48 hours prior to surgery.     Do not bring valuables to the hospital. [place].  Contacts, dentures or bridgework may not be worn into surgery.                   Please read over the following fact sheets you were given: _____________________________________________________________________             [facility] - Preparing for Surgery Before surgery, you can play an important role.   Because skin is not sterile, your skin needs to be as free of germs as possible .  You can reduce the number of germs on your skin by washing with CHG (chlorahexidine gluconate) soap before surgery .  CHG is an antiseptic cleaner which kills germs and bonds with the skin to continue killing germs even after washing. Please DO NOT use if you have an allergy to CHG or antibacterial soaps .  If your skin becomes reddened/irritated stop using the CHG and inform your nurse when you arrive at Short Stay. Do not shave (including legs and underarms) for at least 48 hours prior to the first CHG shower.   . Please follow these instructions carefully:  1.  Shower with CHG Soap the night before surgery and the  morning of Surgery.  2.  If you choose to wash your hair, wash your hair first as usual with your  normal  shampoo.  3.  After you shampoo, rinse your hair and body thoroughly to remove the  shampoo.                                        4.  Use CHG as you would any other liquid soap.  You can apply chg directly  to the skin and wash                       Gently with a scrungie or clean washcloth.  5.  Apply the CHG Soap to your body ONLY FROM THE NECK DOWN.   Do not use on face/ open                           Wound or open sores.  Avoid contact with eyes, ears mouth and genitals (private parts).                       Wash face,  Genitals (private parts) with your normal soap.             6.  Wash thoroughly, paying special attention to the area where your surgery  will be performed.  7.  Thoroughly rinse your body with warm water from the neck down.  8.  DO NOT shower/wash with your normal soap after using and rinsing off  the CHG Soap.  9.  Pat yourself dry with a clean towel.            10.  Wear clean pajamas.            11.  Place clean sheets on your bed the night of your first shower and do not  sleep with pets. Day of Surgery : Do not apply any lotions/deodorants the morning of surgery.  Please wear clean clothes to the hospital/surgery center.  FAILURE TO FOLLOW THESE INSTRUCTIONS MAY RESULT IN THE CANCELLATION OF YOUR SURGERY PATIENT SIGNATURE_________________________________  NURSE SIGNATURE__________________________________  ________________________________________________________________________   Adam Phenix  An incentive spirometer is a tool that can help keep your lungs clear and active. This tool measures how well you are filling your lungs with each breath. Taking long deep breaths may help reverse or decrease the chance of developing breathing (pulmonary) problems (especially infection) following:  A long period of time when you are unable to move or be active. BEFORE THE PROCEDURE   If the spirometer includes an indicator to show your best effort, your nurse or respiratory therapist will set it to a desired goal.  If possible, sit up straight or lean slightly forward. Try not to slouch.  Hold the incentive spirometer in an upright position. INSTRUCTIONS FOR USE  1. Sit on the edge of your bed if possible, or sit up as far as you can in bed or on a chair. 2. Hold the incentive spirometer in an upright position. 3. Breathe out normally. 4. Place the mouthpiece in your  mouth and seal your lips tightly around it. 5. Breathe in slowly and as deeply as possible, raising the piston or the ball toward the top of the column. 6. Hold your breath for 3-5 seconds or for as long as possible. Allow the piston or ball to fall to the bottom of the column. 7. Remove the mouthpiece from your mouth and breathe out normally. 8. Rest for a few seconds and repeat Steps 1 through 7 at least 10 times every 1-2 hours when you are awake. Take your time and take a few normal breaths between deep breaths. 9. The spirometer may include an indicator to show your best effort. Use the indicator as a goal to work toward during each repetition. 10. After each set of 10 deep breaths, practice coughing to be sure your lungs are clear. If you have an incision (the cut made at the time of surgery), support your incision when coughing by placing a pillow or rolled up towels firmly against it. Once you are able to get out of bed, walk around indoors and cough well. You may stop using the incentive spirometer when instructed by your caregiver.  RISKS AND COMPLICATIONS  Take your time so you do not get dizzy or light-headed.  If you are in pain, you may need to take or ask for pain medication before doing incentive spirometry. It is harder to take a deep breath if you are having pain. AFTER USE  Rest and breathe slowly and easily.  It can be helpful to keep track of a log of your progress. Your caregiver can provide you with a simple table to help with this. If you are using the spirometer at home, follow these instructions: [place] IF:   You are having difficultly using the spirometer.  You have trouble using the spirometer as often as instructed.  Your pain medication is not giving enough relief while using the spirometer.  You develop  fever of 100.5 F (38.1 C) or higher. SEEK IMMEDIATE MEDICAL CARE IF:   You cough up bloody sputum that had not been present before.  You  develop fever of 102 F (38.9 C) or greater.  You develop worsening pain at or near the incision site. MAKE SURE YOU:   Understand these instructions.  Will watch your condition.  Will get help right away if you are not doing well or get worse. Document Released: 10/07/2006 Document Revised: 08/19/2011 Document Reviewed: 12/08/2006 [facility] Patient Information 2014 [place], [place].   ________________________________________________________________________

## 2020-04-18 ENCOUNTER — Encounter (HOSPITAL_COMMUNITY): Payer: Self-pay

## 2020-04-18 ENCOUNTER — Other Ambulatory Visit: Payer: Self-pay

## 2020-04-18 ENCOUNTER — Encounter (HOSPITAL_COMMUNITY)
Admission: RE | Admit: 2020-04-18 | Discharge: 2020-04-18 | Disposition: A | Discharge status: Home / Self Care | Payer: Medicare Other | Source: Ambulatory Visit | Attending: Orthopedic Surgery | Admitting: Orthopedic Surgery

## 2020-04-18 DIAGNOSIS — Z01812 Encounter for preprocedural laboratory examination: Secondary | ICD-10-CM | POA: Diagnosis not present

## 2020-04-18 LAB — COMPREHENSIVE METABOLIC PANEL WITH GFR
ALT: 12 U/L (ref 0–44)
AST: 17 U/L (ref 15–41)
Albumin: 3.4 g/dL — ABNORMAL LOW (ref 3.5–5.0)
Alkaline Phosphatase: 54 U/L (ref 38–126)
Anion gap: 6 (ref 5–15)
BUN: 11 mg/dL (ref 8–23)
CO2: 27 mmol/L (ref 22–32)
Calcium: 8.9 mg/dL (ref 8.9–10.3)
Chloride: 107 mmol/L (ref 98–111)
Creatinine, Ser: 0.8 mg/dL (ref 0.44–1.00)
GFR, Estimated: >60 mL/min (ref >60)
Glucose, Bld: 82 mg/dL (ref 70–99)
Potassium: 3.5 mmol/L (ref 3.5–5.1)
Sodium: 140 mmol/L (ref 135–145)
Total Bilirubin: 0.7 mg/dL (ref 0.3–1.2)
Total Protein: 7.3 g/dL (ref 6.5–8.1)

## 2020-04-18 LAB — CBC
HCT: 46.9 % — ABNORMAL HIGH (ref 36.0–46.0)
Hemoglobin: 14.7 g/dL (ref 12.0–15.0)
MCH: 29.1 pg (ref 26.0–34.0)
MCHC: 31.3 g/dL (ref 30.0–36.0)
MCV: 92.9 fL (ref 80.0–100.0)
Platelets: 157 10*3/uL (ref 150–400)
RBC: 5.05 MIL/uL (ref 3.87–5.11)
RDW: 15 % (ref 11.5–15.5)
WBC: 8.1 10*3/uL (ref 4.0–10.5)
nRBC: 0 % (ref 0.0–0.2)

## 2020-04-18 LAB — APTT: aPTT: 25 seconds (ref 24–36)

## 2020-04-18 LAB — PROTIME-INR
INR: 0.9 (ref 0.8–1.2)
Prothrombin Time: 12.1 s (ref 11.4–15.2)

## 2020-04-18 LAB — SURGICAL PCR SCREEN
MRSA, PCR: NEGATIVE
Staphylococcus aureus: NEGATIVE

## 2020-04-18 NOTE — Progress Notes (Signed)
COVID Vaccine Completed:Yes Date COVID Vaccine completed:09/01/19 -Booster 04/01/20 COVID vaccine manufacturer: Pfizer     PCP - Dr. Arlyce Harman Cardiologist - no  Chest x-ray - 09/16/19-Epic EKG - no Stress Test - no ECHO - no Cardiac Cath -no  Pacemaker/ICD device last checked:na  Sleep Study - NO CPAP -   Fasting Blood Sugar - na Checks Blood Sugar _____ times a day  Blood Thinner Instructions:na Aspirin Instructions: Last Dose:  Anesthesia review:   Patient denies shortness of breath, fever, cough and chest pain at PAT appointment YES   Patient verbalized understanding of instructions that were given to them at the PAT appointment. Patient was also instructed that they will need to review over the PAT instructions again at home before surgery. Yes Pt doesn't climb stairs.  She does garden, work around [employer] and ADLs without SOB

## 2020-04-20 ENCOUNTER — Other Ambulatory Visit (HOSPITAL_COMMUNITY)
Admission: RE | Admit: 2020-04-20 | Discharge: 2020-04-20 | Disposition: A | Discharge status: Home / Self Care | Payer: Medicare Other | Source: Ambulatory Visit | Attending: Orthopedic Surgery | Admitting: Orthopedic Surgery

## 2020-04-20 DIAGNOSIS — Z01812 Encounter for preprocedural laboratory examination: Secondary | ICD-10-CM | POA: Diagnosis not present

## 2020-04-20 DIAGNOSIS — Z20822 Contact with and (suspected) exposure to covid-19: Secondary | ICD-10-CM | POA: Diagnosis not present

## 2020-04-20 LAB — SARS CORONAVIRUS 2 (TAT 6-24 HRS): SARS Coronavirus 2: NEGATIVE

## 2020-04-21 NOTE — Progress Notes (Signed)
Pt told about surgery time change.

## 2020-04-23 MED ORDER — BUPIVACAINE LIPOSOME 1.3 % IJ SUSP
20.0000 mL | Freq: Once | INTRAMUSCULAR | Status: DC
  Filled 2020-04-23: qty 20

## 2020-04-23 NOTE — H&P (Signed)
TOTAL KNEE ADMISSION H&P  Patient is being admitted for left total knee arthroplasty.  Subjective:  Chief Complaint:left knee pain.  HPI: Jane Chavez, 74 y.o. female, has a history of pain and functional disability in the left knee due to arthritis and has failed non-surgical conservative treatments for greater than 12 weeks to includeNSAID's and/or analgesics and activity modification.  Onset of symptoms was gradual, starting 1 years ago with gradually worsening course since that time. The patient noted no past surgery on the left knee(s).  Patient currently rates pain in the left knee(s) at 7 out of 10 with activity. Patient has worsening of pain with activity and weight bearing and pain that interferes with activities of daily living.  Patient has evidence of joint space narrowing by imaging studies.There is no active infection.  Patient Active Problem List   Diagnosis Date Noted  . Abnormal CT of the abdomen   . Abdominal pain, epigastric   . SOB (shortness of breath)   . Acute respiratory failure with hypoxia ([place]) 10/03/2015  . HLD (hyperlipidemia) 10/03/2015  . Left ventricular diastolic dysfunction, NYHA class 2 10/03/2015  . SLE (systemic lupus erythematosus) ([place]) 10/03/2015  . Enlarged thyroid gland 10/03/2015  . Recurrent pleural effusion on left   . Bilateral pleural effusion (Recurrent) 09/21/2015  . GERD (gastroesophageal reflux disease) 09/21/2015   Past Medical History:  Diagnosis Date  . Back stiffness 03/13/2017   Pt.'s Dr. thinks it is Lupus related.  . Diverticulosis   . GERD (gastroesophageal reflux disease)   . History of colon polyps   . HLD (hyperlipidemia)   . Lupus ([place]) 2014  . Pleural effusion 09/2015    Past Surgical History:  Procedure Laterality Date  . BREAST LUMPECTOMY Right   . CATARACT EXTRACTION, BILATERAL    . ESOPHAGOGASTRODUODENOSCOPY N/A 09/09/2017   Procedure: ESOPHAGOGASTRODUODENOSCOPY (EGD);  Surgeon: Yetta Flock, MD;   Location: [facility];  Service: Gastroenterology;  Laterality: N/A;  . REPLACEMENT TOTAL KNEE Right     Current Facility-Administered Medications  Medication Dose Route Frequency Provider Last Rate Last Admin  . [START ON 04/24/2020] bupivacaine liposome (EXPAREL) 1.3 % injection 266 mg  20 mL Other Once Gaynelle Arabian, MD       Current Outpatient Medications  Medication Sig Dispense Refill Last Dose  . azaTHIOprine (IMURAN) 50 MG tablet Take 100 mg by mouth daily.   2   . carboxymethylcellul-glycerin (REFRESH OPTIVE) 0.5-0.9 % ophthalmic solution Place 1 drop into both eyes 2 (two) times daily as needed for dry eyes.     . cholecalciferol (VITAMIN D3) 25 MCG (1000 UNIT) tablet Take 1,000 Units by mouth daily.     . famotidine (PEPCID) 20 MG tablet TAKE 1-2 TABLETS (20-40 MG TOTAL) BY MOUTH DAILY. (Patient taking differently: Take 20-40 mg by mouth daily as needed for heartburn or indigestion. ) 180 tablet 1   . furosemide (LASIX) 80 MG tablet Take 80 mg by mouth daily.   0   . hydroxychloroquine (PLAQUENIL) 200 MG tablet Take 1 tablet (200 mg total) by mouth daily. 30 tablet 0   . potassium chloride SA (K-DUR,KLOR-CON) 20 MEQ tablet Take 20 mEq by mouth daily.  0   . predniSONE (DELTASONE) 5 MG tablet Take 5 mg by mouth daily with breakfast.      . vitamin B-12 (CYANOCOBALAMIN) 1000 MCG tablet Take 1,000 mcg by mouth daily.     . Calcium Carb-Cholecalciferol (CALCIUM 500 +D) 500-400 MG-UNIT TABS Take 1 tablet by mouth daily.  (  Patient not taking: Reported on 04/13/2020)   Not Taking at Unknown time   Allergies  Allergen Reactions  . Penicillins Shortness Of Breath    Has patient had a PCN reaction causing immediate rash, facial/tongue/throat swelling, SOB or lightheadedness with hypotension: Yes Has patient had a PCN reaction causing severe rash involving mucus membranes or skin necrosis: No Has patient had a PCN reaction that required hospitalization No Has patient had a PCN reaction  occurring within the last 10 years: No If all of the above answers are "NO", then may proceed with Cephalosporin use.     Social History   Tobacco Use  . Smoking status: Never Smoker  . Smokeless tobacco: Never Used  Substance Use Topics  . Alcohol use: No    Family History  Problem Relation Age of Onset  . Colon cancer Sister   . Other Brother        colon surgery, growth removed  . Colon cancer Other        niece, deceased 11-Sep-2016  . Asthma Mother      Review of Systems  Constitutional: Negative for chills and fever.  Respiratory: Negative for cough and shortness of breath.   Cardiovascular: Negative for chest pain.  Gastrointestinal: Negative for nausea and vomiting.  Musculoskeletal: Positive for arthralgias.    Objective:  Physical Exam Patient is a 74 year old female.  Well nourished and well developed. General: Alert and oriented x3, cooperative and pleasant, no acute distress. Head: normocephalic, atraumatic, neck supple. Eyes: EOMI. Respiratory: breath sounds clear in all fields, no wheezing, rales, or rhonchi. Cardiovascular: Regular rate and rhythm, no murmurs, gallops or rubs. Abdomen: non-tender to palpation and soft, normoactive bowel sounds. Musculoskeletal:  Left Knee Exam: No effusion. No Swelling. Significant varus deformity. Range of motion is 5-120 degrees. Moderate crepitus on range of motion of the knee. Positive medial greater than lateral joint line tenderness. Stable knee.  Calves soft and nontender. Motor function intact in LE. Strength 5/5 LE bilaterally. Neuro: Distal pulses 2+. Sensation to light touch intact in LE. Vital signs in last 24 hours:    Labs:   Estimated body mass index is 30.27 kg/m as calculated from the following:   Height as of 04/18/20: 5\' 1"  (1.549 m).   Weight as of 04/18/20: 72.7 kg.   Imaging Review Plain radiographs demonstrate severe degenerative joint disease of the left knee(s). The overall  alignment ismild varus. The bone quality appears to be adequate for age and reported activity level.      Assessment/Plan:  End stage arthritis, left knee   The patient history, physical examination, clinical judgment of the provider and imaging studies are consistent with end stage degenerative joint disease of the left knee(s) and total knee arthroplasty is deemed medically necessary. The treatment options including medical management, injection therapy arthroscopy and arthroplasty were discussed at length. The risks and benefits of total knee arthroplasty were presented and reviewed. The risks due to aseptic loosening, infection, stiffness, patella tracking problems, thromboembolic complications and other imponderables were discussed. The patient acknowledged the explanation, agreed to proceed with the plan and consent was signed. Patient is being admitted for inpatient treatment for surgery, pain control, PT, OT, prophylactic antibiotics, VTE prophylaxis, progressive ambulation and ADL's and discharge planning. The patient is planning to be discharged home.   Therapy Plans: outpatient therapy at [facility] on 1/17 Disposition: Home with son and daughter Planned DVT Prophylaxis: aspirin 325mg  BID DME needed: walker, 3-n-1 PCP: Vassie Moment, NP,  clearance received Rheumatologist: Dr. Dorathy Daft TXA: IV Allergies: PCN - shortness of breath, palpitations Anesthesia Concerns: none BMI: 31.7 Not diabetic.  Other: Staying overnight. Hx of lupus.   - Patient was instructed on what medications to stop prior to surgery. - Follow-up visit in 2 weeks with Dr. Wynelle Link - Begin physical therapy following surgery - Pre-operative lab work as pre-surgical testing - Prescriptions will be provided in hospital at time of discharge     Patient's anticipated LOS is less than 2 midnights, meeting these requirements: - Younger than 67 - Lives within 1 hour of care - Has a competent adult at home to  recover with post-op recover - NO history of  - Chronic pain requiring opiods  - Diabetes  - Coronary Artery Disease  - Heart failure  - Heart attack  - Stroke  - DVT/VTE  - Cardiac arrhythmia  - Respiratory Failure/COPD  - Renal failure  - Anemia  - Advanced Liver disease   Griffith Citron, PA-C Orthopedic Surgery [facility] [place] [phone]

## 2020-04-24 ENCOUNTER — Ambulatory Visit (HOSPITAL_COMMUNITY): Payer: Medicare Other | Admitting: Anesthesiology

## 2020-04-24 ENCOUNTER — Encounter (HOSPITAL_COMMUNITY)
Admission: RE | Disposition: A | Discharge status: Home / Self Care | Payer: Self-pay | Source: Home / Self Care | Attending: Orthopedic Surgery

## 2020-04-24 ENCOUNTER — Other Ambulatory Visit: Payer: Self-pay

## 2020-04-24 ENCOUNTER — Encounter (HOSPITAL_COMMUNITY): Payer: Self-pay | Admitting: Orthopedic Surgery

## 2020-04-24 ENCOUNTER — Inpatient Hospital Stay (HOSPITAL_COMMUNITY)
Admission: RE | Admit: 2020-04-24 | Discharge: 2020-04-26 | DRG: 470 | Disposition: A | Discharge status: Home / Self Care | Payer: Medicare Other | Attending: Orthopedic Surgery | Admitting: Orthopedic Surgery

## 2020-04-24 DIAGNOSIS — M179 Osteoarthritis of knee, unspecified: Secondary | ICD-10-CM | POA: Diagnosis present

## 2020-04-24 DIAGNOSIS — Z7952 Long term (current) use of systemic steroids: Secondary | ICD-10-CM | POA: Diagnosis not present

## 2020-04-24 DIAGNOSIS — E785 Hyperlipidemia, unspecified: Secondary | ICD-10-CM | POA: Diagnosis not present

## 2020-04-24 DIAGNOSIS — M1710 Unilateral primary osteoarthritis, unspecified knee: Secondary | ICD-10-CM

## 2020-04-24 DIAGNOSIS — M329 Systemic lupus erythematosus, unspecified: Secondary | ICD-10-CM | POA: Diagnosis present

## 2020-04-24 DIAGNOSIS — K219 Gastro-esophageal reflux disease without esophagitis: Secondary | ICD-10-CM | POA: Diagnosis present

## 2020-04-24 DIAGNOSIS — M1712 Unilateral primary osteoarthritis, left knee: Secondary | ICD-10-CM | POA: Diagnosis present

## 2020-04-24 DIAGNOSIS — Z79899 Other long term (current) drug therapy: Secondary | ICD-10-CM | POA: Diagnosis not present

## 2020-04-24 DIAGNOSIS — Z825 Family history of asthma and other chronic lower respiratory diseases: Secondary | ICD-10-CM

## 2020-04-24 DIAGNOSIS — Z96651 Presence of right artificial knee joint: Secondary | ICD-10-CM | POA: Diagnosis not present

## 2020-04-24 DIAGNOSIS — G8918 Other acute postprocedural pain: Secondary | ICD-10-CM | POA: Diagnosis not present

## 2020-04-24 HISTORY — PX: TOTAL KNEE ARTHROPLASTY: SHX125

## 2020-04-24 LAB — ABO/RH: ABO/RH(D): A POS

## 2020-04-24 LAB — TYPE AND SCREEN
ABO/RH(D): A POS
Antibody Screen: NEGATIVE

## 2020-04-24 SURGERY — ARTHROPLASTY, KNEE, TOTAL
Anesthesia: Spinal | Site: Knee | Laterality: Left

## 2020-04-24 MED ORDER — DIPHENHYDRAMINE HCL 12.5 MG/5ML PO ELIX: 12.5000 mg | ORAL_SOLUTION | ORAL | Status: DC | PRN

## 2020-04-24 MED ORDER — ACETAMINOPHEN 325 MG PO TABS: 325.0000 mg | ORAL_TABLET | Freq: Once | ORAL | Status: DC | PRN

## 2020-04-24 MED ORDER — AMISULPRIDE (ANTIEMETIC) 5 MG/2ML IV SOLN: 10.0000 mg | Freq: Once | INTRAVENOUS | Status: DC | PRN

## 2020-04-24 MED ORDER — METOCLOPRAMIDE HCL 5 MG PO TABS: 5.0000 mg | ORAL_TABLET | Freq: Three times a day (TID) | ORAL | Status: DC | PRN

## 2020-04-24 MED ORDER — VANCOMYCIN HCL IN DEXTROSE 1-5 GM/200ML-% IV SOLN
1000.0000 mg | Freq: Two times a day (BID) | INTRAVENOUS | Status: AC
  Administered 2020-04-24: 1000 mg via INTRAVENOUS
  Filled 2020-04-24: qty 200

## 2020-04-24 MED ORDER — BUPIVACAINE LIPOSOME 1.3 % IJ SUSP
INTRAMUSCULAR | Status: DC | PRN
  Administered 2020-04-24: 20 mL

## 2020-04-24 MED ORDER — DEXAMETHASONE SODIUM PHOSPHATE 10 MG/ML IJ SOLN: 8.0000 mg | Freq: Once | INTRAMUSCULAR | Status: DC

## 2020-04-24 MED ORDER — LACTATED RINGERS IV SOLN: INTRAVENOUS | Status: DC

## 2020-04-24 MED ORDER — ONDANSETRON HCL 4 MG/2ML IJ SOLN: 4.0000 mg | Freq: Four times a day (QID) | INTRAMUSCULAR | Status: DC | PRN

## 2020-04-24 MED ORDER — ACETAMINOPHEN 160 MG/5ML PO SOLN: 325.0000 mg | Freq: Once | ORAL | Status: DC | PRN

## 2020-04-24 MED ORDER — PREDNISONE 5 MG PO TABS
5.0000 mg | ORAL_TABLET | Freq: Two times a day (BID) | ORAL | Status: DC
  Administered 2020-04-26: 5 mg via ORAL
  Filled 2020-04-24 (×3): qty 1

## 2020-04-24 MED ORDER — POLYETHYLENE GLYCOL 3350 17 G PO PACK: 17.0000 g | PACK | Freq: Every day | ORAL | Status: DC | PRN

## 2020-04-24 MED ORDER — MEPERIDINE HCL 50 MG/ML IJ SOLN: 6.2500 mg | INTRAMUSCULAR | Status: DC | PRN

## 2020-04-24 MED ORDER — BUPIVACAINE IN DEXTROSE 0.75-8.25 % IT SOLN
INTRATHECAL | Status: DC | PRN
  Administered 2020-04-24: 1.8 mL via INTRATHECAL

## 2020-04-24 MED ORDER — ACETAMINOPHEN 10 MG/ML IV SOLN
1000.0000 mg | Freq: Four times a day (QID) | INTRAVENOUS | Status: DC
  Administered 2020-04-24: 1000 mg via INTRAVENOUS
  Filled 2020-04-24: qty 100

## 2020-04-24 MED ORDER — PREDNISONE 5 MG PO TABS
10.0000 mg | ORAL_TABLET | Freq: Two times a day (BID) | ORAL | Status: AC
  Administered 2020-04-25 (×2): 10 mg via ORAL
  Filled 2020-04-24: qty 2
  Filled 2020-04-24: qty 1
  Filled 2020-04-24: qty 2

## 2020-04-24 MED ORDER — SODIUM CHLORIDE (PF) 0.9 % IJ SOLN
INTRAMUSCULAR | Status: AC
  Filled 2020-04-24: qty 10

## 2020-04-24 MED ORDER — ONDANSETRON HCL 4 MG/2ML IJ SOLN
INTRAMUSCULAR | Status: DC | PRN
  Administered 2020-04-24: 4 mg via INTRAVENOUS

## 2020-04-24 MED ORDER — MENTHOL 3 MG MT LOZG: 1.0000 | LOZENGE | OROMUCOSAL | Status: DC | PRN

## 2020-04-24 MED ORDER — PROPOFOL 500 MG/50ML IV EMUL
INTRAVENOUS | Status: DC | PRN
  Administered 2020-04-24: 120 ug/kg/min via INTRAVENOUS

## 2020-04-24 MED ORDER — METHOCARBAMOL 500 MG PO TABS
500.0000 mg | ORAL_TABLET | Freq: Four times a day (QID) | ORAL | Status: DC | PRN
  Administered 2020-04-24 – 2020-04-25 (×3): 500 mg via ORAL
  Filled 2020-04-24 (×3): qty 1

## 2020-04-24 MED ORDER — STERILE WATER FOR IRRIGATION IR SOLN
Status: DC | PRN
  Administered 2020-04-24: 2000 mL

## 2020-04-24 MED ORDER — ACETAMINOPHEN 10 MG/ML IV SOLN: 1000.0000 mg | Freq: Once | INTRAVENOUS | Status: DC | PRN

## 2020-04-24 MED ORDER — POLYVINYL ALCOHOL 1.4 % OP SOLN
1.0000 [drp] | Freq: Two times a day (BID) | OPHTHALMIC | Status: DC | PRN
  Filled 2020-04-24: qty 15

## 2020-04-24 MED ORDER — TRANEXAMIC ACID-NACL 1000-0.7 MG/100ML-% IV SOLN
1000.0000 mg | INTRAVENOUS | Status: AC
  Administered 2020-04-24: 1000 mg via INTRAVENOUS
  Filled 2020-04-24: qty 100

## 2020-04-24 MED ORDER — DEXAMETHASONE SODIUM PHOSPHATE 10 MG/ML IJ SOLN
INTRAMUSCULAR | Status: DC | PRN
  Administered 2020-04-24: 10 mg via INTRAVENOUS

## 2020-04-24 MED ORDER — MIDAZOLAM HCL 2 MG/2ML IJ SOLN
1.0000 mg | Freq: Once | INTRAMUSCULAR | Status: AC
  Administered 2020-04-24: 1 mg via INTRAVENOUS
  Filled 2020-04-24: qty 2

## 2020-04-24 MED ORDER — DOCUSATE SODIUM 100 MG PO CAPS
100.0000 mg | ORAL_CAPSULE | Freq: Two times a day (BID) | ORAL | Status: DC
  Administered 2020-04-24 – 2020-04-26 (×4): 100 mg via ORAL
  Filled 2020-04-24 (×4): qty 1

## 2020-04-24 MED ORDER — ONDANSETRON HCL 4 MG PO TABS: 4.0000 mg | ORAL_TABLET | Freq: Four times a day (QID) | ORAL | Status: DC | PRN

## 2020-04-24 MED ORDER — FAMOTIDINE 20 MG PO TABS: 20.0000 mg | ORAL_TABLET | Freq: Every day | ORAL | Status: DC | PRN

## 2020-04-24 MED ORDER — GABAPENTIN 300 MG PO CAPS
300.0000 mg | ORAL_CAPSULE | Freq: Three times a day (TID) | ORAL | Status: DC
  Administered 2020-04-24 – 2020-04-26 (×5): 300 mg via ORAL
  Filled 2020-04-24 (×5): qty 1

## 2020-04-24 MED ORDER — PHENOL 1.4 % MT LIQD: 1.0000 | OROMUCOSAL | Status: DC | PRN

## 2020-04-24 MED ORDER — METOCLOPRAMIDE HCL 5 MG/ML IJ SOLN: 5.0000 mg | Freq: Three times a day (TID) | INTRAMUSCULAR | Status: DC | PRN

## 2020-04-24 MED ORDER — DEXAMETHASONE SODIUM PHOSPHATE 10 MG/ML IJ SOLN: 10.0000 mg | Freq: Once | INTRAMUSCULAR | Status: DC

## 2020-04-24 MED ORDER — SODIUM CHLORIDE (PF) 0.9 % IJ SOLN
INTRAMUSCULAR | Status: DC | PRN
  Administered 2020-04-24: 60 mL

## 2020-04-24 MED ORDER — FLEET ENEMA 7-19 GM/118ML RE ENEM: 1.0000 | ENEMA | Freq: Once | RECTAL | Status: DC | PRN

## 2020-04-24 MED ORDER — CHLORHEXIDINE GLUCONATE 0.12 % MT SOLN
15.0000 mL | Freq: Once | OROMUCOSAL | Status: AC
  Administered 2020-04-24: 15 mL via OROMUCOSAL

## 2020-04-24 MED ORDER — BISACODYL 10 MG RE SUPP: 10.0000 mg | Freq: Every day | RECTAL | Status: DC | PRN

## 2020-04-24 MED ORDER — VANCOMYCIN HCL IN DEXTROSE 1-5 GM/200ML-% IV SOLN
1000.0000 mg | INTRAVENOUS | Status: AC
  Administered 2020-04-24: 1000 mg via INTRAVENOUS
  Filled 2020-04-24: qty 200

## 2020-04-24 MED ORDER — OXYCODONE HCL 5 MG PO TABS
5.0000 mg | ORAL_TABLET | ORAL | Status: DC | PRN
  Administered 2020-04-24: 10 mg via ORAL
  Administered 2020-04-24: 5 mg via ORAL
  Administered 2020-04-25 – 2020-04-26 (×5): 10 mg via ORAL
  Filled 2020-04-24 (×4): qty 2
  Filled 2020-04-24: qty 1
  Filled 2020-04-24 (×2): qty 2

## 2020-04-24 MED ORDER — ROPIVACAINE HCL 7.5 MG/ML IJ SOLN
INTRAMUSCULAR | Status: DC | PRN
  Administered 2020-04-24: 20 mL via PERINEURAL

## 2020-04-24 MED ORDER — ACETAMINOPHEN 325 MG PO TABS: 325.0000 mg | ORAL_TABLET | Freq: Four times a day (QID) | ORAL | Status: DC | PRN

## 2020-04-24 MED ORDER — HYDROMORPHONE HCL 1 MG/ML IJ SOLN: 0.2500 mg | INTRAMUSCULAR | Status: DC | PRN

## 2020-04-24 MED ORDER — SODIUM CHLORIDE 0.9 % IR SOLN
Status: DC | PRN
  Administered 2020-04-24: 1000 mL

## 2020-04-24 MED ORDER — SODIUM CHLORIDE (PF) 0.9 % IJ SOLN
INTRAMUSCULAR | Status: AC
  Filled 2020-04-24: qty 50

## 2020-04-24 MED ORDER — MORPHINE SULFATE (PF) 2 MG/ML IV SOLN: 0.5000 mg | INTRAVENOUS | Status: DC | PRN

## 2020-04-24 MED ORDER — FENTANYL CITRATE (PF) 100 MCG/2ML IJ SOLN
50.0000 ug | Freq: Once | INTRAMUSCULAR | Status: AC
  Administered 2020-04-24: 50 ug via INTRAVENOUS
  Filled 2020-04-24: qty 2

## 2020-04-24 MED ORDER — TRAMADOL HCL 50 MG PO TABS
50.0000 mg | ORAL_TABLET | Freq: Four times a day (QID) | ORAL | Status: DC | PRN
  Administered 2020-04-24: 100 mg via ORAL
  Filled 2020-04-24: qty 2

## 2020-04-24 MED ORDER — SODIUM CHLORIDE 0.9 % IV SOLN: INTRAVENOUS | Status: DC

## 2020-04-24 MED ORDER — POVIDONE-IODINE 10 % EX SWAB
2.0000 "application " | Freq: Once | CUTANEOUS | Status: AC
  Administered 2020-04-24: 2 via TOPICAL

## 2020-04-24 MED ORDER — ORAL CARE MOUTH RINSE: 15.0000 mL | Freq: Once | OROMUCOSAL | Status: AC

## 2020-04-24 MED ORDER — METHOCARBAMOL 500 MG IVPB - SIMPLE MED
500.0000 mg | Freq: Four times a day (QID) | INTRAVENOUS | Status: DC | PRN
  Filled 2020-04-24: qty 50

## 2020-04-24 MED ORDER — 0.9 % SODIUM CHLORIDE (POUR BTL) OPTIME
TOPICAL | Status: DC | PRN
  Administered 2020-04-24: 1000 mL

## 2020-04-24 MED ORDER — POTASSIUM CHLORIDE CRYS ER 20 MEQ PO TBCR
20.0000 meq | EXTENDED_RELEASE_TABLET | Freq: Every day | ORAL | Status: DC
  Administered 2020-04-24 – 2020-04-26 (×3): 20 meq via ORAL
  Filled 2020-04-24 (×3): qty 1

## 2020-04-24 MED ORDER — FUROSEMIDE 40 MG PO TABS
80.0000 mg | ORAL_TABLET | Freq: Every day | ORAL | Status: DC
  Administered 2020-04-25 – 2020-04-26 (×2): 80 mg via ORAL
  Filled 2020-04-24 (×2): qty 2

## 2020-04-24 MED ORDER — ASPIRIN EC 325 MG PO TBEC
325.0000 mg | DELAYED_RELEASE_TABLET | Freq: Two times a day (BID) | ORAL | Status: DC
  Administered 2020-04-25 – 2020-04-26 (×3): 325 mg via ORAL
  Filled 2020-04-24 (×3): qty 1

## 2020-04-24 SURGICAL SUPPLY — 51 items
ATTUNE MED DOME PAT 32 KNEE (Knees) ×2 IMPLANT
ATTUNE PS FEM LT SZ 4 CEM KNEE (Femur) ×2 IMPLANT
ATTUNE PSRP INSR SZ4 8 KNEE (Insert) ×2 IMPLANT
BAG ZIPLOCK 12X15 (MISCELLANEOUS) ×2 IMPLANT
BASE TIBIAL ROT PLAT SZ 3 KNEE (Knees) ×1 IMPLANT
BLADE SAG 18X100X1.27 (BLADE) ×2 IMPLANT
BLADE SAW SGTL 11.0X1.19X90.0M (BLADE) ×2 IMPLANT
BLADE SURG SZ10 CARB STEEL (BLADE) ×4 IMPLANT
BNDG ELASTIC 6X5.8 VLCR STR LF (GAUZE/BANDAGES/DRESSINGS) ×2 IMPLANT
BOWL SMART MIX CTS (DISPOSABLE) ×2 IMPLANT
CEMENT HV SMART SET (Cement) ×4 IMPLANT
COVER SURGICAL LIGHT HANDLE (MISCELLANEOUS) ×2 IMPLANT
COVER WAND RF STERILE (DRAPES) IMPLANT
CUFF TOURN SGL QUICK 34 (TOURNIQUET CUFF) ×1
CUFF TRNQT CYL 34X4.125X (TOURNIQUET CUFF) ×1 IMPLANT
DECANTER SPIKE VIAL GLASS SM (MISCELLANEOUS) ×2 IMPLANT
DRAPE U-SHAPE 47X51 STRL (DRAPES) ×2 IMPLANT
DRSG AQUACEL AG ADV 3.5X10 (GAUZE/BANDAGES/DRESSINGS) ×2 IMPLANT
DURAPREP 26ML APPLICATOR (WOUND CARE) ×2 IMPLANT
ELECT REM PT RETURN 15FT ADLT (MISCELLANEOUS) ×2 IMPLANT
GLOVE BIO SURGEON STRL SZ7 (GLOVE) ×2 IMPLANT
GLOVE BIO SURGEON STRL SZ8 (GLOVE) ×2 IMPLANT
GLOVE BIOGEL PI IND STRL 7.0 (GLOVE) ×1 IMPLANT
GLOVE BIOGEL PI IND STRL 8 (GLOVE) ×1 IMPLANT
GLOVE BIOGEL PI INDICATOR 7.0 (GLOVE) ×1
GLOVE BIOGEL PI INDICATOR 8 (GLOVE) ×1
GOWN STRL REUS W/TWL LRG LVL3 (GOWN DISPOSABLE) ×4 IMPLANT
HANDPIECE INTERPULSE COAX TIP (DISPOSABLE) ×1
HOLDER FOLEY CATH W/STRAP (MISCELLANEOUS) ×2 IMPLANT
IMMOBILIZER KNEE 20 (SOFTGOODS) ×2
IMMOBILIZER KNEE 20 THIGH 36 (SOFTGOODS) ×1 IMPLANT
KIT TURNOVER KIT A (KITS) IMPLANT
MANIFOLD NEPTUNE II (INSTRUMENTS) ×2 IMPLANT
NS IRRIG 1000ML POUR BTL (IV SOLUTION) ×2 IMPLANT
PACK TOTAL KNEE CUSTOM (KITS) ×2 IMPLANT
PADDING CAST COTTON 6X4 STRL (CAST SUPPLIES) ×2 IMPLANT
PENCIL SMOKE EVACUATOR (MISCELLANEOUS) ×2 IMPLANT
PIN FIX SIGMA LCS THRD HI (PIN) ×2 IMPLANT
PIN STEINMAN FIXATION KNEE (PIN) ×2 IMPLANT
PROTECTOR NERVE ULNAR (MISCELLANEOUS) ×2 IMPLANT
SET HNDPC FAN SPRY TIP SCT (DISPOSABLE) ×1 IMPLANT
STRIP CLOSURE SKIN 1/2X4 (GAUZE/BANDAGES/DRESSINGS) ×4 IMPLANT
SUT MNCRL AB 4-0 PS2 18 (SUTURE) ×2 IMPLANT
SUT STRATAFIX 0 PDS 27 VIOLET (SUTURE) ×2
SUT VIC AB 2-0 CT1 27 (SUTURE) ×3
SUT VIC AB 2-0 CT1 TAPERPNT 27 (SUTURE) ×3 IMPLANT
SUTURE STRATFX 0 PDS 27 VIOLET (SUTURE) ×1 IMPLANT
TIBIAL BASE ROT PLAT SZ 3 KNEE (Knees) ×2 IMPLANT
TRAY FOLEY MTR SLVR 14FR STAT (SET/KITS/TRAYS/PACK) ×2 IMPLANT
WATER STERILE IRR 1000ML POUR (IV SOLUTION) ×4 IMPLANT
WRAP KNEE MAXI GEL POST OP (GAUZE/BANDAGES/DRESSINGS) ×2 IMPLANT

## 2020-04-24 NOTE — Anesthesia Procedure Notes (Addendum)
Spinal  Start time: 04/24/2020 11:32 AM End time: 04/24/2020 11:34 AM Staffing Performed: anesthesiologist  Anesthesiologist: Effie Berkshire, MD Preanesthetic Checklist Completed: patient identified, IV checked, site marked, risks and benefits discussed, surgical consent, monitors and equipment checked, pre-op evaluation and timeout performed Spinal Block Patient position: sitting Prep: DuraPrep and site prepped and draped Location: L3-4 Injection technique: single-shot Needle Needle type: Pencan  Needle gauge: 24 G Needle length: 10 cm Needle insertion depth: 10 cm Additional Notes Patient tolerated well. No immediate complications.  1st attempt CRNA 2nd attempt MDA

## 2020-04-24 NOTE — Discharge Instructions (Addendum)
Jane Arabian, MD Total Joint Specialist [facility] [address], [address] [place], [place] [address] [phone]  TOTAL KNEE REPLACEMENT POSTOPERATIVE DIRECTIONS    Knee Rehabilitation, Guidelines Following Surgery  Results after knee surgery are often greatly improved when you follow the exercise, range of motion and muscle strengthening exercises prescribed by your doctor. Safety measures are also important to protect the knee from further injury. If any of these exercises cause you to have increased pain or swelling in your knee joint, decrease the amount until you are comfortable again and slowly increase them. If you have problems or questions, call your caregiver or physical therapist for advice.   BLOOD CLOT PREVENTION . Take a 325 mg Aspirin two times a day for three weeks following surgery. Then take an 81 mg Aspirin once a day for three weeks. Then discontinue Aspirin. Dennis Bast may resume your vitamins/supplements upon discharge from the hospital. . Do not take any NSAIDs (Advil, Aleve, Ibuprofen, Meloxicam, etc.) until you have discontinued the 325 mg Aspirin.  PREDNISONE INSTRUCTIONS . Take 10 mg Prednisone twice a day on Tuesday (this will be given to you in the hospital). . Take 5 mg Prednisone twice a day on Wednesday. Marland Kitchen Resume usual Prednisone dosing on Thursday.  HOME CARE INSTRUCTIONS  . Remove items at home which could result in a fall. This includes throw rugs or furniture in walking pathways.  . ICE to the affected knee as much as tolerated. Icing helps control swelling. If the swelling is well controlled you will be more comfortable and rehab easier. Continue to use ice on the knee for pain and swelling from surgery. You may notice swelling that will progress down to the foot and ankle. This is normal after surgery. Elevate the leg when you are not up walking on it.    . Continue to use the breathing machine which will help keep your temperature  down. It is common for your temperature to cycle up and down following surgery, especially at night when you are not up moving around and exerting yourself. The breathing machine keeps your lungs expanded and your temperature down. . Do not place pillow under the operative knee, focus on keeping the knee straight while resting  DIET You may resume your previous home diet once you are discharged from the hospital.  DRESSING / [place] / SHOWERING . Keep your bulky bandage on for 2 days. On the third post-operative day you may remove the Ace bandage and gauze. There is a waterproof adhesive bandage on your skin which will stay in place until your first follow-up appointment. Once you remove this you will not need to place another bandage . You may begin showering 3 days following surgery, but do not submerge the incision under water.  ACTIVITY For the first 5 days, the key is rest and control of pain and swelling . Do your home exercises twice a day starting on post-operative day 3. On the days you go to physical therapy, just do the home exercises once that day. . You should rest, ice and elevate the leg for 50 minutes out of every hour. Get up and walk/stretch for 10 minutes per hour. After 5 days you can increase your activity slowly as tolerated. . Walk with your walker as instructed. Use the walker until you are comfortable transitioning to a cane. Walk with the cane in the opposite hand of the operative leg. You may discontinue the cane once you are comfortable  and walking steadily. . Avoid periods of inactivity such as sitting longer than an hour when not asleep. This helps prevent blood clots.  . You may discontinue the knee immobilizer once you are able to perform a straight leg raise while lying down. . You may resume a sexual relationship in one month or when given the OK by your doctor.  . You may return to work once you are cleared by your doctor.  . Do not drive a car for 6 weeks or  until released by your surgeon.  . Do not drive while taking narcotics.  TED HOSE STOCKINGS Wear the elastic stockings on both legs for three weeks following surgery during the day. You may remove them at night for sleeping.  WEIGHT BEARING Weight bearing as tolerated with assist device (walker, cane, etc) as directed, use it as long as suggested by your surgeon or therapist, typically at least 4-6 weeks.  POSTOPERATIVE CONSTIPATION PROTOCOL Constipation - defined medically as fewer than three stools per week and severe constipation as less than one stool per week.  One of the most common issues patients have following surgery is constipation.  Even if you have a regular bowel pattern at home, your normal regimen is likely to be disrupted due to multiple reasons following surgery.  Combination of anesthesia, postoperative narcotics, change in appetite and fluid intake all can affect your bowels.  In order to avoid complications following surgery, here are some recommendations in order to help you during your recovery period.  . Colace (docusate) - Pick up an over-the-counter form of Colace or another stool softener and take twice a day as long as you are requiring postoperative pain medications.  Take with a full glass of water daily.  If you experience loose stools or diarrhea, hold the colace until you stool forms back up. If your symptoms do not get better within 1 week or if they get worse, check with your doctor. . Dulcolax (bisacodyl) - Pick up over-the-counter and take as directed by the product packaging as needed to assist with the movement of your bowels.  Take with a full glass of water.  Use this product as needed if not relieved by Colace only.  . MiraLax (polyethylene glycol) - Pick up over-the-counter to have on hand. MiraLax is a solution that will increase the amount of water in your bowels to assist with bowel movements.  Take as directed and can mix with a glass of water, juice,  soda, coffee, or tea. Take if you go more than two days without a movement. Do not use MiraLax more than once per day. Call your doctor if you are still constipated or irregular after using this medication for 7 days in a row.  If you continue to have problems with postoperative constipation, please contact the office for further assistance and recommendations.  If you experience "the worst abdominal pain ever" or develop nausea or vomiting, please contact the office immediatly for further recommendations for treatment.  ITCHING If you experience itching with your medications, try taking only a single pain pill, or even half a pain pill at a time.  You can also use Benadryl over the counter for itching or also to help with sleep.   MEDICATIONS See your medication summary on the "After Visit Summary" that the nursing staff will review with you prior to discharge.  You may have some home medications which will be placed on hold until you complete the course of blood thinner medication.  It is important for you to complete the blood thinner medication as prescribed by your surgeon.  Continue your approved medications as instructed at time of discharge.  PRECAUTIONS . If you experience chest pain or shortness of breath - call 911 immediately for transfer to the hospital emergency department.  . If you develop a fever greater that 101 F, purulent drainage from wound, increased redness or drainage from wound, foul odor from the wound/dressing, or calf pain - CONTACT YOUR SURGEON.                                                   FOLLOW-UP APPOINTMENTS Make sure you keep all of your appointments after your operation with your surgeon and caregivers. You should call the office at the above phone number and make an appointment for approximately two weeks after the date of your surgery or on the date instructed by your surgeon outlined in the "After Visit Summary".  RANGE OF MOTION AND STRENGTHENING EXERCISES   Rehabilitation of the knee is important following a knee injury or an operation. After just a few days of immobilization, the muscles of the thigh which control the knee become weakened and shrink (atrophy). Knee exercises are designed to build up the tone and strength of the thigh muscles and to improve knee motion. Often times heat used for twenty to thirty minutes before working out will loosen up your tissues and help with improving the range of motion but do not use heat for the first two weeks following surgery. These exercises can be done on a training (exercise) mat, on the floor, on a table or on a bed. Use what ever works the best and is most comfortable for you Knee exercises include:  . Leg Lifts - While your knee is still immobilized in a splint or cast, you can do straight leg raises. Lift the leg to 60 degrees, hold for 3 sec, and slowly lower the leg. Repeat 10-20 times 2-3 times daily. Perform this exercise against resistance later as your knee gets better.  Javier Docker and Hamstring Sets - Tighten up the muscle on the front of the thigh (Quad) and hold for 5-10 sec. Repeat this 10-20 times hourly. Hamstring sets are done by pushing the foot backward against an object and holding for 5-10 sec. Repeat as with quad sets.   Leg Slides: Lying on your back, slowly slide your foot toward your buttocks, bending your knee up off the floor (only go as far as is comfortable). Then slowly slide your foot back down until your leg is flat on the floor again.  Angel Wings: Lying on your back spread your legs to the side as far apart as you can without causing discomfort.  A rehabilitation program following serious knee injuries can speed recovery and prevent re-injury in the future due to weakened muscles. Contact your doctor or a physical therapist for more information on knee rehabilitation.   IF YOU ARE TRANSFERRED TO A SKILLED REHAB FACILITY If the patient is transferred to a skilled rehab facility  following release from the hospital, a list of the current medications will be sent to the facility for the patient to continue.  When discharged from the skilled rehab facility, please have the facility set up the patient's Gary prior to being released. Also, the skilled  facility will be responsible for providing the patient with their medications at time of release from the facility to include their pain medication, the muscle relaxants, and their blood thinner medication. If the patient is still at the rehab facility at time of the two week follow up appointment, the skilled rehab facility will also need to assist the patient in arranging follow up appointment in our office and any transportation needs.  MAKE SURE YOU:  . Understand these instructions.  . Get help right away if you are not doing well or get worse.   DENTAL ANTIBIOTICS:  In most cases prophylactic antibiotics for Dental procdeures after total joint surgery are not necessary.  Exceptions are as follows:  1. History of prior total joint infection  2. Severely immunocompromised (Organ Transplant, cancer chemotherapy, Rheumatoid biologic meds such as [place])  3. Poorly controlled diabetes (A1C &gt; 8.0, blood glucose over 200)  If you have one of these conditions, contact your surgeon for an antibiotic prescription, prior to your dental procedure.    Pick up stool softner and laxative for home use following surgery while on pain medications. Do not submerge incision under water. Please use good hand washing techniques while changing dressing each day. May shower starting three days after surgery. Please use a clean towel to pat the incision dry following showers. Continue to use ice for pain and swelling after surgery. Do not use any lotions or creams on the incision until instructed by your surgeon.

## 2020-04-24 NOTE — Progress Notes (Signed)
Assisted Dr. Hollis with left, ultrasound guided, adductor canal block. Side rails up, monitors on throughout procedure. See vital signs in flow sheet. Tolerated Procedure well.  

## 2020-04-24 NOTE — Progress Notes (Signed)
Orthopedic Tech Progress Note Patient Details:  Jane Chavez [birth year] [id number]  CPM Left Knee CPM Left Knee: On Left Knee Flexion (Degrees): 40 Left Knee Extension (Degrees): 10  Post Interventions Patient Tolerated: Well Instructions Provided: Care of device  [name] 04/24/2020, 4:14 PM

## 2020-04-24 NOTE — Progress Notes (Signed)
Orthopedic Tech Progress Note Patient Details:  Jane Chavez 01-14-1946 [id number] Patient already has knee immobilizer. Patient ID: Jane Chavez, female   DOB: 15-May-1946, 74 y.o.   MRN: [id number]   Braulio Bosch 04/24/2020, 4:48 PM

## 2020-04-24 NOTE — Op Note (Signed)
OPERATIVE REPORT-TOTAL KNEE ARTHROPLASTY   Pre-operative diagnosis- Osteoarthritis  Left knee(s)  Post-operative diagnosis- Osteoarthritis Left knee(s)  Procedure-  Left  Total Knee Arthroplasty  Surgeon- Dione Plover. Kamarah Bilotta, MD  Assistant- Theresa Duty, PA-C   Anesthesia-  Adductor canal block and spinal  EBL- 25 ml   Drains None  Tourniquet time-  Total Tourniquet Time Documented: Thigh (Left) - 31 minutes Total: Thigh (Left) - 31 minutes     Complications- None  Condition-PACU - hemodynamically stable.   Brief Clinical Note  Keslyn Teater is a 74 y.o. year old female with end stage OA of her left knee with progressively worsening pain and dysfunction. She has constant pain, with activity and at rest and significant functional deficits with difficulties even with ADLs. She has had extensive non-op management including analgesics, injections of cortisone and viscosupplements, and home exercise program, but remains in significant pain with significant dysfunction. Radiographs show bone on bone arthritis medial and patellofemoral. She presents now for left Total Knee Arthroplasty.    Procedure in detail---   The patient is brought into the operating room and positioned supine on the operating table. After successful administration of  Adductor canal block and spinal,   a tourniquet is placed high on the  Left thigh(s) and the lower extremity is prepped and draped in the usual sterile fashion. Time out is performed by the operating team and then the  Left lower extremity is wrapped in Esmarch, knee flexed and the tourniquet inflated to 300 mmHg.       A midline incision is made with a ten blade through the subcutaneous tissue to the level of the extensor mechanism. A fresh blade is used to make a medial parapatellar arthrotomy. Soft tissue over the proximal medial tibia is subperiosteally elevated to the joint line with a knife and into the semimembranosus bursa with a Cobb  elevator. Soft tissue over the proximal lateral tibia is elevated with attention being paid to avoiding the patellar tendon on the tibial tubercle. The patella is everted, knee flexed 90 degrees and the ACL and PCL are removed. Findings are bone on bone all 3 compartments with massive global osteophytes.        The drill is used to create a starting hole in the distal femur and the canal is thoroughly irrigated with sterile saline to remove the fatty contents. The 5 degree Left  valgus alignment guide is placed into the femoral canal and the distal femoral cutting block is pinned to remove 9 mm off the distal femur. Resection is made with an oscillating saw.      The tibia is subluxed forward and the menisci are removed. The extramedullary alignment guide is placed referencing proximally at the medial aspect of the tibial tubercle and distally along the second metatarsal axis and tibial crest. The block is pinned to remove 11mm off the more deficient medial  side. Resection is made with an oscillating saw. Size 3is the most appropriate size for the tibia and the proximal tibia is prepared with the modular drill and keel punch for that size.      The femoral sizing guide is placed and size 4 is most appropriate. Rotation is marked off the epicondylar axis and confirmed by creating a rectangular flexion gap at 90 degrees. The size 4 cutting block is pinned in this rotation and the anterior, posterior and chamfer cuts are made with the oscillating saw. The intercondylar block is then placed and that cut is made.  Trial size 3 tibial component, trial size 4 posterior stabilized femur and a 8  mm posterior stabilized rotating platform insert trial is placed. Full extension is achieved with excellent varus/valgus and anterior/posterior balance throughout full range of motion. The patella is everted and thickness measured to be 21  mm. Free hand resection is taken to 12 mm, a 32 template is placed, lug holes are  drilled, trial patella is placed, and it tracks normally. Osteophytes are removed off the posterior femur with the trial in place. All trials are removed and the cut bone surfaces prepared with pulsatile lavage. Cement is mixed and once ready for implantation, the size 3 tibial implant, size  4 posterior stabilized femoral component, and the size 32 patella are cemented in place and the patella is held with the clamp. The trial insert is placed and the knee held in full extension. The Exparel (20 ml mixed with 60 ml saline) is injected into the extensor mechanism, posterior capsule, medial and lateral gutters and subcutaneous tissues.  All extruded cement is removed and once the cement is hard the permanent 8 mm posterior stabilized rotating platform insert is placed into the tibial tray.      The wound is copiously irrigated with saline solution and the extensor mechanism closed with # 0 Stratofix suture. The tourniquet is released for a total tourniquet time of 31  minutes. Flexion against gravity is 140 degrees and the patella tracks normally. Subcutaneous tissue is closed with 2.0 vicryl and subcuticular with running 4.0 Monocryl. The incision is cleaned and dried and steri-strips and a bulky sterile dressing are applied. The limb is placed into a knee immobilizer and the patient is awakened and transported to recovery in stable condition.      Please note that a surgical assistant was a medical necessity for this procedure in order to perform it in a safe and expeditious manner. Surgical assistant was necessary to retract the ligaments and vital neurovascular structures to prevent injury to them and also necessary for proper positioning of the limb to allow for anatomic placement of the prosthesis.   Dione Plover Bennette Hasty, MD    04/24/2020, 12:35 PM

## 2020-04-24 NOTE — Plan of Care (Signed)

## 2020-04-24 NOTE — Anesthesia Postprocedure Evaluation (Signed)
Anesthesia Post Note  Patient: [name]  Procedure(s) Performed: TOTAL KNEE ARTHROPLASTY (Left Knee)     Patient location during evaluation: PACU Anesthesia Type: Spinal Level of consciousness: oriented and awake and alert Pain management: pain level controlled Vital Signs Assessment: post-procedure vital signs reviewed and stable Respiratory status: spontaneous breathing, respiratory function stable and patient connected to nasal cannula oxygen Cardiovascular status: blood pressure returned to baseline and stable Postop Assessment: no headache, no backache and no apparent nausea or vomiting Anesthetic complications: no   No complications documented.  Last Vitals:  Vitals:   04/24/20 1430 04/24/20 1613  BP: 107/72 130/72  Pulse: 73 81  Resp: (!) 23 15  Temp:  36.7 C  SpO2: 100% 100%    Last Pain:  Vitals:   04/24/20 1613  TempSrc:   PainSc: 0-No pain                 Effie Berkshire

## 2020-04-24 NOTE — Anesthesia Procedure Notes (Signed)
Anesthesia Regional Block: Adductor canal block   Pre-Anesthetic Checklist: ,, timeout performed, Correct Patient, Correct Site, Correct Laterality, Correct Procedure, Correct Position, site marked, Risks and benefits discussed,  Surgical consent,  Pre-op evaluation,  At surgeon's request and post-op pain management  Laterality: Left  Prep: chloraprep       Needles:  Injection technique: Single-shot  Needle Type: Echogenic Stimulator Needle     Needle Length: 9cm  Needle Gauge: 21     Additional Needles:   Procedures:,,,, ultrasound used (permanent image in chart),,,,  Narrative:  Start time: 04/24/2020 9:55 AM End time: 04/24/2020 10:00 AM Injection made incrementally with aspirations every 5 mL.  Performed by: Personally  Anesthesiologist: Effie Berkshire, MD  Additional Notes: Patient tolerated the procedure well. Local anesthetic introduced in an incremental fashion under minimal resistance after negative aspirations. No paresthesias were elicited. After completion of the procedure, no acute issues were identified and patient continued to be monitored by RN.

## 2020-04-24 NOTE — Anesthesia Preprocedure Evaluation (Addendum)
Anesthesia Evaluation  Patient identified by MRN, date of birth, ID band Patient awake    Reviewed: Allergy & Precautions, NPO status , Patient's Chart, lab work & pertinent test results  Airway Mallampati: II  TM Distance: >3 FB Neck ROM: Full    Dental  (+) Teeth Intact, Dental Advisory Given   Pulmonary neg pulmonary ROS,    breath sounds clear to auscultation       Cardiovascular negative cardio ROS   Rhythm:Regular Rate:Normal     Neuro/Psych negative neurological ROS  negative psych ROS   GI/Hepatic Neg liver ROS, GERD  Medicated,  Endo/Other  negative endocrine ROS  Renal/GU negative Renal ROS     Musculoskeletal  (+) Arthritis ,   Abdominal Normal abdominal exam  (+)   Peds  Hematology negative hematology ROS (+)   Anesthesia Other Findings   Reproductive/Obstetrics                            Anesthesia Physical Anesthesia Plan  ASA: II  Anesthesia Plan: Spinal   Post-op Pain Management:  Regional for Post-op pain   Induction: Intravenous  PONV Risk Score and Plan: 3 and Ondansetron, Dexamethasone and Propofol infusion  Airway Management Planned: Natural Airway and Simple Face Mask  Additional Equipment: None  Intra-op Plan:   Post-operative Plan: Extubation in OR  Informed Consent: I have reviewed the patients History and Physical, chart, labs and discussed the procedure including the risks, benefits and alternatives for the proposed anesthesia with the patient or authorized representative who has indicated his/her understanding and acceptance.     Dental advisory given  Plan Discussed with: CRNA  Anesthesia Plan Comments: (Lab Results      Component                Value               Date                      WBC                      8.1                 04/18/2020                HGB                      14.7                04/18/2020                HCT                       46.9 (H)            04/18/2020                MCV                      92.9                04/18/2020                PLT                      157  04/18/2020           )       Anesthesia Quick Evaluation

## 2020-04-24 NOTE — Plan of Care (Signed)
Plan of care reviewed and discussed with the patient. 

## 2020-04-24 NOTE — Progress Notes (Signed)
Orthopedic Tech Progress Note Patient Details:  Jane Chavez 05-Mar-1946 [id number]  CPM Left Knee CPM Left Knee: Off Left Knee Flexion (Degrees): 40 Left Knee Extension (Degrees): 10  Post Interventions Patient Tolerated: Well Instructions Provided: Care of device  [name] 04/24/2020, 6:10 PM

## 2020-04-24 NOTE — Transfer of Care (Signed)
Immediate Anesthesia Transfer of Care Note  Patient: Jane Chavez  Procedure(s) Performed: TOTAL KNEE ARTHROPLASTY (Left Knee)  Patient Location: PACU  Anesthesia Type:Spinal  Level of Consciousness: awake, alert  and oriented  Airway & Oxygen Therapy: Patient Spontanous Breathing and Patient connected to face mask oxygen  Post-op Assessment: Report given to RN and Post -op Vital signs reviewed and stable  Post vital signs: Reviewed and stable  Last Vitals:  Vitals Value Taken Time  BP 97/55 04/24/20 1302  Temp    Pulse 69 04/24/20 1306  Resp 17 04/24/20 1306  SpO2 98 % 04/24/20 1306  Vitals shown include unvalidated device data.  Last Pain:  Vitals:   04/24/20 1003  TempSrc:   PainSc: 0-No pain      Patients Stated Pain Goal: 4 (47/07/61 5183)  Complications: No complications documented.

## 2020-04-24 NOTE — Interval H&P Note (Signed)
History and Physical Interval Note:  04/24/2020 9:23 AM  Jane Chavez  has presented today for surgery, with the diagnosis of Left knee osteoarthritis.  The various methods of treatment have been discussed with the patient and family. After consideration of risks, benefits and other options for treatment, the patient has consented to  Procedure(s) with comments: TOTAL KNEE ARTHROPLASTY (Left) - 13min as a surgical intervention.  The patient's history has been reviewed, patient examined, no change in status, stable for surgery.  I have reviewed the patient's chart and labs.  Questions were answered to the patient's satisfaction.     Pilar Plate Jeramie Scogin

## 2020-04-24 NOTE — Care Plan (Signed)
Ortho Bundle Case Management Note  Patient Details  Name: Jane Chavez MRN: [id number] Date of Birth: 10-Apr-1946  L TKA on 04-24-20 DCP:  Home with son and daughter.  1 story home with 0 ste. DME:  RW and 3-in-1 ordered through [facility] PT:  [facility].  PT eval scheduled on 04-27-20.                   DME Arranged:  Walker rolling, 3-N-1 DME Agency:  [facility]  HH Arranged:  NA HH Agency:  NA  Additional Comments: Please contact me with any questions of if this plan should need to change.  Marianne Sofia, RN,CCM [facility]  [phone] 04/24/2020, 3:21 PM

## 2020-04-25 DIAGNOSIS — Z96651 Presence of right artificial knee joint: Secondary | ICD-10-CM | POA: Diagnosis present

## 2020-04-25 DIAGNOSIS — Z79899 Other long term (current) drug therapy: Secondary | ICD-10-CM | POA: Diagnosis not present

## 2020-04-25 DIAGNOSIS — Z825 Family history of asthma and other chronic lower respiratory diseases: Secondary | ICD-10-CM | POA: Diagnosis not present

## 2020-04-25 DIAGNOSIS — M1712 Unilateral primary osteoarthritis, left knee: Secondary | ICD-10-CM | POA: Diagnosis present

## 2020-04-25 DIAGNOSIS — Z7952 Long term (current) use of systemic steroids: Secondary | ICD-10-CM | POA: Diagnosis not present

## 2020-04-25 DIAGNOSIS — M329 Systemic lupus erythematosus, unspecified: Secondary | ICD-10-CM | POA: Diagnosis present

## 2020-04-25 DIAGNOSIS — K219 Gastro-esophageal reflux disease without esophagitis: Secondary | ICD-10-CM | POA: Diagnosis present

## 2020-04-25 LAB — CBC
HCT: 38.8 % (ref 36.0–46.0)
Hemoglobin: 12.6 g/dL (ref 12.0–15.0)
MCH: 29.1 pg (ref 26.0–34.0)
MCHC: 32.5 g/dL (ref 30.0–36.0)
MCV: 89.6 fL (ref 80.0–100.0)
Platelets: 121 10*3/uL — ABNORMAL LOW (ref 150–400)
RBC: 4.33 MIL/uL (ref 3.87–5.11)
RDW: 15.2 % (ref 11.5–15.5)
WBC: 15 10*3/uL — ABNORMAL HIGH (ref 4.0–10.5)
nRBC: 0 % (ref 0.0–0.2)

## 2020-04-25 LAB — BASIC METABOLIC PANEL
Anion gap: 6 (ref 5–15)
BUN: 15 mg/dL (ref 8–23)
CO2: 23 mmol/L (ref 22–32)
Calcium: 8.3 mg/dL — ABNORMAL LOW (ref 8.9–10.3)
Chloride: 107 mmol/L (ref 98–111)
Creatinine, Ser: 0.82 mg/dL (ref 0.44–1.00)
GFR, Estimated: >60 mL/min (ref >60)
Glucose, Bld: 127 mg/dL — ABNORMAL HIGH (ref 70–99)
Potassium: 4.3 mmol/L (ref 3.5–5.1)
Sodium: 136 mmol/L (ref 135–145)

## 2020-04-25 MED ORDER — METHOCARBAMOL 500 MG PO TABS: 500.0000 mg | ORAL_TABLET | Freq: Four times a day (QID) | ORAL | 0 refills | Status: DC | PRN

## 2020-04-25 MED ORDER — GABAPENTIN 300 MG PO CAPS
ORAL_CAPSULE | ORAL | 0 refills | Status: DC
Start: 2020-04-25 — End: 2020-10-09

## 2020-04-25 MED ORDER — ASPIRIN 325 MG PO TBEC: 325.0000 mg | DELAYED_RELEASE_TABLET | Freq: Two times a day (BID) | ORAL | 0 refills | Status: AC

## 2020-04-25 MED ORDER — TRAMADOL HCL 50 MG PO TABS
50.0000 mg | ORAL_TABLET | Freq: Four times a day (QID) | ORAL | 0 refills | Status: DC | PRN
Start: 2020-04-25 — End: 2020-10-09

## 2020-04-25 MED ORDER — OXYCODONE HCL 5 MG PO TABS
5.0000 mg | ORAL_TABLET | Freq: Four times a day (QID) | ORAL | 0 refills | Status: DC | PRN
Start: 2020-04-25 — End: 2020-10-09

## 2020-04-25 NOTE — Progress Notes (Signed)
   04/25/20 1500  PT Visit Information  Last PT Received On 04/25/20 Pt progressing slowly. Reviewed TKA HEP, pt able to participate with exercises in bed however further activity deferred d/t pt report of continued dizziness and nausea. Not ready for d/c today from PT standpoint. Hopefully pt will progress well once medical issues resolve.  Assistance Needed +1  History of Present Illness s/p L TKA. PMH: R TKA  Subjective Data  Patient Stated Goal home when feeling better  Precautions  Precautions Knee;Fall  Required Braces or Orthoses Knee Immobilizer - Left  Knee Immobilizer - Left Discontinue once straight leg raise with < 10 degree lag  Restrictions  Weight Bearing Restrictions No  Pain Assessment  Pain Assessment 0-10  Pain Score 6  Pain Location left knee  Pain Descriptors / Indicators Aching;Sore;Grimacing;Guarding  Pain Intervention(s) Limited activity within patient's tolerance;Monitored during session;Repositioned;Premedicated before session  Cognition  Arousal/Alertness Awake/alert  Behavior During Therapy WFL for tasks assessed/performed  Overall Cognitive Status Within Functional Limits for tasks assessed  General Comments overall very sleepy but arouses, nauseous, just got up to [facility] with continued dizziness per pt report   Bed Mobility  General bed mobility comments  (deferred d/t nausea)  Total Joint Exercises  Ankle Circles/Pumps AROM;Both;10 reps  Quad Sets AROM;Both;10 reps  Heel Slides AAROM;Left;10 reps  Hip ABduction/ADduction AAROM;Left;10 reps  Straight Leg Raises AAROM;Left;10 reps;Limitations  Goniometric ROM ~5 to 60 degrees L knee flexion   Straight Leg Raises Limitations required rest break d/t pain after 5 reps  PT - End of Session  Activity Tolerance Patient limited by pain;Other (comment) (nausea )  Patient left in bed;with call bell/phone within reach;with bed alarm set   PT - Assessment/Plan  PT Plan Current plan remains appropriate  PT  Visit Diagnosis Difficulty in walking, not elsewhere classified (R26.2)  PT Frequency (ACUTE ONLY) 7X/week  Follow Up Recommendations Follow surgeon's recommendation for DC plan and follow-up therapies  PT equipment None recommended by PT  AM-PAC PT "6 Clicks" Mobility Outcome Measure (Version 2)  Help needed turning from your back to your side while in a flat bed without using bedrails? 3  Help needed moving from lying on your back to sitting on the side of a flat bed without using bedrails? 3  Help needed moving to and from a bed to a chair (including a wheelchair)? 3  Help needed standing up from a chair using your arms (e.g., wheelchair or bedside chair)? 3  Help needed to walk in hospital room? 2  Help needed climbing 3-5 steps with a railing?  2  6 Click Score 16  Consider Recommendation of Discharge To: Home with [facility]  PT Goal Progression  Progress towards PT goals Progressing toward goals (slowly d/t nausea and pain )  Acute Rehab PT Goals  PT Goal Formulation With patient  Time For Goal Achievement 05/02/20  Potential to Achieve Goals Good  PT Time Calculation  PT Start Time (ACUTE ONLY) 1456  PT Stop Time (ACUTE ONLY) 1506  PT Time Calculation (min) (ACUTE ONLY) 10 min  PT General Charges  $$ ACUTE PT VISIT 1 Visit  PT Treatments  $Therapeutic Exercise 8-22 mins

## 2020-04-25 NOTE — Progress Notes (Signed)
   Subjective: 1 Day Post-Op Procedure(s) (LRB): TOTAL KNEE ARTHROPLASTY (Left) Patient reports pain as mild.   Patient seen in rounds by Dr. Wynelle Link. Patient is well, and has had no acute complaints or problems. Patient reports no SOB, chest pain, or calf pain. No issues overnight. Foley catheter to be removed this morning. Will be seen by physical therapy today.   Objective: Vital signs in last 24 hours: Temp:  [97.7 F (36.5 C)-98.7 F (37.1 C)] 98 F (36.7 C) (11/16 0547) Pulse Rate:  [65-98] 65 (11/16 0547) Resp:  [15-23] 16 (11/16 0547) BP: (90-140)/(48-81) 111/68 (11/16 0547) SpO2:  [96 %-100 %] 96 % (11/16 0547) Weight:  [72.7 kg] 72.7 kg (11/15 0928)  Intake/Output from previous day:  Intake/Output Summary (Last 24 hours) at 04/25/2020 0714 Last data filed at 04/25/2020 0547 Gross per 24 hour  Intake 2822.92 ml  Output 1030 ml  Net 1792.92 ml     Intake/Output this shift: No intake/output data recorded.  Labs: Recent Labs    04/25/20 0316  HGB 12.6   Recent Labs    04/25/20 0316  WBC 15.0*  RBC 4.33  HCT 38.8  PLT 121*   Recent Labs    04/25/20 0316  NA 136  K 4.3  CL 107  CO2 23  BUN 15  CREATININE 0.82  GLUCOSE 127*  CALCIUM 8.3*   No results for input(s): LABPT, INR in the last 72 hours.  Exam: General - Patient is Alert, Appropriate and Oriented Extremity - Neurologically intact Neurovascular intact Sensation intact distally Dorsiflexion/Plantar flexion intact Dressing - dressing C/D/I Motor Function - intact, moving foot and toes well on exam.   Past Medical History:  Diagnosis Date  . Back stiffness 03/13/2017   Pt.'s Dr. thinks it is Lupus related.  . Diverticulosis   . GERD (gastroesophageal reflux disease)   . History of colon polyps   . HLD (hyperlipidemia)   . Lupus ([place]) 2014  . Pleural effusion 09/2015    Assessment/Plan: 1 Day Post-Op Procedure(s) (LRB): TOTAL KNEE ARTHROPLASTY (Left) Principal Problem:   OA  (osteoarthritis) of knee Active Problems:   Primary osteoarthritis of left knee  Estimated body mass index is 30.27 kg/m as calculated from the following:   Height as of this encounter: 5\' 1"  (1.549 m).   Weight as of this encounter: 72.7 kg. Advance diet Up with therapy D/C IV fluids   Patient's anticipated LOS is less than 2 midnights, meeting these requirements: - Younger than 76 - Lives within 1 hour of care - Has a competent adult at home to recover with post-op recover - NO history of  - Chronic pain requiring opiods  - Diabetes  - Coronary Artery Disease  - Heart failure  - Heart attack  - Stroke  - DVT/VTE  - Cardiac arrhythmia  - Respiratory Failure/COPD  - Renal failure  - Anemia  - Advanced Liver disease       DVT Prophylaxis - Aspirin Weight bearing as tolerated. Continue therapy.  Plan is to go Home after hospital stay.  Plan for discharge later today if patient progresses with physical therapy and is meeting her goals.  Patient scheduled for outpatient physical therapy beginning tomorrow, 04/26/20.  Patient to follow up in office in two weeks.   The PDMP database was reviewed today prior to any opioid medications being prescribed to this patient.   Theresa Duty, PA-C Orthopedic Surgery [phone] 04/25/2020, 7:14 AM

## 2020-04-25 NOTE — Evaluation (Signed)
Physical Therapy Evaluation Patient Details Name: Jane Chavez MRN: [id number] DOB: Sep 30, 1945 Today's Date: 04/25/2020   History of Present Illness  s/p L TKA. PMH: R TKA  Clinical Impression  Pt is s/p TKA resulting in the deficits listed below (see PT Problem List).  Pt limited by dizziness in sitting and standing. BP 138/82, HR 74, SpO2 98%. Possibly d/t meds. RN aware.  Pt will benefit from skilled PT to increase their independence and safety with mobility to allow discharge to the venue listed below.      Follow Up Recommendations Follow surgeon's recommendation for DC plan and follow-up therapies    Equipment Recommendations  None recommended by PT (delivered)    Recommendations for Other Services       Precautions / Restrictions Precautions Precautions: Knee;Fall Restrictions Weight Bearing Restrictions: No      Mobility  Bed Mobility Overal bed mobility: Needs Assistance Bed Mobility: Supine to Sit     Supine to sit: Min assist     General bed mobility comments: incr time, assist with LLE    Transfers Overall transfer level: Needs assistance Equipment used: Rolling walker (2 wheeled) Transfers: Sit to/from [facility] Sit to Stand: Min assist         General transfer comment: assist to rise and transition to RW, cues for hand placement. assist throughout for balance with stand pivot. pt dizzy BP 138/82  Ambulation/Gait                Stairs            Wheelchair Mobility    Modified Rankin (Stroke Patients Only)       Balance Overall balance assessment: Needs assistance   Sitting balance-Leahy Scale: Fair       Standing balance-Leahy Scale: Poor                               Pertinent Vitals/Pain Pain Assessment: 0-10 Pain Score: 5  Pain Location: left knee Pain Descriptors / Indicators: Aching;Sore;Grimacing Pain Intervention(s): Limited activity within patient's tolerance;Monitored  during session;Premedicated before session;Repositioned    Home Living Family/patient expects to be discharged to:: Private residence Living Arrangements: Other relatives (grandson) Available Help at Discharge: Available PRN/intermittently Type of Home: House Home Access: Level entry     Home Layout: One level Home Equipment: Environmental consultant - 2 wheels;Bedside commode;Cane - single point      Prior Function Level of Independence: Independent;Independent with assistive device(s)         Comments: amb with cane prior to surgery     Hand Dominance        Extremity/Trunk Assessment   Upper Extremity Assessment Upper Extremity Assessment: Overall WFL for tasks assessed    Lower Extremity Assessment Lower Extremity Assessment: LLE deficits/detail LLE Deficits / Details: ankle df limited, knee extension and hip flexion 2/5, limited by pain       Communication   Communication: No difficulties  Cognition Arousal/Alertness: Awake/alert Behavior During Therapy: WFL for tasks assessed/performed Overall Cognitive Status: Within Functional Limits for tasks assessed                                        General Comments      Exercises     Assessment/Plan    PT Assessment Patient needs continued PT services  PT Problem List  Decreased strength;Decreased mobility;Decreased range of motion;Decreased activity tolerance;Decreased balance;Decreased knowledge of use of DME;Pain       PT Treatment Interventions DME instruction;Therapeutic exercise;Gait training;Functional mobility training;Therapeutic activities;Patient/family education    PT Goals (Current goals can be found in the Care Plan section)  Acute Rehab PT Goals Patient Stated Goal: home when feeling better PT Goal Formulation: With patient Time For Goal Achievement: 05/02/20 Potential to Achieve Goals: Good    Frequency 7X/week   Barriers to discharge        Co-evaluation                AM-PAC PT "6 Clicks" Mobility  Outcome Measure Help needed turning from your back to your side while in a flat bed without using bedrails?: A Little Help needed moving from lying on your back to sitting on the side of a flat bed without using bedrails?: A Little Help needed moving to and from a bed to a chair (including a wheelchair)?: A Little Help needed standing up from a chair using your arms (e.g., wheelchair or bedside chair)?: A Little Help needed to walk in hospital room?: A Lot Help needed climbing 3-5 steps with a railing? : Total 6 Click Score: 15    End of Session Equipment Utilized During Treatment: Gait belt Activity Tolerance: Patient tolerated treatment well Patient left: with call bell/phone within reach;in chair;with chair alarm set   PT Visit Diagnosis: Difficulty in walking, not elsewhere classified (R26.2)    Time: 4715-9539 PT Time Calculation (min) (ACUTE ONLY): 21 min   Charges:   PT Evaluation $PT Eval Low Complexity: [place], PT  Acute Rehab Dept ([place]) [phone] Pager [phone]  04/25/2020   [facility] 04/25/2020, 12:53 PM

## 2020-04-25 NOTE — Progress Notes (Signed)
Pt has had slower progression with physical therapy, is not yet meeting goals to be cleared for discharge. Will stay overnight and continue working with therapy tomorrow.   Amie Cowens, PA-C Orthopedic Surgery [facility] [place]   

## 2020-04-25 NOTE — Plan of Care (Signed)

## 2020-04-25 NOTE — TOC Transition Note (Signed)
Transition of Care [facility]) - CM/SW Discharge Note   Patient Details  Name: Jane Chavez MRN: [id number] Date of Birth: [birth year]  [facility] of [facility]) CM/SW Contact:  Lennart Pall, LCSW Phone Number: 04/25/2020, 11:44 AM   Clinical Narrative:    Met briefly with pt and confirming she has received her rw and 3n1 from [facility].  Plan for OPPT at [facility].  No TOC needs.   Final next level of care: OP Rehab Barriers to Discharge: No Barriers Identified   Patient Goals and CMS Choice Patient states their goals for this hospitalization and ongoing recovery are:: go home today      Discharge Placement                       Discharge Plan and Services                DME Arranged: Walker rolling, 3-N-1 DME Agency: [employer]       HH Arranged: NA HH Agency: NA        Social Determinants of Health (SDOH) Interventions     Readmission Risk Interventions No flowsheet data found.

## 2020-04-26 ENCOUNTER — Encounter (HOSPITAL_COMMUNITY): Payer: Self-pay | Admitting: Orthopedic Surgery

## 2020-04-26 LAB — CBC
HCT: 40.1 % (ref 36.0–46.0)
Hemoglobin: 13 g/dL (ref 12.0–15.0)
MCH: 29.4 pg (ref 26.0–34.0)
MCHC: 32.4 g/dL (ref 30.0–36.0)
MCV: 90.7 fL (ref 80.0–100.0)
Platelets: 123 10*3/uL — ABNORMAL LOW (ref 150–400)
RBC: 4.42 MIL/uL (ref 3.87–5.11)
RDW: 15.4 % (ref 11.5–15.5)
WBC: 13.9 10*3/uL — ABNORMAL HIGH (ref 4.0–10.5)
nRBC: 0 % (ref 0.0–0.2)

## 2020-04-26 LAB — BASIC METABOLIC PANEL
Anion gap: 7 (ref 5–15)
BUN: 15 mg/dL (ref 8–23)
CO2: 27 mmol/L (ref 22–32)
Calcium: 8 mg/dL — ABNORMAL LOW (ref 8.9–10.3)
Chloride: 102 mmol/L (ref 98–111)
Creatinine, Ser: 1 mg/dL (ref 0.44–1.00)
GFR, Estimated: 60 mL/min — ABNORMAL LOW (ref >60)
Glucose, Bld: 109 mg/dL — ABNORMAL HIGH (ref 70–99)
Potassium: 4.6 mmol/L (ref 3.5–5.1)
Sodium: 136 mmol/L (ref 135–145)

## 2020-04-26 NOTE — Progress Notes (Signed)
   Subjective: 2 Days Post-Op Procedure(s) (LRB): TOTAL KNEE ARTHROPLASTY (Left) Patient reports pain as mild.   Patient seen in rounds by Dr. Wynelle Link. Patient is well, and has had no acute complaints or problems. No bowel movement but is passing gas. Patient seen by physical therapy yesterday afternoon and was unable to complete exercises out of the bed due to dizziness and nausea. Patient denies dizziness and nausea this morning.  Patient denies chest pain, SOB, or calf pain. Patient to be seen by physical therapy again this morning.  Plan is to go Home after hospital stay.  Objective: Vital signs in last 24 hours: Temp:  [97.7 F (36.5 C)-98.6 F (37 C)] 98.2 F (36.8 C) (11/17 0544) Pulse Rate:  [68-95] 82 (11/17 0544) Resp:  [14-18] 16 (11/17 0544) BP: (122-141)/(67-85) 122/74 (11/17 0544) SpO2:  [94 %-99 %] 94 % (11/17 0544)  Intake/Output from previous day:  Intake/Output Summary (Last 24 hours) at 04/26/2020 0805 Last data filed at 04/26/2020 0544 Gross per 24 hour  Intake 1099.41 ml  Output 2000 ml  Net -900.59 ml    Intake/Output this shift: No intake/output data recorded.  Labs: Recent Labs    04/25/20 0316 04/26/20 0322  HGB 12.6 13.0   Recent Labs    04/25/20 0316 04/26/20 0322  WBC 15.0* 13.9*  RBC 4.33 4.42  HCT 38.8 40.1  PLT 121* 123*   Recent Labs    04/25/20 0316 04/26/20 0322  NA 136 136  K 4.3 4.6  CL 107 102  CO2 23 27  BUN 15 15  CREATININE 0.82 1.00  GLUCOSE 127* 109*  CALCIUM 8.3* 8.0*   No results for input(s): LABPT, INR in the last 72 hours.  Exam: General - Patient is Alert and Oriented Extremity - Neurologically intact Neurovascular intact Sensation intact distally Dorsiflexion/Plantar flexion intact Dressing/Incision - clean, dry, no drainage. Removed ACE wrap and gauze. Aquacell in place. Motor Function - intact, moving foot and toes well on exam.   Past Medical History:  Diagnosis Date  . Back stiffness  03/13/2017   Pt.'s Dr. thinks it is Lupus related.  . Diverticulosis   . GERD (gastroesophageal reflux disease)   . History of colon polyps   . HLD (hyperlipidemia)   . Lupus ([place]) 2014  . Pleural effusion 09/2015    Assessment/Plan: 2 Days Post-Op Procedure(s) (LRB): TOTAL KNEE ARTHROPLASTY (Left) Principal Problem:   OA (osteoarthritis) of knee Active Problems:   Primary osteoarthritis of left knee  Estimated body mass index is 30.27 kg/m as calculated from the following:   Height as of this encounter: 5\' 1"  (1.549 m).   Weight as of this encounter: 72.7 kg. Up with therapy  DVT Prophylaxis - Aspirin Weight-bearing as tolerated.  Will plan for discharge today if patient progresses with physical therapy and is meeting her goals. Patient scheduled for outpatient physical therapy beginning 04/27/20. Patient to follow up in office in two weeks.   Theresa Duty, PA-C Orthopedic Surgery [facility] [place]

## 2020-04-26 NOTE — Plan of Care (Signed)
Problem: Education: Goal: Knowledge of General Education information will improve Description: Including pain rating scale, medication(s)/side effects and non-pharmacologic comfort measures 04/26/2020 1228 by Deetta Perla, RN Outcome: Adequate for Discharge 04/26/2020 1227 by Deetta Perla, RN Outcome: Progressing   Problem: Health Behavior/Discharge Planning: Goal: Ability to manage health-related needs will improve 04/26/2020 1228 by Marven Veley, Helane Gunther, RN Outcome: Adequate for Discharge 04/26/2020 1227 by Deetta Perla, RN Outcome: Progressing   Problem: Clinical Measurements: Goal: Ability to maintain clinical measurements within normal limits will improve 04/26/2020 1228 by Deetta Perla, RN Outcome: Adequate for Discharge 04/26/2020 1227 by Deetta Perla, RN Outcome: Progressing Goal: Will remain free from infection 04/26/2020 1228 by Deetta Perla, RN Outcome: Adequate for Discharge 04/26/2020 1227 by Deetta Perla, RN Outcome: Progressing Goal: Diagnostic test results will improve 04/26/2020 1228 by Deetta Perla, RN Outcome: Adequate for Discharge 04/26/2020 1227 by Deetta Perla, RN Outcome: Progressing Goal: Respiratory complications will improve 04/26/2020 1228 by Deetta Perla, RN Outcome: Adequate for Discharge 04/26/2020 1227 by Deetta Perla, RN Outcome: Progressing Goal: Cardiovascular complication will be avoided 04/26/2020 1228 by Deetta Perla, RN Outcome: Adequate for Discharge 04/26/2020 1227 by Deetta Perla, RN Outcome: Progressing   Problem: Activity: Goal: Risk for activity intolerance will decrease 04/26/2020 1228 by Deetta Perla, RN Outcome: Adequate for Discharge 04/26/2020 1227 by Deetta Perla, RN Outcome: Progressing   Problem: Coping: Goal: Level of anxiety will decrease 04/26/2020 1228 by Deetta Perla, RN Outcome: Adequate for Discharge 04/26/2020 1227 by  Deetta Perla, RN Outcome: Progressing   Problem: Elimination: Goal: Will not experience complications related to bowel motility 04/26/2020 1228 by Deetta Perla, RN Outcome: Adequate for Discharge 04/26/2020 1227 by Deetta Perla, RN Outcome: Progressing   Problem: Pain Managment: Goal: General experience of comfort will improve 04/26/2020 1228 by Deetta Perla, RN Outcome: Adequate for Discharge 04/26/2020 1227 by Deetta Perla, RN Outcome: Progressing   Problem: Safety: Goal: Ability to remain free from injury will improve 04/26/2020 1228 by Renise Gillies, Helane Gunther, RN Outcome: Adequate for Discharge 04/26/2020 1227 by Deetta Perla, RN Outcome: Progressing   Problem: Skin Integrity: Goal: Risk for impaired skin integrity will decrease 04/26/2020 1228 by Deetta Perla, RN Outcome: Adequate for Discharge 04/26/2020 1227 by Deetta Perla, RN Outcome: Progressing   Problem: Education: Goal: Knowledge of the prescribed therapeutic regimen will improve 04/26/2020 1228 by Deetta Perla, RN Outcome: Adequate for Discharge 04/26/2020 1227 by Deetta Perla, RN Outcome: Progressing   Problem: Activity: Goal: Ability to avoid complications of mobility impairment will improve 04/26/2020 1228 by Cindy Fullman, Helane Gunther, RN Outcome: Adequate for Discharge 04/26/2020 1227 by Deetta Perla, RN Outcome: Progressing Goal: Range of joint motion will improve 04/26/2020 1228 by Janissa Bertram, Helane Gunther, RN Outcome: Adequate for Discharge 04/26/2020 1227 by Deetta Perla, RN Outcome: Progressing   Problem: Clinical Measurements: Goal: Postoperative complications will be avoided or minimized 04/26/2020 1228 by Deetta Perla, RN Outcome: Adequate for Discharge 04/26/2020 1227 by Deetta Perla, RN Outcome: Progressing   Problem: Pain Management: Goal: Pain level will decrease with appropriate interventions 04/26/2020 1228 by Deetta Perla,  RN Outcome: Adequate for Discharge 04/26/2020 1227 by Deetta Perla, RN Outcome: Progressing   Problem: Skin Integrity: Goal: Will show signs of wound healing 04/26/2020 1228 by Deetta Perla, RN Outcome: Adequate for Discharge 04/26/2020 1227 by Deetta Perla, RN Outcome: Progressing

## 2020-04-26 NOTE — Progress Notes (Signed)
Physical Therapy Treatment Patient Details Name: Jane Chavez MRN: [id number] DOB: [birth year] Today's Date: 04/26/2020    History of Present Illness s/p L TKA. PMH: R TKA    PT Comments    Pt reports feeling better today.  Pt ambulated in hallway and performed LE exercises.  Pt reviewed HEP and had no further questions.  Pt feels ready for d/c home today.    Follow Up Recommendations  Follow surgeon's recommendation for DC plan and follow-up therapies     Equipment Recommendations  None recommended by PT    Recommendations for Other Services       Precautions / Restrictions Precautions Precautions: Knee;Fall Required Braces or Orthoses: Knee Immobilizer - Left Knee Immobilizer - Left: Discontinue once straight leg raise with < 10 degree lag    Mobility  Bed Mobility Overal bed mobility: Needs Assistance Bed Mobility: Supine to Sit     Supine to sit: Supervision        Transfers Overall transfer level: Needs assistance Equipment used: Rolling walker (2 wheeled) Transfers: Sit to/from Stand Sit to Stand: Min guard         General transfer comment: verbal cues for UE and LE positioning  Ambulation/Gait Ambulation/Gait assistance: Min guard Gait Distance (Feet): 80 Feet Assistive device: Rolling walker (2 wheeled) Gait Pattern/deviations: Step-to pattern;Decreased weight shift to left;Antalgic;Decreased stance time - left Gait velocity: decr   General Gait Details: verbal cues for sequence, RW positioning, step length; pt ambulated to tolerance (which is distance from bedroom to kitchen in her house)   Stairs             Wheelchair Mobility    Modified Rankin (Stroke Patients Only)       Balance                                            Cognition Arousal/Alertness: Awake/alert Behavior During Therapy: WFL for tasks assessed/performed Overall Cognitive Status: Within Functional Limits for tasks assessed                                         Exercises Total Joint Exercises Ankle Circles/Pumps: AROM;Both;10 reps Quad Sets: AROM;Both;10 reps Heel Slides: AAROM;Left;10 reps Hip ABduction/ADduction: AAROM;Left;10 reps Straight Leg Raises: AAROM;Left;10 reps    General Comments        Pertinent Vitals/Pain Pain Assessment: 0-10 Pain Score: 4  Pain Location: left knee Pain Descriptors / Indicators: Aching;Sore Pain Intervention(s): Repositioned;Monitored during session;Premedicated before session    Home Living                      Prior Function            PT Goals (current goals can now be found in the care plan section) Progress towards PT goals: Progressing toward goals    Frequency    7X/week      PT Plan Current plan remains appropriate    Co-evaluation              AM-PAC PT "6 Clicks" Mobility   Outcome Measure  Help needed turning from your back to your side while in a flat bed without using bedrails?: A Little Help needed moving from lying on your back to sitting on the side of a  flat bed without using bedrails?: A Little Help needed moving to and from a bed to a chair (including a wheelchair)?: A Little Help needed standing up from a chair using your arms (e.g., wheelchair or bedside chair)?: A Little Help needed to walk in hospital room?: A Little Help needed climbing 3-5 steps with a railing? : A Little 6 Click Score: 18    End of Session Equipment Utilized During Treatment: Gait belt Activity Tolerance: Patient tolerated treatment well Patient left: with call bell/phone within reach;in bed Nurse Communication: Mobility status PT Visit Diagnosis: Difficulty in walking, not elsewhere classified (R26.2)     Time: 5379-4327 PT Time Calculation (min) (ACUTE ONLY): 24 min  Charges:  $Gait Training: 8-22 mins $Therapeutic Exercise: 8-22 mins                    Jannette Spanner PT, DPT Acute Rehabilitation Services Pager:  [phone] Office: [phone]  York Ram E 04/26/2020, 1:06 PM

## 2020-04-26 NOTE — Plan of Care (Signed)
Plan of care reviewed and discussed with the patient. 

## 2020-04-27 DIAGNOSIS — M25562 Pain in left knee: Secondary | ICD-10-CM | POA: Diagnosis not present

## 2020-04-27 NOTE — Discharge Summary (Signed)
Physician Discharge Summary   Patient ID: Jane Chavez MRN: [id number] DOB/AGE: December 17, 1945 74 y.o.  Admit date: 04/24/2020 Discharge date: 04/26/2020  Primary Diagnosis: Osteoarthritis, left knee   Admission Diagnoses:  Past Medical History:  Diagnosis Date  . Back stiffness 03/13/2017   Pt.'s Dr. thinks it is Lupus related.  . Diverticulosis   . GERD (gastroesophageal reflux disease)   . History of colon polyps   . HLD (hyperlipidemia)   . Lupus ([place]) 2014  . Pleural effusion 09/2015   Discharge Diagnoses:   Principal Problem:   OA (osteoarthritis) of knee Active Problems:   Primary osteoarthritis of left knee  Estimated body mass index is 30.27 kg/m as calculated from the following:   Height as of this encounter: 5\' 1"  (1.549 m).   Weight as of this encounter: 72.7 kg.  Procedure:  Procedure(s) (LRB): TOTAL KNEE ARTHROPLASTY (Left)   Consults: None  HPI: Jane Chavez is a 74 y.o. year old female with end stage OA of her left knee with progressively worsening pain and dysfunction. She has constant pain, with activity and at rest and significant functional deficits with difficulties even with ADLs. She has had extensive non-op management including analgesics, injections of cortisone and viscosupplements, and home exercise program, but remains in significant pain with significant dysfunction. Radiographs show bone on bone arthritis medial and patellofemoral. She presents now for left Total Knee Arthroplasty.    Laboratory Data: Admission on 04/24/2020, Discharged on 04/26/2020  Component Date Value Ref Range Status  . ABO/RH(D) 04/24/2020    Final                   Value:A POS Performed at [facility], [place] [address] [place], [place] [address]   . WBC 04/25/2020 15.0* 4.0 - 10.5 K/uL Final  . RBC 04/25/2020 4.33  3.87 - 5.11 MIL/uL Final  . Hemoglobin 04/25/2020 12.6  12.0 - 15.0 g/dL Final  . HCT 04/25/2020 38.8  36 - 46 % Final  . MCV  04/25/2020 89.6  80.0 - 100.0 fL Final  . MCH 04/25/2020 29.1  26.0 - 34.0 pg Final  . MCHC 04/25/2020 32.5  30.0 - 36.0 g/dL Final  . RDW 04/25/2020 15.2  11.5 - 15.5 % Final  . Platelets 04/25/2020 121* 150 - 400 K/uL Final   Comment: REPEATED TO VERIFY Immature Platelet Fraction may be clinically indicated, consider ordering this additional test [id number]   . nRBC 04/25/2020 0.0  0.0 - 0.2 % Final   Performed at [place] [address] [place], [place] [address]  . Sodium 04/25/2020 136  135 - 145 mmol/L Final  . Potassium 04/25/2020 4.3  3.5 - 5.1 mmol/L Final  . Chloride 04/25/2020 107  98 - 111 mmol/L Final  . CO2 04/25/2020 23  22 - 32 mmol/L Final  . Glucose, Bld 04/25/2020 127* 70 - 99 mg/dL Final   Glucose reference range applies only to samples taken after fasting for at least 8 hours.  . BUN 04/25/2020 15  8 - 23 mg/dL Final  . Creatinine, Ser 04/25/2020 0.82  0.44 - 1.00 mg/dL Final  . Calcium 04/25/2020 8.3* 8.9 - 10.3 mg/dL Final  . GFR, Estimated 04/25/2020 >60  >60 mL/min Final   Comment: (NOTE) Calculated using the CKD-EPI Creatinine Equation (2021)   . Anion gap 04/25/2020 6  5 - 15 Final   Performed at [facility], [place] [address] [place], [place] [address]  . WBC  04/26/2020 13.9* 4.0 - 10.5 K/uL Final  . RBC 04/26/2020 4.42  3.87 - 5.11 MIL/uL Final  . Hemoglobin 04/26/2020 13.0  12.0 - 15.0 g/dL Final  . HCT 04/26/2020 40.1  36 - 46 % Final  . MCV 04/26/2020 90.7  80.0 - 100.0 fL Final  . MCH 04/26/2020 29.4  26.0 - 34.0 pg Final  . MCHC 04/26/2020 32.4  30.0 - 36.0 g/dL Final  . RDW 04/26/2020 15.4  11.5 - 15.5 % Final  . Platelets 04/26/2020 123* 150 - 400 K/uL Final  . nRBC 04/26/2020 0.0  0.0 - 0.2 % Final   Performed at [facility], [place] [address] [place], [place] [address]  . Sodium 04/26/2020 136  135 - 145 mmol/L Final  . Potassium 04/26/2020 4.6  3.5 - 5.1 mmol/L Final  . Chloride  04/26/2020 102  98 - 111 mmol/L Final  . CO2 04/26/2020 27  22 - 32 mmol/L Final  . Glucose, Bld 04/26/2020 109* 70 - 99 mg/dL Final   Glucose reference range applies only to samples taken after fasting for at least 8 hours.  . BUN 04/26/2020 15  8 - 23 mg/dL Final  . Creatinine, Ser 04/26/2020 1.00  0.44 - 1.00 mg/dL Final  . Calcium 04/26/2020 8.0* 8.9 - 10.3 mg/dL Final  . GFR, Estimated 04/26/2020 60* >60 mL/min Final   Comment: (NOTE) Calculated using the CKD-EPI Creatinine Equation (2021)   . Anion gap 04/26/2020 7  5 - 15 Final   Performed at [facility], [place] [address] [place], [place] [address]  Hospital Outpatient Visit on 04/20/2020  Component Date Value Ref Range Status  . SARS Coronavirus 2 04/20/2020 NEGATIVE  NEGATIVE Final   Comment: (NOTE) SARS-CoV-2 target nucleic acids are NOT DETECTED.  The SARS-CoV-2 RNA is generally detectable in upper and lower respiratory specimens during the acute phase of infection. Negative results do not preclude SARS-CoV-2 infection, do not rule out co-infections with other pathogens, and should not be used as the sole basis for treatment or other patient management decisions. Negative results must be combined with clinical observations, patient history, and epidemiological information. The expected result is Negative.  Fact Sheet for Patients: SugarRoll.be  Fact Sheet for Healthcare Providers: https://www.woods-mathews.com/  This test is not yet approved or cleared by the [place] FDA and  has been authorized for detection and/or diagnosis of SARS-CoV-2 by FDA under an Emergency Use Authorization (EUA). This EUA will remain  in effect (meaning this test can be used) for the duration of the COVID-19 declaration under Se                          ction 564(b)(1) of the Act, 21 U.S.C. section 360bbb-3(b)(1), unless the authorization is terminated or revoked  sooner.  Performed at [facility], [place] [address] [place], [place] [address]   Hospital Outpatient Visit on 04/18/2020  Component Date Value Ref Range Status  . aPTT 04/18/2020 25  24 - 36 seconds Final   Performed at [facility], [place] [address], [place], [place] [address]  . WBC 04/18/2020 8.1  4.0 - 10.5 K/uL Final   WHITE COUNT CONFIRMED ON SMEAR  . RBC 04/18/2020 5.05  3.87 - 5.11 MIL/uL Final  . Hemoglobin 04/18/2020 14.7  12.0 - 15.0 g/dL Final  . HCT 04/18/2020 46.9* 36 - 46 % Final  . MCV 04/18/2020 92.9  80.0 - 100.0 fL  Final  . MCH 04/18/2020 29.1  26.0 - 34.0 pg Final  . MCHC 04/18/2020 31.3  30.0 - 36.0 g/dL Final  . RDW 04/18/2020 15.0  11.5 - 15.5 % Final  . Platelets 04/18/2020 157  150 - 400 K/uL Final  . nRBC 04/18/2020 0.0  0.0 - 0.2 % Final   Performed at [facility], [place] [address], [place], [place] [address]  . Sodium 04/18/2020 140  135 - 145 mmol/L Final  . Potassium 04/18/2020 3.5  3.5 - 5.1 mmol/L Final  . Chloride 04/18/2020 107  98 - 111 mmol/L Final  . CO2 04/18/2020 27  22 - 32 mmol/L Final  . Glucose, Bld 04/18/2020 82  70 - 99 mg/dL Final   Glucose reference range applies only to samples taken after fasting for at least 8 hours.  . BUN 04/18/2020 11  8 - 23 mg/dL Final  . Creatinine, Ser 04/18/2020 0.80  0.44 - 1.00 mg/dL Final  . Calcium 04/18/2020 8.9  8.9 - 10.3 mg/dL Final  . Total Protein 04/18/2020 7.3  6.5 - 8.1 g/dL Final  . Albumin 04/18/2020 3.4* 3.5 - 5.0 g/dL Final  . AST 04/18/2020 17  15 - 41 U/L Final  . ALT 04/18/2020 12  0 - 44 U/L Final  . Alkaline Phosphatase 04/18/2020 54  38 - 126 U/L Final  . Total Bilirubin 04/18/2020 0.7  0.3 - 1.2 mg/dL Final  . GFR, Estimated 04/18/2020 >60  >60 mL/min Final   Comment: (NOTE) Calculated using the CKD-EPI Creatinine Equation (2021)   . Anion gap 04/18/2020 6  5 - 15 Final   Performed at [facility], [place] [address] [place], [place] [address]  . Prothrombin Time 04/18/2020 12.1  11.4 - 15.2 seconds Final  . INR 04/18/2020 0.9  0.8 - 1.2 Final   Comment: (NOTE) INR goal varies based on device and disease states. Performed at [facility], [place] [address] [place], [place] [address]   . ABO/RH(D) 04/18/2020 A POS   Final  . Antibody Screen 04/18/2020 NEG   Final  . Sample Expiration 04/18/2020 04/27/2020,2359   Final  . Extend sample reason 04/18/2020    Final                   Value:NO TRANSFUSIONS OR PREGNANCY IN THE PAST 3 MONTHS Performed at [facility], [place] [address] [place], [place] [address]   . MRSA, PCR 04/18/2020 NEGATIVE  NEGATIVE Final  . Staphylococcus aureus 04/18/2020 NEGATIVE  NEGATIVE Final   Comment: (NOTE) The Xpert SA Assay (FDA approved for NASAL specimens in patients 67 years of age and older), is one component of a comprehensive surveillance program. It is not intended to diagnose infection nor to guide or monitor treatment. Performed at [facility], [place] [address], [place], [place] [address]      X-Rays:No results found.  EKG: Orders placed or performed during the hospital encounter of 10/03/15  . EKG 12-Lead  . EKG 12-Lead  . ED EKG  . ED EKG  . EKG     Hospital Course: Jane Chavez is a 74 y.o. who was admitted to [facility]. They were brought to the operating room on 04/24/2020 and underwent Procedure(s): TOTAL KNEE ARTHROPLASTY.  Patient tolerated the procedure well and was later transferred to the recovery room and then to the orthopaedic floor for postoperative care. They were given PO and IV analgesics for  pain control following their surgery. They were given 24 hours of postoperative antibiotics of  Anti-infectives (From admission, onward)   Start     Dose/Rate Route Frequency Ordered Stop   04/24/20 2200  vancomycin (VANCOCIN) IVPB 1000 mg/200 mL premix        1,000 mg 200 mL/hr over  60 Minutes Intravenous Every 12 hours 04/24/20 1601 04/24/20 2229   04/24/20 0915  vancomycin (VANCOCIN) IVPB 1000 mg/200 mL premix        1,000 mg 200 mL/hr over 60 Minutes Intravenous On call to O.R. 04/24/20 1937 04/24/20 1138     and started on DVT prophylaxis in the form of Aspirin.   PT and OT were ordered for total joint protocol. Discharge planning consulted to help with postop disposition and equipment needs. Patient had a decent night on the evening of surgery. They started to get up OOB with therapy on POD #0. Continued to work with therapy into POD #2. Pt was seen during rounds on day two and was ready to go home pending progress with therapy. Dressing was changed and the incision was clean, dry, and intact. Pt worked with therapy for two additional sessions and was meeting their goals. She was discharged to home later that day in stable condition.  Diet: Regular diet Activity: WBAT Follow-up: in 2 weeks Disposition: Home with outpatient physical therapy Discharged Condition: stable   Discharge Instructions    Call MD / Call 911   Complete by: As directed    If you experience chest pain or shortness of breath, CALL 911 and be transported to the hospital emergency room.  If you develope a fever above 101 F, pus (white drainage) or increased drainage or redness at the wound, or calf pain, call your surgeon's office.   Change dressing   Complete by: As directed    You may remove the bulky bandage (ACE wrap and gauze) two days after surgery. You will have an adhesive waterproof bandage underneath. Leave this in place until your first follow-up appointment.   Constipation Prevention   Complete by: As directed    Drink plenty of fluids.  Prune juice may be helpful.  You may use a stool softener, such as Colace (over the counter) 100 mg twice a day.  Use MiraLax (over the counter) for constipation as needed.   Diet - low sodium heart healthy   Complete by: As directed    Do not put a  pillow under the knee. Place it under the heel.   Complete by: As directed    Driving restrictions   Complete by: As directed    No driving for two weeks   TED hose   Complete by: As directed    Use stockings (TED hose) for three weeks on both leg(s).  You may remove them at night for sleeping.   Weight bearing as tolerated   Complete by: As directed      Allergies as of 04/26/2020      Reactions   Penicillins Shortness Of Breath   Has patient had a PCN reaction causing immediate rash, facial/tongue/throat swelling, SOB or lightheadedness with hypotension: Yes Has patient had a PCN reaction causing severe rash involving mucus membranes or skin necrosis: No Has patient had a PCN reaction that required hospitalization No Has patient had a PCN reaction occurring within the last 10 years: No If all of the above answers are "NO", then may proceed with Cephalosporin use.      Medication List  TAKE these medications   aspirin 325 MG EC tablet Take 1 tablet (325 mg total) by mouth 2 (two) times daily for 20 days. Then take one 81 mg aspirin once a day for three weeks. Then discontinue aspirin.   azaTHIOprine 50 MG tablet Commonly known as: IMURAN Take 100 mg by mouth daily.   carboxymethylcellul-glycerin 0.5-0.9 % ophthalmic solution Commonly known as: REFRESH OPTIVE Place 1 drop into both eyes 2 (two) times daily as needed for dry eyes.   cholecalciferol 25 MCG (1000 UNIT) tablet Commonly known as: VITAMIN D3 Take 1,000 Units by mouth daily.   famotidine 20 MG tablet Commonly known as: PEPCID TAKE 1-2 TABLETS (20-40 MG TOTAL) BY MOUTH DAILY. What changed:   when to take this  reasons to take this   furosemide 80 MG tablet Commonly known as: LASIX Take 80 mg by mouth daily.   gabapentin 300 MG capsule Commonly known as: NEURONTIN Take a 300 mg capsule three times a day for two weeks following surgery.Then take a 300 mg capsule two times a day for two weeks. Then take  a 300 mg capsule once a day for two weeks. Then discontinue.   hydroxychloroquine 200 MG tablet Commonly known as: PLAQUENIL Take 1 tablet (200 mg total) by mouth daily.   methocarbamol 500 MG tablet Commonly known as: ROBAXIN Take 1 tablet (500 mg total) by mouth every 6 (six) hours as needed for muscle spasms.   oxyCODONE 5 MG immediate release tablet Commonly known as: Oxy IR/ROXICODONE Take 1-2 tablets (5-10 mg total) by mouth every 6 (six) hours as needed for severe pain.   potassium chloride SA 20 MEQ tablet Commonly known as: KLOR-CON Take 20 mEq by mouth daily.   predniSONE 5 MG tablet Commonly known as: DELTASONE Take 5 mg by mouth daily with breakfast.   traMADol 50 MG tablet Commonly known as: ULTRAM Take 1-2 tablets (50-100 mg total) by mouth every 6 (six) hours as needed for moderate pain.   vitamin B-12 1000 MCG tablet Commonly known as: CYANOCOBALAMIN Take 1,000 mcg by mouth daily.            Discharge Care Instructions  (From admission, onward)         Start     Ordered   04/25/20 0000  Weight bearing as tolerated        04/25/20 0731   04/25/20 0000  Change dressing       Comments: You may remove the bulky bandage (ACE wrap and gauze) two days after surgery. You will have an adhesive waterproof bandage underneath. Leave this in place until your first follow-up appointment.   04/25/20 0731          Follow-up Information    Gaynelle Arabian, MD. Schedule an appointment as soon as possible for a visit on 05/09/2020.   Specialty: Orthopedic Surgery Why: You are scheduled for a post-operative appointment on 05-09-20 at 2:00 pm.  Contact information: [address] [place] [address] [phone]        Rosilyn Mings.. Go on 04/27/2020.   Why: You are scheduled for a physical therapy appointment on 04-27-20 at 10:00 am.  Contact information: [place] [address] [phone]                Signed: Theresa Duty, PA-C Orthopedic Surgery 04/27/2020, 7:38 AM

## 2020-05-03 DIAGNOSIS — M25562 Pain in left knee: Secondary | ICD-10-CM | POA: Diagnosis not present

## 2020-05-08 DIAGNOSIS — M25562 Pain in left knee: Secondary | ICD-10-CM | POA: Diagnosis not present

## 2020-05-12 DIAGNOSIS — M25562 Pain in left knee: Secondary | ICD-10-CM | POA: Diagnosis not present

## 2020-05-15 DIAGNOSIS — M25562 Pain in left knee: Secondary | ICD-10-CM | POA: Diagnosis not present

## 2020-05-19 DIAGNOSIS — M25562 Pain in left knee: Secondary | ICD-10-CM | POA: Diagnosis not present

## 2020-05-25 DIAGNOSIS — M25562 Pain in left knee: Secondary | ICD-10-CM | POA: Diagnosis not present

## 2020-05-30 DIAGNOSIS — Z96652 Presence of left artificial knee joint: Secondary | ICD-10-CM | POA: Diagnosis not present

## 2020-05-30 DIAGNOSIS — M25562 Pain in left knee: Secondary | ICD-10-CM | POA: Diagnosis not present

## 2020-06-01 DIAGNOSIS — M25562 Pain in left knee: Secondary | ICD-10-CM | POA: Diagnosis not present

## 2020-06-29 DIAGNOSIS — Z20822 Contact with and (suspected) exposure to covid-19: Secondary | ICD-10-CM | POA: Diagnosis not present

## 2020-06-29 DIAGNOSIS — M8580 Other specified disorders of bone density and structure, unspecified site: Secondary | ICD-10-CM | POA: Diagnosis not present

## 2020-06-29 DIAGNOSIS — R519 Headache, unspecified: Secondary | ICD-10-CM | POA: Diagnosis not present

## 2020-06-29 DIAGNOSIS — Z79899 Other long term (current) drug therapy: Secondary | ICD-10-CM | POA: Diagnosis not present

## 2020-06-29 DIAGNOSIS — M329 Systemic lupus erythematosus, unspecified: Secondary | ICD-10-CM | POA: Diagnosis not present

## 2020-06-29 DIAGNOSIS — M1990 Unspecified osteoarthritis, unspecified site: Secondary | ICD-10-CM | POA: Diagnosis not present

## 2020-06-29 DIAGNOSIS — E559 Vitamin D deficiency, unspecified: Secondary | ICD-10-CM | POA: Diagnosis not present

## 2020-07-11 DIAGNOSIS — M25562 Pain in left knee: Secondary | ICD-10-CM | POA: Diagnosis not present

## 2020-07-14 DIAGNOSIS — M25562 Pain in left knee: Secondary | ICD-10-CM | POA: Diagnosis not present

## 2020-07-18 DIAGNOSIS — M25562 Pain in left knee: Secondary | ICD-10-CM | POA: Diagnosis not present

## 2020-07-21 DIAGNOSIS — M25562 Pain in left knee: Secondary | ICD-10-CM | POA: Diagnosis not present

## 2020-07-26 DIAGNOSIS — M25562 Pain in left knee: Secondary | ICD-10-CM | POA: Diagnosis not present

## 2020-07-28 DIAGNOSIS — M25562 Pain in left knee: Secondary | ICD-10-CM | POA: Diagnosis not present

## 2020-08-02 DIAGNOSIS — M25562 Pain in left knee: Secondary | ICD-10-CM | POA: Diagnosis not present

## 2020-08-04 DIAGNOSIS — M25562 Pain in left knee: Secondary | ICD-10-CM | POA: Diagnosis not present

## 2020-08-11 DIAGNOSIS — M25562 Pain in left knee: Secondary | ICD-10-CM | POA: Diagnosis not present

## 2020-08-14 DIAGNOSIS — M25562 Pain in left knee: Secondary | ICD-10-CM | POA: Diagnosis not present

## 2020-08-21 DIAGNOSIS — M25562 Pain in left knee: Secondary | ICD-10-CM | POA: Diagnosis not present

## 2020-08-24 DIAGNOSIS — M25562 Pain in left knee: Secondary | ICD-10-CM | POA: Diagnosis not present

## 2020-08-28 DIAGNOSIS — M25562 Pain in left knee: Secondary | ICD-10-CM | POA: Diagnosis not present

## 2020-09-07 ENCOUNTER — Telehealth: Payer: Self-pay

## 2020-09-07 DIAGNOSIS — K824 Cholesterolosis of gallbladder: Secondary | ICD-10-CM

## 2020-09-07 DIAGNOSIS — R1011 Right upper quadrant pain: Secondary | ICD-10-CM

## 2020-09-07 NOTE — Telephone Encounter (Signed)
-----   Message from Roetta Sessions, [place] sent at 10/06/2019  3:47 PM EDT ----- Regarding: U/S due 09-2020 Pt due for U/S - RUQ. Gallbladder polyp

## 2020-09-07 NOTE — Telephone Encounter (Signed)
Patient is due for RUQ ultrasound to monitor gallbladder polyp.  Placed order and sent MyChart message to patient to expect a call from [facility] scheduling to schedule U/S. Message sent to scheduling to call patient to schedule U/S

## 2020-09-26 ENCOUNTER — Ambulatory Visit (INDEPENDENT_AMBULATORY_CARE_PROVIDER_SITE_OTHER): Payer: Medicare Other | Admitting: Internal Medicine

## 2020-09-26 ENCOUNTER — Other Ambulatory Visit: Payer: Self-pay

## 2020-09-26 ENCOUNTER — Ambulatory Visit: Payer: Medicare Other

## 2020-09-26 ENCOUNTER — Encounter: Payer: Self-pay | Admitting: Internal Medicine

## 2020-09-26 VITALS — BP 122/74 | HR 76 | Temp 97.0°F | Ht 62.0 in | Wt 159.6 lb

## 2020-09-26 DIAGNOSIS — M329 Systemic lupus erythematosus, unspecified: Secondary | ICD-10-CM

## 2020-09-26 DIAGNOSIS — R0789 Other chest pain: Secondary | ICD-10-CM | POA: Diagnosis not present

## 2020-09-26 DIAGNOSIS — Z8709 Personal history of other diseases of the respiratory system: Secondary | ICD-10-CM

## 2020-09-26 NOTE — Patient Instructions (Signed)
ICD-10-CM   1. History of systemic lupus erythematosus (SLE) ([place])  M32.9   2. History of pleural effusion  Z87.09   3. Chest pain, atypical  R07.89    Pain sounds very musculoskeletal and not lung related  Plan  cxr 2 view Echo  Also discuss with PCP and rheumatology  Followup  - telephone visit with app in few weeks to discuss results

## 2020-09-26 NOTE — Progress Notes (Signed)
Subjective:     Patient ID: Jane Chavez, female   DOB: 12-09-1945, 75 y.o.   MRN: [id number]  HPI  Chief Complaint  Patient presents with  . Follow-up    Referring provider: [name], MD  HPI: 75 year old female never smoker with lupus seen for pulmonary consult during hospitalization 2017 for bilateral effusions. Patient has grade 2 diastolic congestive heart failure  Jane Chavez is a 75 y.o. female with Lupus off medical therapies >5 years. Now on prednisone and Plaquenil, grade II diastolic congestive heart failure, thrombocytopenia, and history of bilateral pleural effusions.   10/23/2015 Hospital Follow Up: Patient presents to the office for hospital follow-up of bilateral pleural effusions which were treated with bilateral thoracentesis.She had been off treatment for her Lupus for >5 years, is now on prednisone, lasix  and Plaquenil. She denies shortness of breath and states she is feeling much better.Denies chest pain, SOB, fever, leg or calf pain.She denies cough. She has noted swelling in her lower extremities. She is compliant with lasix treatment, prednisone, Plaquenil and Lipitor. She has followed up with Dr. Dossie Der, rheumatology, and is following up with her PCP next week. She is currently on 80 mg of prednisone daily which is being managed by Dr. Dossie Der..  Significant Events/Procedures:  Admit date: 10/03/2015 Discharge date: 10/05/2015  Principal Problem:  Bilateral pleural effusion (Recurrent) Active Problems:  Acute respiratory failure with hypoxia (HCC)  Left ventricular diatolic dysfunction, NYHA class 2  SLE (systemic lupus erythematosus) (HCC)  Recurrent pleural effusion on left  Pleural effusion, bilateral  SOB (shortness of breath)  Procedures:  1. 4/25 left thoracentesis yielding 1.45 L clear yellow fluid 2. 4/26 right thoracentesis Yielded 1 liter of clear yellow fluid. 3. 09/22/2015: Echo: 65-70%  EF  10/23/2015: CXR: Small-to-moderate-sized bilateral pleural effusions slightly less than that seen on October 04 2015. There is no pneumothorax.   OV 10/16/2016  Chief Complaint  Patient presents with  . Follow-up    pt states breathing is ok but she does get a little winded here and there chest xray showed fluid on her lungs so Dr. Dorathy Daft told her to see her pulmonologist   75 year old African-American female who I'm seeing for the first time. Last seen in our office just over a year ago. Based on review of the chart and her history she tells me that she is living [place] and was diagnosed with lupus of the joints and skin rash over 10 years ago. Some 6 years ago she moved to [place]. She was already on chronic prednisone. Some 4 years ago she establish with [facility] rheumatology Department. Since then she's been on Plaquenil. Then approximately in April 2017 started developing bilateral pleural effusions but review of the chart shows transudate and nondiagnostic cytology on 3 occasions all in April 2017. She's been maintained on Lasix with potassium. This has kept her under control. Then she reports that earlier this year she saw her Medicare physician Dr. Ashby Dawes and chest x-ray suggested pleural effusion. Indicates approximately a month ago. The same thing is some pleural effusion. She only has mild dyspnea on extreme exertion. She's been referred back to pulmonary. I do not have the most recent chest x-ray from primary care physician's office. But I did  visualize all the 2017 films and I concur with the report. Other than that she feels well.   11/05/2016 Follow up : Pleural effusions Patient returns for a two-week follow-up. Patient was seen in the office for  evaluation of bilateral pleural effusions. Patient was seen in during hospitalization. April 2017 for bilateral pleural effusions. Patient underwent thoracentesis twice in April 2017. Was felt to be  transudate of effusions. Patient was felt this may be secondary to her lupus. And was referred to rheumatology. She is followed by rheumatology and is currently on prednisone 10 mg and Plaquenil.  Echo April 2017 showed normal EF and grade 2 diastolic dysfunction. Patient is on Lasix 80 mg daily .Seen by PCP last month told bnp was nml .  Last visit. Patient was set up for a thoracentesis or left pleural effusion with 330 cc of pleural fluid removed. It appeared to be transudate. Cytology was negative for malignant cells..  Ok Edwards 01/26/2018  Chief Complaint  Patient presents with  . Acute Visit    Pt has been having pain in lungs x2 weeks. Pt states the pain is mainly pressure and she is also having pain in her back. Pt is also having SOB and tightness in chest. Pt denies any cough.   75 year old female personally not seen in this practice for over a year. She is known to have lupus with diastolic dysfunctionand transudative left pleural effusion over a year ago. She follows with Dr. Dossie Der for her lupus which is treated with Imuran, prednisone and Plaquenil. She is here acutely because for the last few to several weeks she's had upper back suprascapular and infrascapular spasms with pain associated with deep inspiration but she also has palpable tenderness in this area. She also feels heavy in the central chest area and particularly feels dyspneic when she lies down. She also feels like she has to take a good deep breath in order to feelthat she is getting enough oxygen. Dyspnea is also worse when she exerts herself. She feels that her pleural effusion might be coming back she also feels that it might be similar to her pulmonary embolism that she suffered 20 years ago while in [place] [is a new history that we got today] for which she took Coumadin at that time. This no orthopnea or edema or paroxysmal nocturnal dyspnea  She had CT abdomen in December 2018 lung cutthat I personally visualized showed very  minimal pleural effusion. December 2018 creatinine was normal  OV 09/26/2020  Subjective:  Patient ID: Jane Chavez, female , DOB: 1946-01-06 , age 39 y.o. , MRN: [id number] , ADDRESS: [address] [place] [address] PCP [name], MD Patient Care Team: [name], MD as PCP - General (Rheumatology)  This Provider for this visit: Treatment Team:  Attending Provider: Brand Males, MD    09/26/2020 -   Chief Complaint  Patient presents with  . Follow-up    Chest and back pain/tightness for 2 weeks. Occ dry cough   Acute visit  HPI Jane Chavez 75 y.o. -female with history of lupus erythematosus on prednisone, Plaquenil and azathioprine according to history.  Sees Dr. Dossie Der in [facility] rheumatology.  Last visit with the rheumatologist was 3 months ago with normal lab work.  She has not been seen in this practice for 3 years almost.  She is here because for the last 2 weeks she has had nonspecific musculoskeletal pain in the center part of the chest left and right infra axillary area and also in the back.  This pain is made worse by twisting motions of the chest and the upper back.  It is not associated with exertion.  There is no associated shortness of breath or wheezing  orthopnea paroxysmal nocturnal dyspnea fever or chills.  She is really worried about her previous pleural effusion coming back.  She wants investigation for this she does not want blood work.    CT Chest data  No results found.    PFT  No flowsheet data found.     has a past medical history of Back stiffness (03/13/2017), Diverticulosis, GERD (gastroesophageal reflux disease), History of colon polyps, HLD (hyperlipidemia), Lupus ([place]) (Aug 29, 2012), and Pleural effusion (09/2015).   reports that she has never smoked. She has never used smokeless tobacco.  Past Surgical History:  Procedure Laterality Date  . BREAST LUMPECTOMY Right   . CATARACT EXTRACTION, BILATERAL    .  ESOPHAGOGASTRODUODENOSCOPY N/A 09/09/2017   Procedure: ESOPHAGOGASTRODUODENOSCOPY (EGD);  Surgeon: Yetta Flock, MD;  Location: [facility];  Service: Gastroenterology;  Laterality: N/A;  . REPLACEMENT TOTAL KNEE Right   . TOTAL KNEE ARTHROPLASTY Left 04/24/2020   Procedure: TOTAL KNEE ARTHROPLASTY;  Surgeon: Gaynelle Arabian, MD;  Location: [facility];  Service: Orthopedics;  Laterality: Left;  76min    Allergies  Allergen Reactions  . Penicillins Shortness Of Breath    Has patient had a PCN reaction causing immediate rash, facial/tongue/throat swelling, SOB or lightheadedness with hypotension: Yes Has patient had a PCN reaction causing severe rash involving mucus membranes or skin necrosis: No Has patient had a PCN reaction that required hospitalization No Has patient had a PCN reaction occurring within the last 10 years: No If all of the above answers are "NO", then may proceed with Cephalosporin use.     Immunization History  Administered Date(s) Administered  . Influenza, High Dose Seasonal PF 03/30/2015, 03/06/2016, 04/10/2017  . Influenza, Quadrivalent, Recombinant, Inj, Pf 03/20/2017, 02/24/2018, 03/04/2019, 03/03/2020  . Influenza,inj,Quad PF,6+ Mos 04/10/2016  . Influenza,inj,quad, With Preservative 03/10/2020  . Influenza-Unspecified 06/10/2018  . Moderna Sars-Covid-2 Vaccination 08/06/2019, 09/01/2019  . PFIZER(Purple Top)SARS-COV-2 Vaccination 08/06/2019, 09/01/2019  . PPD Test 04/05/2015  . Pneumococcal Conjugate-13 02/19/2013  . Pneumococcal Polysaccharide-23 01/23/2017  . Zoster Recombinat (Shingrix) 08/17/2019    Family History  Problem Relation Age of Onset  . Colon cancer Sister   . Other Brother        colon surgery, growth removed  . Colon cancer Other        niece, deceased 08-29-16  . Asthma Mother      Current Outpatient Medications:  .  azaTHIOprine (IMURAN) 50 MG tablet, Take 100 mg by mouth daily. , Disp: , Rfl: 2 .   carboxymethylcellul-glycerin (REFRESH OPTIVE) 0.5-0.9 % ophthalmic solution, Place 1 drop into both eyes 2 (two) times daily as needed for dry eyes., Disp: , Rfl:  .  cholecalciferol (VITAMIN D3) 25 MCG (1000 UNIT) tablet, Take 1,000 Units by mouth daily., Disp: , Rfl:  .  famotidine (PEPCID) 20 MG tablet, TAKE 1-2 TABLETS (20-40 MG TOTAL) BY MOUTH DAILY. (Patient taking differently: Take 20-40 mg by mouth daily as needed for heartburn or indigestion.), Disp: 180 tablet, Rfl: 1 .  furosemide (LASIX) 80 MG tablet, Take 80 mg by mouth daily. , Disp: , Rfl: 0 .  gabapentin (NEURONTIN) 300 MG capsule, Take a 300 mg capsule three times a day for two weeks following surgery.Then take a 300 mg capsule two times a day for two weeks. Then take a 300 mg capsule once a day for two weeks. Then discontinue., Disp: 84 capsule, Rfl: 0 .  hydroxychloroquine (PLAQUENIL) 200 MG tablet, Take 1 tablet (200 mg total) by mouth daily., Disp: 30  tablet, Rfl: 0 .  methocarbamol (ROBAXIN) 500 MG tablet, Take 1 tablet (500 mg total) by mouth every 6 (six) hours as needed for muscle spasms., Disp: 40 tablet, Rfl: 0 .  oxyCODONE (OXY IR/ROXICODONE) 5 MG immediate release tablet, Take 1-2 tablets (5-10 mg total) by mouth every 6 (six) hours as needed for severe pain., Disp: 42 tablet, Rfl: 0 .  potassium chloride SA (K-DUR,KLOR-CON) 20 MEQ tablet, Take 20 mEq by mouth daily., Disp: , Rfl: 0 .  predniSONE (DELTASONE) 5 MG tablet, Take 5 mg by mouth daily with breakfast. , Disp: , Rfl:  .  traMADol (ULTRAM) 50 MG tablet, Take 1-2 tablets (50-100 mg total) by mouth every 6 (six) hours as needed for moderate pain., Disp: 40 tablet, Rfl: 0 .  vitamin B-12 (CYANOCOBALAMIN) 1000 MCG tablet, Take 1,000 mcg by mouth daily., Disp: , Rfl:       Objective:   Vitals:   09/26/20 1139  BP: 122/74  Pulse: 76  Temp: (!) 97 F (36.1 C)  TempSrc: Oral  SpO2: 98%  Weight: 159 lb 9.6 oz (72.4 kg)  Height: 5\' 2"  (1.575 m)    Estimated  body mass index is 29.19 kg/m as calculated from the following:   Height as of this encounter: 5\' 2"  (1.575 m).   Weight as of this encounter: 159 lb 9.6 oz (72.4 kg).  @WEIGHTCHANGE @  [employer]   09/26/20 1139  Weight: 159 lb 9.6 oz (72.4 kg)     Physical Exam General: No distress. obese Neuro: Alert and Oriented x 3. GCS 15. Speech normal Psych: Pleasant Resp:  Barrel Chest - no.  Wheeze - no, Crackles - no, No overt respiratory distress CVS: Normal heart sounds. Murmurs - no Ext: Stigmata of Connective Tissue Disease - no HEENT: Normal upper airway. PEERL +. No post nasal drip        Assessment:       ICD-10-CM   1. History of systemic lupus erythematosus (SLE) ([place])  M32.9   2. History of pleural effusion  Z87.09   3. Chest pain, atypical  R07.89        Plan:     Patient Instructions     ICD-10-CM   1. History of systemic lupus erythematosus (SLE) ([place])  M32.9   2. History of pleural effusion  Z87.09   3. Chest pain, atypical  R07.89    Pain sounds very musculoskeletal and not lung related  Plan  cxr 2 view Echo  Also discuss with PCP and rheumatology  Followup  - telephone visit with app in few weeks to discuss results     SIGNATURE    Dr. Brand Males, M.D., F.C.C.P,  Pulmonary and Critical Care Medicine Staff Physician, [place] Director - Interstitial Lung Disease  Program  Pulmonary [place] [facility] [place], [place], [address]  Pager: [phone], If no answer or between  15:00h - 7:00h: call [phone] Telephone: [phone]  12:08 PM 09/26/2020

## 2020-09-26 NOTE — Addendum Note (Signed)
Addended byCoralie Keens on: 09/26/2020 12:16 PM   Modules accepted: Orders

## 2020-09-28 ENCOUNTER — Other Ambulatory Visit: Payer: Self-pay

## 2020-09-28 ENCOUNTER — Ambulatory Visit (HOSPITAL_COMMUNITY)
Admission: RE | Admit: 2020-09-28 | Discharge: 2020-09-28 | Disposition: A | Discharge status: Home / Self Care | Payer: Medicare Other | Source: Ambulatory Visit | Attending: Gastroenterology | Admitting: Gastroenterology

## 2020-09-28 DIAGNOSIS — K824 Cholesterolosis of gallbladder: Secondary | ICD-10-CM | POA: Insufficient documentation

## 2020-09-28 DIAGNOSIS — R1011 Right upper quadrant pain: Secondary | ICD-10-CM | POA: Diagnosis not present

## 2020-10-05 ENCOUNTER — Telehealth: Payer: Self-pay | Admitting: Gastroenterology

## 2020-10-05 NOTE — Telephone Encounter (Signed)
Spoke with patient, advised that Dr. Chase Caller at Pulmonology ordered the xray for her and she will need to contact them in regards to getting that appointment rescheduled. Advised patient that per her [place] result note she is due for routine follow up with Dr. Havery Moros. She has been scheduled for a follow up on Monday, 10/09/20 at 2:10 PM. Patient states that her PCP retired and will discuss the need for a referral at the time of her appointment. Patient verbalized understanding and had no concerns at the end of the call.

## 2020-10-05 NOTE — Telephone Encounter (Signed)
Patient calling to reschedule Xray appointment. Also wants to know if Dr. Havery Moros can verbally refer her to a PCP provider that he knows of.

## 2020-10-09 ENCOUNTER — Ambulatory Visit (INDEPENDENT_AMBULATORY_CARE_PROVIDER_SITE_OTHER): Payer: Medicare Other | Admitting: Gastroenterology

## 2020-10-09 ENCOUNTER — Encounter: Payer: Self-pay | Admitting: Gastroenterology

## 2020-10-09 VITALS — BP 116/64 | HR 90 | Ht 61.0 in | Wt 160.8 lb

## 2020-10-09 DIAGNOSIS — R1011 Right upper quadrant pain: Secondary | ICD-10-CM | POA: Diagnosis not present

## 2020-10-09 DIAGNOSIS — Z800 Family history of malignant neoplasm of digestive organs: Secondary | ICD-10-CM

## 2020-10-09 DIAGNOSIS — K219 Gastro-esophageal reflux disease without esophagitis: Secondary | ICD-10-CM | POA: Diagnosis not present

## 2020-10-09 DIAGNOSIS — D175 Benign lipomatous neoplasm of intra-abdominal organs: Secondary | ICD-10-CM | POA: Diagnosis not present

## 2020-10-09 DIAGNOSIS — K824 Cholesterolosis of gallbladder: Secondary | ICD-10-CM | POA: Diagnosis not present

## 2020-10-09 MED ORDER — FUROSEMIDE 80 MG PO TABS
80.0000 mg | ORAL_TABLET | Freq: Every day | ORAL | 0 refills | Status: DC
Start: 2020-10-09 — End: 2023-08-20

## 2020-10-09 MED ORDER — POTASSIUM CHLORIDE CRYS ER 20 MEQ PO TBCR
20.0000 meq | EXTENDED_RELEASE_TABLET | Freq: Every day | ORAL | 0 refills | Status: DC
Start: 2020-10-09 — End: 2020-11-01

## 2020-10-09 NOTE — Progress Notes (Signed)
HPI :  75 year old female here for a follow-up visit for right upper quadrant pain, history of gallbladder polyps. She has been seen in the past a few times for this.  Please see prior notes for full details of her case.  She has had prior ultrasounds of her right upper quadrant showing no gallstones but, small gallbladder polyps.  She has had CT scan for this in 2018 showing a gastric lipoma.  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  I last saw her about a year ago.  She states she continues to have right upper quadrant pain that bothers her and is more bothersome to her at this point time and it was the last time I saw her.  She has pain essentially there all the time but it never really goes away, worst it can be is rated 7 out of 10.  She denies any prandial association with this at all.  Eating does not make this worse or make it any better.  She denies any positional changes with this.  Tends to be tender most of the time.  She states sometimes she can have some back pain with this and wonders if it is related.  She has a history of reflux and takes Pepcid as needed, she is not really having much of any heartburn with this.  She avoids food triggers her symptoms can be minimized.  She denies any significant changes in her bowel habits, no blood in her stools.  She is becoming frustrated by the persistent pain.  Has a history of lupus, followed by rheumatology, on Imuran and Plaquenil for this.  She is also taking steroid tapers recently.  She is on Lasix 80 mg a day and potassium supplementation for fluid retention.  This is been managed by her primary care however she has switched primary care and has no one to refill this for her, she asked me for a refill of her Lasix today.   Prior workup: Colonoscopy 04/03/2017 - diverticulosis, 2 small polyps removed - one adenoma, recall 5 years for strong family history of colon cancer US  abdomen6/28/2016- normal gallbladder, no gallstones CT scan 06/06/2017 - suspected gastric lipoma, otherwise unremarkable exam EGD 09/09/2017 - 3cm HH, large lipoma of the antrum, biopsies negative for HP  RUQ [place] 10/06/19 - FINDINGS: Gallbladder: No gallstones or wall thickening visualized. A 4 mm non-mobile polyp is incidentally noted. No sonographic Murphy sign noted by sonographer. Common bile duct: Diameter: 4 mm, within normal limits. Liver: No focal lesion identified. Within normal limits in parenchymal echogenicity. Portal vein is patent on color Doppler imaging with normal direction of blood flow towards the liver. Other: None. IMPRESSION: No evidence of gallstones or other significant hepatobiliary abnormality   RUQ [place] 09/28/20: FINDINGS: Gallbladder: No gallstone, gallbladder wall thickening, or pericholecystic fluid. Negative sonographic Murphy's sign. There is a 5 mm gallbladder polyp. Common bile duct: Diameter: 5 mm Liver: No focal lesion identified. Within normal limits in parenchymal echogenicity. Portal vein is patent on color Doppler imaging with normal direction of blood flow towards the liver. Other: None. IMPRESSION: A 5 mm gallbladder polyp, otherwise unremarkable right upper quadrant ultrasound.     Past Medical History:  Diagnosis Date  . Back stiffness 03/13/2017   Pt.'s Dr. thinks it is Lupus related.  . Chronic abdominal pain   . Diverticulosis   . Gallbladder polyp   . GERD (gastroesophageal reflux disease)   .  History of colon polyps   . HLD (hyperlipidemia)   . Lipoma of stomach   . Lupus ([place]) 2014  . Pleural effusion 09/2015     Past Surgical History:  Procedure Laterality Date  . BREAST LUMPECTOMY Right   . CATARACT EXTRACTION, BILATERAL    . ESOPHAGOGASTRODUODENOSCOPY N/A 09/09/2017   Procedure: ESOPHAGOGASTRODUODENOSCOPY (EGD);  Surgeon: Yetta Flock, MD;  Location: [facility];  Service: Gastroenterology;   Laterality: N/A;  . REPLACEMENT TOTAL KNEE Right   . TOTAL KNEE ARTHROPLASTY Left 04/24/2020   Procedure: TOTAL KNEE ARTHROPLASTY;  Surgeon: Gaynelle Arabian, MD;  Location: [facility];  Service: Orthopedics;  Laterality: Left;  40min   Family History  Problem Relation Age of Onset  . Colon cancer Sister   . Other Brother        colon surgery, growth removed  . Colon cancer Other        niece, deceased 09/04/2016  . Asthma Mother    Social History   Tobacco Use  . Smoking status: Never Smoker  . Smokeless tobacco: Never Used  Vaping Use  . Vaping Use: Never used  Substance Use Topics  . Alcohol use: No  . Drug use: No   Current Outpatient Medications  Medication Sig Dispense Refill  . azaTHIOprine (IMURAN) 50 MG tablet Take 100 mg by mouth daily.   2  . carboxymethylcellul-glycerin (REFRESH OPTIVE) 0.5-0.9 % ophthalmic solution Place 1 drop into both eyes 2 (two) times daily as needed for dry eyes.    . cholecalciferol (VITAMIN D3) 25 MCG (1000 UNIT) tablet Take 1,000 Units by mouth daily.    . famotidine (PEPCID) 20 MG tablet TAKE 1-2 TABLETS (20-40 MG TOTAL) BY MOUTH DAILY. (Patient taking differently: Take 20-40 mg by mouth daily as needed for heartburn or indigestion.) 180 tablet 1  . hydroxychloroquine (PLAQUENIL) 200 MG tablet Take 1 tablet (200 mg total) by mouth daily. 30 tablet 0  . predniSONE (DELTASONE) 5 MG tablet Take 5 mg by mouth daily with breakfast. On 20mg  currently-tapering down to 5mg  once daily.    . vitamin B-12 (CYANOCOBALAMIN) 1000 MCG tablet Take 1,000 mcg by mouth daily.    . furosemide (LASIX) 80 MG tablet Take 1 tablet (80 mg total) by mouth daily. Please have PCP manage going forward. 30 tablet 0  . methocarbamol (ROBAXIN) 500 MG tablet Take 1 tablet (500 mg total) by mouth every 6 (six) hours as needed for muscle spasms. (Patient not taking: Reported on 10/09/2020) 40 tablet 0  . potassium chloride SA (KLOR-CON) 20 MEQ tablet Take 1 tablet (20 mEq total) by  mouth daily. Please have PCP manage going forward. 30 tablet 0   No current facility-administered medications for this visit.   Allergies  Allergen Reactions  . Penicillins Shortness Of Breath    Has patient had a PCN reaction causing immediate rash, facial/tongue/throat swelling, SOB or lightheadedness with hypotension: Yes Has patient had a PCN reaction causing severe rash involving mucus membranes or skin necrosis: No Has patient had a PCN reaction that required hospitalization No Has patient had a PCN reaction occurring within the last 10 years: No If all of the above answers are "NO", then may proceed with Cephalosporin use.      Review of Systems: All systems reviewed and negative except where noted in HPI.    US Abdomen Limited RUQ (LIVER/GB)  Result Date: 09/28/2020 CLINICAL DATA:  75 year old female with gallbladder polyp EXAM: ULTRASOUND ABDOMEN LIMITED RIGHT UPPER QUADRANT COMPARISON:  Right upper quadrant ultrasound dated 10/06/2019. FINDINGS: Gallbladder: No gallstone, gallbladder wall thickening, or pericholecystic fluid. Negative sonographic Murphy's sign. There is a 5 mm gallbladder polyp. Common bile duct: Diameter: 5 mm Liver: No focal lesion identified. Within normal limits in parenchymal echogenicity. Portal vein is patent on color Doppler imaging with normal direction of blood flow towards the liver. Other: None. IMPRESSION: A 5 mm gallbladder polyp, otherwise unremarkable right upper quadrant ultrasound. Electronically Signed   By: Anner Crete M.D.   On: 09/28/2020 20:31   Lab Results  Component Value Date   WBC 13.9 (H) 04/26/2020   HGB 13.0 04/26/2020   HCT 40.1 04/26/2020   MCV 90.7 04/26/2020   PLT 123 (L) 04/26/2020    Lab Results  Component Value Date   CREATININE 1.00 04/26/2020   BUN 15 04/26/2020   NA 136 04/26/2020   K 4.6 04/26/2020   CL 102 04/26/2020   CO2 27 04/26/2020    Lab Results  Component Value Date   ALT 12 04/18/2020   AST  17 04/18/2020   ALKPHOS 54 04/18/2020   BILITOT 0.7 04/18/2020      Physical Exam: BP 116/64   Pulse 90   Ht 5\' 1"  (1.549 m)   Wt 160 lb 12.8 oz (72.9 kg)   SpO2 99%   BMI 30.38 kg/m  Constitutional: Pleasant,well-developed, female in no acute distress. HEENT: Normocephalic and atraumatic. Conjunctivae are normal. No scleral icterus. Neck supple.  Cardiovascular: Normal rate, regular rhythm.  Pulmonary/chest: Effort normal and breath sounds normal. No wheezing, rales or rhonchi. Abdominal: Soft, nondistended, mild tenderness underneath costal margin on R side, hard to reproduce, There are no masses palpable.  Extremities: no edema Lymphadenopathy: No cervical adenopathy noted. Neurological: Alert and oriented to person place and time. Skin: Skin is warm and dry. No rashes noted. Psychiatric: Normal mood and affect. Behavior is normal.   ASSESSMENT AND PLAN: 75 year old female here for reassessment of the following:  Right upper quadrant pain Gallbladder polyp Lipoma of the stomach GERD Family history of colon cancer  Patient with a few years worth of right upper quadrant pain which has persisted over time.  She thinks getting worse with time.  She has had an EGD, CT scan, right upper quadrant ultrasound x2 over the past 3-4 years.  She incidentally has a gastric lipoma which I think would be unlikely to be the cause the symptoms, does not appear to be obstructing her lumen and she has no prandial association.  Incidentally noted to have very small gallbladder polyp.  This was surveyed over 1 year and a 1 mm difference, I think her symptoms are unlikely to represent biliary colic based on how she describes them today.  However she has had worsening symptoms of pain, suspect while this may be musculoskeletal, I offered her another CT scan given its been almost 4 years since she was last imaged to make sure nothing is changed intervally and to provide reassurance and peace of mind,  this can also reassess size of her gastric lipoma.  If this exam is normal we may refer her to sports medicine for musculoskeletal pain.  For her gallbladder polyp, given slight increase size in the last exam, would repeat another ultrasound in 1 year.  If the polyp grows significantly would refer for cholecystectomy but again symptoms unlikely to represent biliary colic as she describes them today.  Otherwise repeat colonoscopy in October 2023 for surveillance purposes given strong family history of colon cancer.  Of note, she ran out of her Lasix refills for fluid retention, she is seeking a new primary care and will refer her to one of our primary care, refilled her Lasix until she can establish with primary care.  Plan: - CT scan abdomen / pelvis with contrast - consideration for referral to sports medicine if CT negative - RUQ [place] of gallbladder in 1 year - Colononoscopy 03/2022 - refilled lasix per patient request so she does not run out, referred for new primary care physician  [place] Cellar, MD [facility]

## 2020-10-09 NOTE — Patient Instructions (Addendum)
If you are age 73 or older, your body mass index should be between 23-30. Your Body mass index is 30.38 kg/m. If this is out of the aforementioned range listed, please consider follow up with your Primary Care Provider.  If you are age 60 or younger, your body mass index should be between 19-25. Your Body mass index is 30.38 kg/m. If this is out of the aformentioned range listed, please consider follow up with your Primary Care Provider.   You will be contacted by [place] in the next 2 days to arrange a CT Abdomen & Pelvis.  The number on your caller ID will be [phone], please answer when they call.  If you have not heard from them in 2 days please call [phone] to schedule.    We are referring you to Primary Care.  They will contact you directly to schedule an appointment.  It may take a week or more;  Please feel free to contact us if you have not heard from them within 2 weeks and we will follow up.   We have sent the following medications to your pharmacy for you to pick up at your convenience: Potassium - Take once daily Lasix - Take once daily    (Please have your new Primary Care Provider manage these medications for you going forward)  You will be due for a recall colonoscopy in 03-2022. We will send you a reminder in the mail when it gets closer to that time.  Thank you for entrusting me with your care and for choosing [facility], Dr. Gardena Cellar

## 2020-10-17 ENCOUNTER — Ambulatory Visit (HOSPITAL_COMMUNITY)
Admission: RE | Admit: 2020-10-17 | Discharge: 2020-10-17 | Disposition: A | Discharge status: Home / Self Care | Payer: Medicare Other | Source: Ambulatory Visit | Attending: Gastroenterology | Admitting: Gastroenterology

## 2020-10-17 ENCOUNTER — Other Ambulatory Visit: Payer: Self-pay

## 2020-10-17 ENCOUNTER — Encounter (HOSPITAL_COMMUNITY): Payer: Self-pay

## 2020-10-17 DIAGNOSIS — R1011 Right upper quadrant pain: Secondary | ICD-10-CM

## 2020-10-17 DIAGNOSIS — K760 Fatty (change of) liver, not elsewhere classified: Secondary | ICD-10-CM | POA: Diagnosis not present

## 2020-10-17 DIAGNOSIS — R109 Unspecified abdominal pain: Secondary | ICD-10-CM | POA: Diagnosis not present

## 2020-10-17 LAB — POCT I-STAT CREATININE: Creatinine, Ser: 0.8 mg/dL (ref 0.44–1.00)

## 2020-10-17 MED ORDER — IOHEXOL 300 MG/ML  SOLN
100.0000 mL | Freq: Once | INTRAMUSCULAR | Status: AC | PRN
  Administered 2020-10-17: 75 mL via INTRAVENOUS

## 2020-10-17 MED ORDER — SODIUM CHLORIDE (PF) 0.9 % IJ SOLN
INTRAMUSCULAR | Status: AC
  Filled 2020-10-17: qty 50

## 2020-10-27 ENCOUNTER — Other Ambulatory Visit: Payer: Self-pay

## 2020-10-27 ENCOUNTER — Ambulatory Visit (INDEPENDENT_AMBULATORY_CARE_PROVIDER_SITE_OTHER): Payer: Medicare Other | Admitting: Internal Medicine

## 2020-10-27 ENCOUNTER — Ambulatory Visit (INDEPENDENT_AMBULATORY_CARE_PROVIDER_SITE_OTHER): Payer: Medicare Other

## 2020-10-27 DIAGNOSIS — M329 Systemic lupus erythematosus, unspecified: Secondary | ICD-10-CM

## 2020-10-27 DIAGNOSIS — Z8709 Personal history of other diseases of the respiratory system: Secondary | ICD-10-CM | POA: Diagnosis not present

## 2020-10-27 DIAGNOSIS — R0789 Other chest pain: Secondary | ICD-10-CM | POA: Diagnosis not present

## 2020-10-27 DIAGNOSIS — R079 Chest pain, unspecified: Secondary | ICD-10-CM | POA: Diagnosis not present

## 2020-10-27 NOTE — Progress Notes (Signed)
Subjective:     Patient ID: Jane Chavez, female   DOB: March 30, 1946, 75 y.o.   MRN: [id number]  HPI  Chief Complaint  Patient presents with   Follow-up    Referring provider: [name], MD  HPI: 75 year old female never smoker with lupus seen for pulmonary consult during hospitalization 2017 for bilateral effusions. Patient has grade 2 diastolic congestive heart failure  Jane Chavez is a 75 y.o. female with Lupus off medical therapies >5 years. Now on prednisone and Plaquenil, grade II diastolic congestive heart failure, thrombocytopenia, and history of bilateral pleural effusions.   10/23/2015 Hospital Follow Up: Patient presents to the office for hospital follow-up of bilateral pleural effusions which were treated with bilateral thoracentesis.She had been off treatment for her Lupus for >5 years, is now on prednisone, lasix  and Plaquenil. She denies shortness of breath and states she is feeling much better.Denies chest pain, SOB, fever, leg or calf pain.She denies cough. She has noted swelling in her lower extremities. She is compliant with lasix treatment, prednisone, Plaquenil and Lipitor. She has followed up with Dr. Dossie Der, rheumatology, and is following up with her PCP next week. She is currently on 80 mg of prednisone daily which is being managed by Dr. Dossie Der..  Significant Events/Procedures:  Admit date: 10/03/2015 Discharge date: 10/05/2015  Principal Problem:   Bilateral pleural effusion (Recurrent) Active Problems:   Acute respiratory failure with hypoxia (HCC)   Left ventricular diatolic dysfunction, NYHA class 2   SLE (systemic lupus erythematosus) (HCC)   Recurrent pleural effusion on left   Pleural effusion, bilateral   SOB (shortness of breath)  Procedures:  4/25 left thoracentesis yielding 1.45 L clear yellow fluid 4/26 right thoracentesis Yielded 1 liter of clear yellow fluid. 09/22/2015: Echo: 65-70%  EF  10/23/2015: CXR: Small-to-moderate-sized bilateral pleural effusions slightly less than that seen on October 04 2015. There is no pneumothorax.   OV 10/16/2016  Chief Complaint  Patient presents with   Follow-up    pt states breathing is Jane but she does get a little winded here and there chest xray showed fluid on her lungs so Dr. Dorathy Daft told her to see her pulmonologist   75 year old African-American female who I'm seeing for the first time. Last seen in our office just over a year ago. Based on review of the chart and her history she tells me that she is living [place] and was diagnosed with lupus of the joints and skin rash over 10 years ago. Some 6 years ago she moved to [place]. She was already on chronic prednisone. Some 4 years ago she establish with [facility] rheumatology Department. Since then she's been on Plaquenil. Then approximately in April 2017 started developing bilateral pleural effusions but review of the chart shows transudate and nondiagnostic cytology on 3 occasions all in April 2017. She's been maintained on Lasix with potassium. This has kept her under control. Then she reports that earlier this year she saw her Medicare physician Dr. Ashby Dawes and chest x-ray suggested pleural effusion. Indicates approximately a month ago. The same thing is some pleural effusion. She only has mild dyspnea on extreme exertion. She's been referred back to pulmonary. I do not have the most recent chest x-ray from primary care physician's office. But I did  visualize all the 2017 films and I concur with the report. Other than that she feels well.   11/05/2016 Follow up : Pleural effusions Patient returns for a two-week follow-up. Patient was  seen in the office for evaluation of bilateral pleural effusions. Patient was seen in during hospitalization. April 2017 for bilateral pleural effusions. Patient underwent thoracentesis twice in April 2017. Was felt to be  transudate of effusions. Patient was felt this may be secondary to her lupus. And was referred to rheumatology. She is followed by rheumatology and is currently on prednisone 10 mg and Plaquenil.  Echo April 2017 showed normal EF and grade 2 diastolic dysfunction. Patient is on Lasix 80 mg daily .Seen by PCP last month told bnp was nml .  Last visit. Patient was set up for a thoracentesis or left pleural effusion with 330 cc of pleural fluid removed. It appeared to be transudate. Cytology was negative for malignant cells..  Jane Chavez 01/26/2018  Chief Complaint  Patient presents with   Acute Visit    Pt has been having pain in lungs x2 weeks. Pt states the pain is mainly pressure and she is also having pain in her back. Pt is also having SOB and tightness in chest. Pt denies any cough.   75 year old female personally not seen in this practice for over a year. She is known to have lupus with diastolic dysfunctionand transudative left pleural effusion over a year ago. She follows with Dr. Dossie Der for her lupus which is treated with Imuran, prednisone and Plaquenil. She is here acutely because for the last few to several weeks she's had upper back suprascapular and infrascapular spasms with pain associated with deep inspiration but she also has palpable tenderness in this area. She also feels heavy in the central chest area and particularly feels dyspneic when she lies down. She also feels like she has to take a good deep breath in order to feelthat she is getting enough oxygen. Dyspnea is also worse when she exerts herself. She feels that her pleural effusion might be coming back she also feels that it might be similar to her pulmonary embolism that she suffered 20 years ago while in [place] [is a new history that we got today] for which she took Coumadin at that time. This no orthopnea or edema or paroxysmal nocturnal dyspnea  She had CT abdomen in December 2018 lung cutthat I personally visualized showed very  minimal pleural effusion. December 2018 creatinine was normal  OV 09/26/2020  Subjective:  Patient ID: Jane Chavez, female , DOB: Sep 19, 1945 , age 75 y.o. , MRN: [id number] , ADDRESS: [id number] [place] [id number] PCP [name], MD Patient Care Team: [name], MD as PCP - General (Rheumatology)  This Provider for this visit: Treatment Team:  Attending Provider: Brand Males, MD    09/26/2020 -   Chief Complaint  Patient presents with   Follow-up    Chest and back pain/tightness for 2 weeks. Occ dry cough   Acute visit  HPI Jane Chavez 75 y.o. -female with history of lupus erythematosus on prednisone, Plaquenil and azathioprine according to history.  Sees Dr. Dossie Der in [facility] rheumatology.  Last visit with the rheumatologist was 3 months ago with normal lab work.  She has not been seen in this practice for 3 years almost.  She is here because for the last 2 weeks she has had nonspecific musculoskeletal pain in the center part of the chest left and right infra axillary area and also in the back.  This pain is made worse by twisting motions of the chest and the upper back.  It is not associated with exertion.  There is no associated  shortness of breath or wheezing orthopnea paroxysmal nocturnal dyspnea fever or chills.  She is really worried about her previous pleural effusion coming back.  She wants investigation for this she does not want blood work.   OV 10/27/2020  Subjective:  Patient ID: Jane Chavez, female , DOB: Aug 27, 1945 , age 48 y.o. , MRN: [id number] , ADDRESS: [address] [place] [id number] PCP [name], MD Patient Care Team: [name], MD as PCP - General (Rheumatology)  Type of visit: Telephone/Video Circumstance: COVID-19 national emergency Identification of patient Jane Chavez with 01-04-1946 and MRN [id number] - 2 person identifier Risks: Risks, benefits, limitations of telephone visit explained. Patient understood and  verbalized agreement to proceed Anyone else on call:  Patient location: [phone] This provider location:    This Provider for this visit: Treatment Team:  Attending Provider: Brand Males, MD    10/27/2020 -  No chief complaint on file.    HPI Jane Chavez 75 y.o. -      has a past medical history of Back stiffness (03/13/2017), Chronic abdominal pain, Diverticulosis, Gallbladder polyp, GERD (gastroesophageal reflux disease), History of colon polyps, HLD (hyperlipidemia), Lipoma of stomach, Lupus ([place]) (2014), and Pleural effusion (09/2015).   reports that she has never smoked. She has never used smokeless tobacco.  Past Surgical History:  Procedure Laterality Date   BREAST LUMPECTOMY Right    CATARACT EXTRACTION, BILATERAL     ESOPHAGOGASTRODUODENOSCOPY N/A 09/09/2017   Procedure: ESOPHAGOGASTRODUODENOSCOPY (EGD);  Surgeon: Yetta Flock, MD;  Location: [facility];  Service: Gastroenterology;  Laterality: N/A;   REPLACEMENT TOTAL KNEE Right    TOTAL KNEE ARTHROPLASTY Left 04/24/2020   Procedure: TOTAL KNEE ARTHROPLASTY;  Surgeon: Gaynelle Arabian, MD;  Location: [facility];  Service: Orthopedics;  Laterality: Left;  60min    Allergies  Allergen Reactions   Penicillins Shortness Of Breath    Has patient had a PCN reaction causing immediate rash, facial/tongue/throat swelling, SOB or lightheadedness with hypotension: Yes Has patient had a PCN reaction causing severe rash involving mucus membranes or skin necrosis: No Has patient had a PCN reaction that required hospitalization No Has patient had a PCN reaction occurring within the last 10 years: No If all of the above answers are "NO", then may proceed with Cephalosporin use.     Immunization History  Administered Date(s) Administered   Influenza, High Dose Seasonal PF 03/30/2015, 03/06/2016, 04/10/2017   Influenza, Quadrivalent, Recombinant, Inj, Pf 03/20/2017, 02/24/2018, 03/04/2019, 03/03/2020    Influenza,inj,Quad PF,6+ Mos 04/10/2016   Influenza,inj,quad, With Preservative 03/10/2020   Influenza-Unspecified 06/10/2018   Moderna Sars-Covid-2 Vaccination 08/06/2019, 09/01/2019   PFIZER(Purple Top)SARS-COV-2 Vaccination 08/06/2019, 09/01/2019   PPD Test 04/05/2015   Pneumococcal Conjugate-13 02/19/2013   Pneumococcal Polysaccharide-23 01/23/2017   Zoster Recombinat (Shingrix) 08/17/2019    Family History  Problem Relation Age of Onset   Colon cancer Sister    Other Brother        colon surgery, growth removed   Colon cancer Other        niece, deceased 2016-08-15   Asthma Mother      Current Outpatient Medications:    azaTHIOprine (IMURAN) 50 MG tablet, Take 100 mg by mouth daily. , Disp: , Rfl: 2   carboxymethylcellul-glycerin (REFRESH OPTIVE) 0.5-0.9 % ophthalmic solution, Place 1 drop into both eyes 2 (two) times daily as needed for dry eyes., Disp: , Rfl:    cholecalciferol (VITAMIN D3) 25 MCG (1000 UNIT) tablet, Take 1,000 Units by mouth  daily., Disp: , Rfl:    famotidine (PEPCID) 20 MG tablet, TAKE 1-2 TABLETS (20-40 MG TOTAL) BY MOUTH DAILY. (Patient taking differently: Take 20-40 mg by mouth daily as needed for heartburn or indigestion.), Disp: 180 tablet, Rfl: 1   furosemide (LASIX) 80 MG tablet, Take 1 tablet (80 mg total) by mouth daily. Please have PCP manage going forward., Disp: 30 tablet, Rfl: 0   hydroxychloroquine (PLAQUENIL) 200 MG tablet, Take 1 tablet (200 mg total) by mouth daily., Disp: 30 tablet, Rfl: 0   methocarbamol (ROBAXIN) 500 MG tablet, Take 1 tablet (500 mg total) by mouth every 6 (six) hours as needed for muscle spasms. (Patient not taking: Reported on 10/09/2020), Disp: 40 tablet, Rfl: 0   potassium chloride SA (KLOR-CON) 20 MEQ tablet, Take 1 tablet (20 mEq total) by mouth daily. Please have PCP manage going forward., Disp: 30 tablet, Rfl: 0   predniSONE (DELTASONE) 5 MG tablet, Take 5 mg by mouth daily with breakfast. On 20mg  currently-tapering  down to 5mg  once daily., Disp: , Rfl:    vitamin B-12 (CYANOCOBALAMIN) 1000 MCG tablet, Take 1,000 mcg by mouth daily., Disp: , Rfl:       Objective:   There were no vitals filed for this visit.  Estimated body mass index is 30.38 kg/m as calculated from the following:   Height as of 10/09/20: 5\' 1"  (1.549 m).   Weight as of 10/09/20: 160 lb 12.8 oz (72.9 kg).  @WEIGHTCHANGE @  There were no vitals filed for this visit. Cancelle ivis   Assessment:     No diagnosis found.     Plan:     There are no Patient Instructions on file for this visit.  Canceled visit  SIGNATURE    Dr. Brand Males, M.D., F.C.C.P,  Pulmonary and Critical Care Medicine Staff Physician, [place] Director - Interstitial Lung Disease  Program  [facility] [place], [place], [address]  Pager: [phone], If no answer or between  15:00h - 7:00h: call [phone] Telephone: [phone]  9:45 AM 10/27/2020

## 2020-10-27 NOTE — Patient Instructions (Signed)
ICD-10-CM   1. History of systemic lupus erythematosus (SLE) (HCC)  M32.9   2. History of pleural effusion  Z87.09   3. Chest pain, atypical  R07.89    Pain sounds very musculoskeletal and not lung related  Plan  cxr 2 view Echo  Also discuss with PCP and rheumatology  Followup  - telephone visit with app in few weeks to discuss results 

## 2020-10-30 ENCOUNTER — Ambulatory Visit (HOSPITAL_COMMUNITY): Payer: Medicare Other | Attending: Cardiovascular Disease

## 2020-10-30 ENCOUNTER — Other Ambulatory Visit: Payer: Self-pay

## 2020-10-30 DIAGNOSIS — R0789 Other chest pain: Secondary | ICD-10-CM

## 2020-10-30 DIAGNOSIS — Z8709 Personal history of other diseases of the respiratory system: Secondary | ICD-10-CM | POA: Diagnosis not present

## 2020-10-30 DIAGNOSIS — M329 Systemic lupus erythematosus, unspecified: Secondary | ICD-10-CM | POA: Diagnosis not present

## 2020-10-30 LAB — ECHOCARDIOGRAM COMPLETE
Area-P 1/2: 3.48 cm2
S' Lateral: 2.5 cm

## 2020-11-01 ENCOUNTER — Other Ambulatory Visit: Payer: Self-pay | Admitting: Gastroenterology

## 2020-11-01 ENCOUNTER — Ambulatory Visit (INDEPENDENT_AMBULATORY_CARE_PROVIDER_SITE_OTHER): Payer: Medicare Other | Admitting: Internal Medicine

## 2020-11-01 ENCOUNTER — Other Ambulatory Visit: Payer: Self-pay

## 2020-11-01 ENCOUNTER — Encounter: Payer: Self-pay | Admitting: Internal Medicine

## 2020-11-01 VITALS — BP 118/64 | HR 82 | Temp 97.8°F | Ht 61.0 in | Wt 158.0 lb

## 2020-11-01 DIAGNOSIS — Z8709 Personal history of other diseases of the respiratory system: Secondary | ICD-10-CM | POA: Diagnosis not present

## 2020-11-01 DIAGNOSIS — M329 Systemic lupus erythematosus, unspecified: Secondary | ICD-10-CM | POA: Diagnosis not present

## 2020-11-01 DIAGNOSIS — R0789 Other chest pain: Secondary | ICD-10-CM | POA: Diagnosis not present

## 2020-11-01 NOTE — Patient Instructions (Addendum)
ICD-10-CM   1. History of systemic lupus erythematosus (SLE) ([place])  M32.9   2. History of pleural effusion  Z87.09   3. Chest pain, atypical  R07.89    Pain sounds very musculoskeletal and not lung related Ct abdomen lung images look clear May 2022 Chest x-ray clear May 2022 Echocardiogram normal May 2022  There is a gastric lipoma on the CT May 2022 but unclear if this is related to the pain  No pulmonary causes for pain identified  Plan Follow-up with Dr. Havery Moros GI and rheumatology and primary care physician If interventions for gastric lipoma do not help then consider MRI spine but through primary care physician or rheumatology  Followup  -1 year or sooner if needed

## 2020-11-01 NOTE — Progress Notes (Signed)
Subjective:     Patient ID: Jane Chavez, female   DOB: May 06, 1946, 75 y.o.   MRN: [id number]  HPI  Chief Complaint  Patient presents with  . Follow-up    Referring provider: [name], MD  HPI: 75 year old female never smoker with lupus seen for pulmonary consult during hospitalization 2017 for bilateral effusions. Patient has grade 2 diastolic congestive heart failure  Jane Chavez is a 75 y.o. female with Lupus off medical therapies >5 years. Now on prednisone and Plaquenil, grade II diastolic congestive heart failure, thrombocytopenia, and history of bilateral pleural effusions.   10/23/2015 Hospital Follow Up: Patient presents to the office for hospital follow-up of bilateral pleural effusions which were treated with bilateral thoracentesis.She had been off treatment for her Lupus for >5 years, is now on prednisone, lasix  and Plaquenil. She denies shortness of breath and states she is feeling much better.Denies chest pain, SOB, fever, leg or calf pain.She denies cough. She has noted swelling in her lower extremities. She is compliant with lasix treatment, prednisone, Plaquenil and Lipitor. She has followed up with Dr. Dossie Der, rheumatology, and is following up with her PCP next week. She is currently on 80 mg of prednisone daily which is being managed by Dr. Dossie Der..  Significant Events/Procedures:  Admit date: 10/03/2015 Discharge date: 10/05/2015  Principal Problem:  Bilateral pleural effusion (Recurrent) Active Problems:  Acute respiratory failure with hypoxia (HCC)  Left ventricular diatolic dysfunction, NYHA class 2  SLE (systemic lupus erythematosus) (HCC)  Recurrent pleural effusion on left  Pleural effusion, bilateral  SOB (shortness of breath)  Procedures:  1. 4/25 left thoracentesis yielding 1.45 L clear yellow fluid 2. 4/26 right thoracentesis Yielded 1 liter of clear yellow fluid. 3. 09/22/2015: Echo: 65-70%  EF  10/23/2015: CXR: Small-to-moderate-sized bilateral pleural effusions slightly less than that seen on October 04 2015. There is no pneumothorax.   OV 10/16/2016  Chief Complaint  Patient presents with  . Follow-up    pt states breathing is Jane but she does get a little winded here and there chest xray showed fluid on her lungs so Dr. Dorathy Daft told her to see her pulmonologist   75 year old African-American female who I'm seeing for the first time. Last seen in our office just over a year ago. Based on review of the chart and her history she tells me that she is living [place] and was diagnosed with lupus of the joints and skin rash over 10 years ago. Some 6 years ago she moved to [place]. She was already on chronic prednisone. Some 4 years ago she establish with [facility] rheumatology Department. Since then she's been on Plaquenil. Then approximately in April 2017 started developing bilateral pleural effusions but review of the chart shows transudate and nondiagnostic cytology on 3 occasions all in April 2017. She's been maintained on Lasix with potassium. This has kept her under control. Then she reports that earlier this year she saw her Medicare physician Dr. Ashby Dawes and chest x-ray suggested pleural effusion. Indicates approximately a month ago. The same thing is some pleural effusion. She only has mild dyspnea on extreme exertion. She's been referred back to pulmonary. I do not have the most recent chest x-ray from primary care physician's office. But I did  visualize all the 2017 films and I concur with the report. Other than that she feels well.   11/05/2016 Follow up : Pleural effusions Patient returns for a two-week follow-up. Patient was seen in the office  for evaluation of bilateral pleural effusions. Patient was seen in during hospitalization. April 2017 for bilateral pleural effusions. Patient underwent thoracentesis twice in April 2017. Was felt to be  transudate of effusions. Patient was felt this may be secondary to her lupus. And was referred to rheumatology. She is followed by rheumatology and is currently on prednisone 10 mg and Plaquenil.  Echo April 2017 showed normal EF and grade 2 diastolic dysfunction. Patient is on Lasix 80 mg daily .Seen by PCP last month told bnp was nml .  Last visit. Patient was set up for a thoracentesis or left pleural effusion with 330 cc of pleural fluid removed. It appeared to be transudate. Cytology was negative for malignant cells..  Jane Chavez 01/26/2018  Chief Complaint  Patient presents with  . Acute Visit    Pt has been having pain in lungs x2 weeks. Pt states the pain is mainly pressure and she is also having pain in her back. Pt is also having SOB and tightness in chest. Pt denies any cough.   75 year old female personally not seen in this practice for over a year. She is known to have lupus with diastolic dysfunctionand transudative left pleural effusion over a year ago. She follows with Dr. Dossie Der for her lupus which is treated with Imuran, prednisone and Plaquenil. She is here acutely because for the last few to several weeks she's had upper back suprascapular and infrascapular spasms with pain associated with deep inspiration but she also has palpable tenderness in this area. She also feels heavy in the central chest area and particularly feels dyspneic when she lies down. She also feels like she has to take a good deep breath in order to feelthat she is getting enough oxygen. Dyspnea is also worse when she exerts herself. She feels that her pleural effusion might be coming back she also feels that it might be similar to her pulmonary embolism that she suffered 20 years ago while in [place] [is a new history that we got today] for which she took Coumadin at that time. This no orthopnea or edema or paroxysmal nocturnal dyspnea  She had CT abdomen in December 2018 lung cutthat I personally visualized showed very  minimal pleural effusion. December 2018 creatinine was normal  OV 09/26/2020  Subjective:  Patient ID: Jane Chavez, female , DOB: May 14, 1946 , age 63 y.o. , MRN: [id number] , ADDRESS: [address] [place] [address] PCP [name], MD Patient Care Team: [name], MD as PCP - General (Rheumatology)  This Provider for this visit: Treatment Team:  Attending Provider: Brand Males, MD    09/26/2020 -   Chief Complaint  Patient presents with  . Follow-up    Chest and back pain/tightness for 2 weeks. Occ dry cough   Acute visit  HPI Jane Chavez 75 y.o. -female with history of lupus erythematosus on prednisone, Plaquenil and azathioprine according to history.  Sees Dr. Dossie Der in [facility] rheumatology.  Last visit with the rheumatologist was 3 months ago with normal lab work.  She has not been seen in this practice for 3 years almost.  She is here because for the last 2 weeks she has had nonspecific musculoskeletal pain in the center part of the chest left and right infra axillary area and also in the back.  This pain is made worse by twisting motions of the chest and the upper back.  It is not associated with exertion.  There is no associated shortness of breath or  wheezing orthopnea paroxysmal nocturnal dyspnea fever or chills.  She is really worried about her previous pleural effusion coming back.  She wants investigation for this she does not want blood work.   OV 11/01/2020  Subjective:  Patient ID: Jane Chavez, female , DOB: [birth year] , age 55 y.o. , MRN: [id number] , ADDRESS: [address] [place] [address] PCP [name], MD Patient Care Team: [name], MD as PCP - General (Rheumatology)  This Provider for this visit: Treatment Team:  Attending Provider: Brand Males, MD    11/01/2020 -   Chief Complaint  Patient presents with  . Follow-up    Chest xray performed 5/20 and echo performed 5/23.  Pt states she is still having pain in  her sides, ribs, which radiates to the back. Denies any complaints of cough.     HPI Jane Chavez 75 y.o. -presents for her atypical chest pain work-up.  She tells me that the pain is still ongoing.  Its been ongoing for a month.  She points to her right inframammary right infra axillary area.  It goes to the back.  She says positions such as standing up or lying down make it worse.  I asked her to rotate her body with her standing and this did not exacerbate the pain.  When I pressed that she could feel the pain but it was not tender.  There is no other fever or any other symptoms.  She underwent CT abdomen.  The lung images are clear I personally visualized this.  There is a gastric lipoma.  Dr. Havery Moros is going to do endoscopy.  There is a possibility that this might be causing the pain although recently this is in a different location.  She had a chest x-ray that is clear I personally visualized this.  She also had echocardiogram that is normal.  I explained to her that I do not know the cause for this pain.  There is no wheezing or cough or fever or chills.  She says she tried a course of prednisone with Dr. Dossie Der and did not help.   Narrative & Impression  CLINICAL DATA:  Chest pain  EXAM: CHEST - 2 VIEW  COMPARISON:  09/16/2019  FINDINGS: Heart and mediastinal contours are within normal limits. No focal opacities or effusions. No acute bony abnormality.  IMPRESSION: No active cardiopulmonary disease.   Electronically Signed   By: Rolm Baptise M.D.   On: 10/27/2020 11:06     CT abd 10/17/20  Narrative & Impression  CLINICAL DATA:  Right upper quadrant abdominal pain.  EXAM: CT ABDOMEN AND PELVIS WITH CONTRAST  TECHNIQUE: Multidetector CT imaging of the abdomen and pelvis was performed using the standard protocol following bolus administration of intravenous contrast.  CONTRAST:  73mL OMNIPAQUE IOHEXOL 300 MG/ML  SOLN  COMPARISON:  Ultrasound September 28, 2020 and CT abdomen pelvis June 06, 2017  FINDINGS: Lower chest: No acute abnormality.  Small hiatal hernia.  Hepatobiliary: Mild diffuse hepatic steatosis. No suspicious hepatic lesion. Gallbladder is unremarkable. No biliary ductal dilation.  Pancreas: Within normal limits.  Spleen: Atrophic  Adrenals/Urinary Tract: Bilateral adrenal glands are unremarkable.  1.2 cm cyst in the right lower pole. Tiny bilateral hypodense renal lesions which are technically too small to accurately characterize but favored represent cysts. Symmetric enhancement excretion of contrast in the bilateral kidneys. No suspicious filling defect visualized within the opacified portions of the collecting system and proximal ureters on delayed imaging.  Urinary bladder  is decompressed limiting evaluation  Stomach/Bowel: Small hiatal hernia. Increased size of the fat density lesion indent gastric antrum which now measures 4.1 x 2.4 cm previously 2.4 x 1.7 cm no pathologic dilation of small bowel. Appendix is grossly unremarkable. Enteric contrast visualized to the level of the descending colon. Moderate volume of formed stool throughout the colon.  Vascular/Lymphatic: Aortic atherosclerosis without aneurysmal dilation. No pathologically enlarged abdominal or pelvic lymph nodes.  Reproductive: Leiomyomatous uterus.  No suspicious adnexal lesions.  Other: No abdominopelvic ascites. Pelvic floor laxity with small cystocele.  Musculoskeletal: Mild multilevel degenerative changes spine. Diffuse demineralization of bone. No acute osseous abnormality.  IMPRESSION: 1. No acute abnormality in the abdomen or pelvis. 2. Increased size of the 4.1 cm fat density lesion projecting into the gastric antrum, favored to represent a gastric lipoma. 3. Mild diffuse hepatic steatosis. 4. Moderate volume of formed stool throughout the colon. 5. Pelvic floor laxity with small cystocele. 6. Aortic  atherosclerosis.  Aortic Atherosclerosis (ICD10-I70.0).   Electronically Signed   By: Dahlia Bailiff MD   On: 10/18/2020 15:1     ECHOCARDIOGRAM COMPLETE  Result Date: 10/30/2020    ECHOCARDIOGRAM REPORT   Patient Name:   Jane Chavez Date of Exam: 10/30/2020 Medical Rec #:  [id number]     Height:       61.0 in Accession #:    [id number]    Weight:       160.8 lb Date of Birth:  10/19/1945     BSA:          1.722 m Patient Age:    27 years      BP:           116/64 mmHg Patient Gender: F             HR:           84 bpm. Exam Location:  [facility] Procedure: 2D Echo, Cardiac Doppler and Color Doppler Indications:    R07.9 Chest pain  History:        Patient has prior history of Echocardiogram examinations, most                 recent 02/03/2018. Signs/Symptoms:Chest Pain; Risk                 Factors:Dyslipidemia.  Sonographer:    Coralyn Helling RDCS Referring Phys: [place]  1. Left ventricular ejection fraction, by estimation, is 60 to 65%. The left ventricle has normal function. The left ventricle has no regional wall motion abnormalities. Left ventricular diastolic parameters were normal.  2. Right ventricular systolic function is normal. The right ventricular size is normal.  3. Left atrial size was mildly dilated.  4. The mitral valve is abnormal. Trivial mitral valve regurgitation. No evidence of mitral stenosis.  5. The aortic valve is normal in structure. Aortic valve regurgitation is not visualized. No aortic stenosis is present.  6. The inferior vena cava is normal in size with greater than 50% respiratory variability, suggesting right atrial pressure of 3 mmHg. FINDINGS  Left Ventricle: Left ventricular ejection fraction, by estimation, is 60 to 65%. The left ventricle has normal function. The left ventricle has no regional wall motion abnormalities. The left ventricular internal cavity size was normal in size. There is  no left ventricular hypertrophy. Left  ventricular diastolic parameters were normal. Right Ventricle: The right ventricular size is normal. No increase in right ventricular wall thickness. Right ventricular systolic function is normal.  Left Atrium: Left atrial size was mildly dilated. Right Atrium: Right atrial size was normal in size. Pericardium: There is no evidence of pericardial effusion. Mitral Valve: The mitral valve is abnormal. There is mild thickening of the mitral valve leaflet(s). There is mild calcification of the mitral valve leaflet(s). Mild mitral annular calcification. Trivial mitral valve regurgitation. No evidence of mitral valve stenosis. Tricuspid Valve: The tricuspid valve is normal in structure. Tricuspid valve regurgitation is not demonstrated. No evidence of tricuspid stenosis. Aortic Valve: The aortic valve is normal in structure. Aortic valve regurgitation is not visualized. No aortic stenosis is present. Pulmonic Valve: The pulmonic valve was normal in structure. Pulmonic valve regurgitation is not visualized. No evidence of pulmonic stenosis. Aorta: The aortic root is normal in size and structure. Venous: The inferior vena cava is normal in size with greater than 50% respiratory variability, suggesting right atrial pressure of 3 mmHg. IAS/Shunts: No atrial level shunt detected by color flow Doppler.  LEFT VENTRICLE PLAX 2D LVIDd:         4.10 cm  Diastology LVIDs:         2.50 cm  LV e' medial:    6.28 cm/s LV PW:         1.00 cm  LV E/e' medial:  15.4 LV IVS:        0.80 cm  LV e' lateral:   6.36 cm/s LVOT diam:     1.80 cm  LV E/e' lateral: 15.2 LV SV:         62 LV SV Index:   36 LVOT Area:     2.54 cm  RIGHT VENTRICLE             IVC RV S prime:     17.24 cm/s  IVC diam: 0.90 cm TAPSE (M-mode): 2.4 cm LEFT ATRIUM             Index       RIGHT ATRIUM           Index LA diam:        3.30 cm 1.92 cm/m  RA Pressure: 3.00 mmHg LA Vol (A2C):   62.1 ml 36.07 ml/m RA Area:     12.60 cm LA Vol (A4C):   81.4 ml 47.28 ml/m RA  Volume:   29.50 ml  17.14 ml/m LA Biplane Vol: 73.6 ml 42.75 ml/m  AORTIC VALVE LVOT Vmax:   114.80 cm/s LVOT Vmean:  81.380 cm/s LVOT VTI:    0.243 m  AORTA Ao Root diam: 3.10 cm Ao Asc diam:  2.80 cm MV E velocity: 96.82 cm/s   TRICUSPID VALVE MV A velocity: 113.80 cm/s  Estimated RAP:  3.00 mmHg MV E/A ratio:  0.85                             SHUNTS                             Systemic VTI:  0.24 m                             Systemic Diam: 1.80 cm Jenkins Rouge MD Electronically signed by Jenkins Rouge MD Signature Date/Time: 10/30/2020/10:53:11 AM    Final       PFT  No flowsheet data found.     has a past medical history of  Back stiffness (03/13/2017), Chronic abdominal pain, Diverticulosis, Gallbladder polyp, GERD (gastroesophageal reflux disease), History of colon polyps, HLD (hyperlipidemia), Lipoma of stomach, Lupus ([place]) (2012-09-13), and Pleural effusion (09/2015).   reports that she has never smoked. She has never used smokeless tobacco.  Past Surgical History:  Procedure Laterality Date  . BREAST LUMPECTOMY Right   . CATARACT EXTRACTION, BILATERAL    . ESOPHAGOGASTRODUODENOSCOPY N/A 09/09/2017   Procedure: ESOPHAGOGASTRODUODENOSCOPY (EGD);  Surgeon: Yetta Flock, MD;  Location: [facility];  Service: Gastroenterology;  Laterality: N/A;  . REPLACEMENT TOTAL KNEE Right   . TOTAL KNEE ARTHROPLASTY Left 04/24/2020   Procedure: TOTAL KNEE ARTHROPLASTY;  Surgeon: Gaynelle Arabian, MD;  Location: [facility];  Service: Orthopedics;  Laterality: Left;  17min    Allergies  Allergen Reactions  . Penicillins Shortness Of Breath    Has patient had a PCN reaction causing immediate rash, facial/tongue/throat swelling, SOB or lightheadedness with hypotension: Yes Has patient had a PCN reaction causing severe rash involving mucus membranes or skin necrosis: No Has patient had a PCN reaction that required hospitalization No Has patient had a PCN reaction occurring within the last 10 years:  No If all of the above answers are "NO", then may proceed with Cephalosporin use.     Immunization History  Administered Date(s) Administered  . Influenza, High Dose Seasonal PF 03/30/2015, 03/06/2016, 04/10/2017  . Influenza, Quadrivalent, Recombinant, Inj, Pf 03/20/2017, 02/24/2018, 03/04/2019, 03/03/2020  . Influenza,inj,Quad PF,6+ Mos 04/10/2016  . Influenza,inj,quad, With Preservative 03/10/2020  . Influenza-Unspecified 06/10/2018  . PFIZER(Purple Top)SARS-COV-2 Vaccination 08/06/2019, 09/01/2019  . PPD Test 04/05/2015  . Pneumococcal Conjugate-13 02/19/2013  . Pneumococcal Polysaccharide-23 01/23/2017  . Zoster Recombinat (Shingrix) 08/17/2019    Family History  Problem Relation Age of Onset  . Colon cancer Sister   . Other Brother        colon surgery, growth removed  . Colon cancer Other        niece, deceased 09/13/16  . Asthma Mother      Current Outpatient Medications:  .  azaTHIOprine (IMURAN) 50 MG tablet, Take 100 mg by mouth daily. , Disp: , Rfl: 2 .  carboxymethylcellul-glycerin (REFRESH OPTIVE) 0.5-0.9 % ophthalmic solution, Place 1 drop into both eyes 2 (two) times daily as needed for dry eyes., Disp: , Rfl:  .  cholecalciferol (VITAMIN D3) 25 MCG (1000 UNIT) tablet, Take 1,000 Units by mouth daily., Disp: , Rfl:  .  famotidine (PEPCID) 20 MG tablet, TAKE 1-2 TABLETS (20-40 MG TOTAL) BY MOUTH DAILY. (Patient taking differently: Take 20-40 mg by mouth daily as needed for heartburn or indigestion.), Disp: 180 tablet, Rfl: 1 .  furosemide (LASIX) 80 MG tablet, Take 1 tablet (80 mg total) by mouth daily. Please have PCP manage going forward., Disp: 30 tablet, Rfl: 0 .  hydroxychloroquine (PLAQUENIL) 200 MG tablet, Take 1 tablet (200 mg total) by mouth daily., Disp: 30 tablet, Rfl: 0 .  potassium chloride SA (KLOR-CON) 20 MEQ tablet, Take 1 tablet (20 mEq total) by mouth daily. Please have PCP manage going forward., Disp: 30 tablet, Rfl: 0 .  predniSONE  (DELTASONE) 5 MG tablet, Take 5 mg by mouth daily with breakfast. On 20mg  currently-tapering down to 5mg  once daily., Disp: , Rfl:  .  vitamin B-12 (CYANOCOBALAMIN) 1000 MCG tablet, Take 1,000 mcg by mouth daily., Disp: , Rfl:       Objective:   Vitals:   11/01/20 1132  BP: 118/64  Pulse: 82  Temp: 97.8 F (36.6 C)  TempSrc: Temporal  SpO2: 99%  Weight: 158 lb (71.7 kg)  Height: 5\' 1"  (1.549 m)    Estimated body mass index is 29.85 kg/m as calculated from the following:   Height as of this encounter: 5\' 1"  (1.549 m).   Weight as of this encounter: 158 lb (71.7 kg).  @WEIGHTCHANGE @  [employer]   11/01/20 1132  Weight: 158 lb (71.7 kg)     Physical Exam   General: No distress. Looks well Neuro: Alert and Oriented x 3. GCS 15. Speech normal Psych: Pleasant Resp:  Barrel Chest - no.  Wheeze - no, Crackles - no, No overt respiratory distress CVS: Normal heart sounds. Murmurs - no Ext: Stigmata of Connective Tissue Disease - no HEENT: Normal upper airway. PEERL +. No post nasal drip        Assessment:       ICD-10-CM   1. Chest pain, atypical  R07.89   2. History of pleural effusion  Z87.09   3. History of systemic lupus erythematosus (SLE) ([place])  M32.9        Plan:     Patient Instructions     ICD-10-CM   1. History of systemic lupus erythematosus (SLE) ([place])  M32.9   2. History of pleural effusion  Z87.09   3. Chest pain, atypical  R07.89    Pain sounds very musculoskeletal and not lung related Ct abdomen lung images look clear May 2022 Chest x-ray clear May 2022 Echocardiogram normal May 2022  There is a gastric lipoma on the CT May 2022 but unclear if this is related to the pain  No pulmonary causes for pain identified  Plan Follow-up with Dr. Havery Moros GI and rheumatology and primary care physician If interventions for gastric lipoma do not help then consider MRI spine but through primary care physician or rheumatology  Followup  -1  year or sooner if needed     SIGNATURE    Dr. Brand Males, M.D., F.C.C.P,  Pulmonary and Critical Care Medicine Staff Physician, [place] Director - Interstitial Lung Disease  Program  Pulmonary [place] at [place], [place], [address]  Pager: [phone], If no answer or between  15:00h - 7:00h: call [phone] Telephone: [phone]  12:43 PM 11/01/2020

## 2020-11-07 DIAGNOSIS — E559 Vitamin D deficiency, unspecified: Secondary | ICD-10-CM | POA: Diagnosis not present

## 2020-11-07 DIAGNOSIS — M8580 Other specified disorders of bone density and structure, unspecified site: Secondary | ICD-10-CM | POA: Diagnosis not present

## 2020-11-07 DIAGNOSIS — M329 Systemic lupus erythematosus, unspecified: Secondary | ICD-10-CM | POA: Diagnosis not present

## 2020-11-07 DIAGNOSIS — M549 Dorsalgia, unspecified: Secondary | ICD-10-CM | POA: Diagnosis not present

## 2020-11-07 DIAGNOSIS — Z79899 Other long term (current) drug therapy: Secondary | ICD-10-CM | POA: Diagnosis not present

## 2020-11-07 DIAGNOSIS — M1990 Unspecified osteoarthritis, unspecified site: Secondary | ICD-10-CM | POA: Diagnosis not present

## 2020-11-09 ENCOUNTER — Telehealth: Payer: Self-pay

## 2020-11-09 ENCOUNTER — Telehealth: Payer: Self-pay | Admitting: Gastroenterology

## 2020-11-09 DIAGNOSIS — Z800 Family history of malignant neoplasm of digestive organs: Secondary | ICD-10-CM

## 2020-11-09 DIAGNOSIS — D175 Benign lipomatous neoplasm of intra-abdominal organs: Secondary | ICD-10-CM

## 2020-11-09 DIAGNOSIS — K219 Gastro-esophageal reflux disease without esophagitis: Secondary | ICD-10-CM

## 2020-11-09 DIAGNOSIS — R1011 Right upper quadrant pain: Secondary | ICD-10-CM

## 2020-11-09 DIAGNOSIS — K824 Cholesterolosis of gallbladder: Secondary | ICD-10-CM

## 2020-11-09 NOTE — Telephone Encounter (Signed)
Referral to Primary Care

## 2020-11-09 NOTE — Telephone Encounter (Signed)
Inbound call from patient stating they have not had a call from the referral we sent for her.  Please advise.

## 2020-11-09 NOTE — Telephone Encounter (Signed)
Jan, please see message below. Thanks

## 2020-11-11 ENCOUNTER — Other Ambulatory Visit: Payer: Self-pay | Admitting: Gastroenterology

## 2020-11-21 ENCOUNTER — Telehealth: Payer: Self-pay | Admitting: *Deleted

## 2020-11-21 NOTE — Telephone Encounter (Signed)
Dr. Havery Moros,  This pt is coming for a PV on 12-04-20; her EGD is 12-18-20.  She saw you in the office on 10-09-20.  She did see her pulmonologist on 11-01-20 for atypical chest pain. Her ECHO on 10-30-20 showed an EF of 60 to 65%, no aortic valve stenosis.  Her pulmonologist wants her to F/U with you and rheumatologist- he did not order any further cardiac tests or refer her to cardiology.  I do not see any messages in her chart about further chest pain.  Is she ok to proceed at the [facility]?  Thanks, [employer]

## 2020-11-22 NOTE — Telephone Encounter (Signed)
Thanks [employer]. I think okay to proceed at [facility] based on chart review, Jenny Reichmann do you agree? Thanks

## 2020-11-27 DIAGNOSIS — R609 Edema, unspecified: Secondary | ICD-10-CM | POA: Diagnosis not present

## 2020-11-27 DIAGNOSIS — E559 Vitamin D deficiency, unspecified: Secondary | ICD-10-CM | POA: Diagnosis not present

## 2020-11-27 DIAGNOSIS — L930 Discoid lupus erythematosus: Secondary | ICD-10-CM | POA: Diagnosis not present

## 2020-11-27 DIAGNOSIS — K219 Gastro-esophageal reflux disease without esophagitis: Secondary | ICD-10-CM | POA: Diagnosis not present

## 2020-12-04 ENCOUNTER — Other Ambulatory Visit: Payer: Self-pay

## 2020-12-04 ENCOUNTER — Ambulatory Visit (AMBULATORY_SURGERY_CENTER): Payer: Medicare Other | Admitting: *Deleted

## 2020-12-04 VITALS — Ht 61.0 in | Wt 160.0 lb

## 2020-12-04 DIAGNOSIS — D175 Benign lipomatous neoplasm of intra-abdominal organs: Secondary | ICD-10-CM

## 2020-12-04 NOTE — Progress Notes (Signed)

## 2020-12-08 ENCOUNTER — Encounter (HOSPITAL_COMMUNITY): Payer: Self-pay

## 2020-12-08 ENCOUNTER — Emergency Department (HOSPITAL_COMMUNITY)
Admission: EM | Admit: 2020-12-08 | Discharge: 2020-12-08 | Disposition: A | Discharge status: Home / Self Care | Payer: Medicare Other | Attending: Emergency Medicine | Admitting: Emergency Medicine

## 2020-12-08 ENCOUNTER — Emergency Department (HOSPITAL_COMMUNITY): Payer: Medicare Other

## 2020-12-08 ENCOUNTER — Telehealth: Payer: Self-pay | Admitting: Gastroenterology

## 2020-12-08 DIAGNOSIS — M545 Low back pain, unspecified: Secondary | ICD-10-CM

## 2020-12-08 DIAGNOSIS — M5459 Other low back pain: Secondary | ICD-10-CM | POA: Diagnosis not present

## 2020-12-08 DIAGNOSIS — K449 Diaphragmatic hernia without obstruction or gangrene: Secondary | ICD-10-CM | POA: Diagnosis not present

## 2020-12-08 DIAGNOSIS — K219 Gastro-esophageal reflux disease without esophagitis: Secondary | ICD-10-CM | POA: Insufficient documentation

## 2020-12-08 DIAGNOSIS — Z96653 Presence of artificial knee joint, bilateral: Secondary | ICD-10-CM | POA: Insufficient documentation

## 2020-12-08 DIAGNOSIS — S22070A Wedge compression fracture of T9-T10 vertebra, initial encounter for closed fracture: Secondary | ICD-10-CM | POA: Diagnosis not present

## 2020-12-08 DIAGNOSIS — R079 Chest pain, unspecified: Secondary | ICD-10-CM | POA: Diagnosis not present

## 2020-12-08 DIAGNOSIS — N281 Cyst of kidney, acquired: Secondary | ICD-10-CM | POA: Diagnosis not present

## 2020-12-08 DIAGNOSIS — R1011 Right upper quadrant pain: Secondary | ICD-10-CM | POA: Diagnosis not present

## 2020-12-08 DIAGNOSIS — D171 Benign lipomatous neoplasm of skin and subcutaneous tissue of trunk: Secondary | ICD-10-CM | POA: Diagnosis not present

## 2020-12-08 LAB — URINALYSIS, ROUTINE W REFLEX MICROSCOPIC
Bilirubin Urine: NEGATIVE
Glucose, UA: NEGATIVE mg/dL
Hgb urine dipstick: NEGATIVE
Ketones, ur: NEGATIVE mg/dL
Nitrite: NEGATIVE
Protein, ur: NEGATIVE mg/dL
Specific Gravity, Urine: 1.02 (ref 1.005–1.030)
pH: 5 (ref 5.0–8.0)

## 2020-12-08 LAB — CBC WITH DIFFERENTIAL/PLATELET
Abs Immature Granulocytes: 0.03 10*3/uL (ref 0.00–0.07)
Basophils Absolute: 0 10*3/uL (ref 0.0–0.1)
Basophils Relative: 0 %
Eosinophils Absolute: 0.2 10*3/uL (ref 0.0–0.5)
Eosinophils Relative: 2 %
HCT: 46.6 % — ABNORMAL HIGH (ref 36.0–46.0)
Hemoglobin: 14.4 g/dL (ref 12.0–15.0)
Immature Granulocytes: 0 %
Lymphocytes Relative: 29 %
Lymphs Abs: 3.2 10*3/uL (ref 0.7–4.0)
MCH: 28.9 pg (ref 26.0–34.0)
MCHC: 30.9 g/dL (ref 30.0–36.0)
MCV: 93.6 fL (ref 80.0–100.0)
Monocytes Absolute: 0.9 10*3/uL (ref 0.1–1.0)
Monocytes Relative: 8 %
Neutro Abs: 6.5 10*3/uL (ref 1.7–7.7)
Neutrophils Relative %: 61 %
Platelets: 172 10*3/uL (ref 150–400)
RBC: 4.98 MIL/uL (ref 3.87–5.11)
RDW: 15.1 % (ref 11.5–15.5)
WBC: 10.8 10*3/uL — ABNORMAL HIGH (ref 4.0–10.5)
nRBC: 0 % (ref 0.0–0.2)

## 2020-12-08 LAB — TROPONIN I (HIGH SENSITIVITY)
Troponin I (High Sensitivity): 11 ng/L (ref <18)
Troponin I (High Sensitivity): 7 ng/L (ref <18)

## 2020-12-08 LAB — COMPREHENSIVE METABOLIC PANEL
ALT: 17 U/L (ref 0–44)
AST: 22 U/L (ref 15–41)
Albumin: 3.7 g/dL (ref 3.5–5.0)
Alkaline Phosphatase: 66 U/L (ref 38–126)
Anion gap: 6 (ref 5–15)
BUN: 14 mg/dL (ref 8–23)
CO2: 28 mmol/L (ref 22–32)
Calcium: 9.2 mg/dL (ref 8.9–10.3)
Chloride: 108 mmol/L (ref 98–111)
Creatinine, Ser: 0.65 mg/dL (ref 0.44–1.00)
GFR, Estimated: >60 mL/min (ref >60)
Glucose, Bld: 73 mg/dL (ref 70–99)
Potassium: 4.1 mmol/L (ref 3.5–5.1)
Sodium: 142 mmol/L (ref 135–145)
Total Bilirubin: 0.8 mg/dL (ref 0.3–1.2)
Total Protein: 7.7 g/dL (ref 6.5–8.1)

## 2020-12-08 LAB — LIPASE, BLOOD: Lipase: 32 U/L (ref 11–51)

## 2020-12-08 MED ORDER — TIZANIDINE HCL 4 MG PO TABS: 4.0000 mg | ORAL_TABLET | Freq: Four times a day (QID) | ORAL | 0 refills | Status: AC | PRN

## 2020-12-08 MED ORDER — ONDANSETRON HCL 4 MG/2ML IJ SOLN
4.0000 mg | Freq: Once | INTRAMUSCULAR | Status: AC
  Administered 2020-12-08: 4 mg via INTRAVENOUS
  Filled 2020-12-08: qty 2

## 2020-12-08 MED ORDER — MORPHINE SULFATE (PF) 4 MG/ML IV SOLN
4.0000 mg | Freq: Once | INTRAVENOUS | Status: AC
  Administered 2020-12-08: 4 mg via INTRAVENOUS
  Filled 2020-12-08: qty 1

## 2020-12-08 NOTE — ED Provider Notes (Signed)
Emergency Department Provider Note   I have reviewed the triage vital signs and the nursing notes.   HISTORY  Chief Complaint Back Pain   HPI Jane Chavez is a 75 y.o. female with past medical history of lupus presents to the emergency department with right-sided back pain radiating around to the right upper quadrant.  Symptoms have been present for the past 30 days.  She states initially she had mainly right upper quadrant abdominal pain but now the pain is going to her back.  Pain is made worse with movement and is at times severe.  She denies any pleuritic chest pain.  She is not having fevers or chills.  No productive cough.  Pain is not worse with eating.  She does have a gastric lipoma and is followed by GI with planned EGD in the coming month.  She is not having any numbness or weakness into the arms or legs.  No dysuria, hesitancy, urgency.  She has been given steroid, over-the-counter pain medications, Flexeril which he has been using with no relief in symptoms.  Past Medical History:  Diagnosis Date   Arthritis    Back stiffness 03/13/2017   Pt.'s Dr. thinks it is Lupus related.   Cataract    removed both eyes   Chronic abdominal pain    Diverticulosis    Gallbladder polyp    GERD (gastroesophageal reflux disease)    History of colon polyps    HLD (hyperlipidemia)    Lipoma of stomach    Lupus ([place]) August 27, 2012   Pleural effusion 09/2015    Patient Active Problem List   Diagnosis Date Noted   OA (osteoarthritis) of knee 04/24/2020   Primary osteoarthritis of left knee 04/24/2020   Abnormal CT of the abdomen    Abdominal pain, epigastric    SOB (shortness of breath)    Acute respiratory failure with hypoxia ([place]) 10/03/2015   HLD (hyperlipidemia) 10/03/2015   Left ventricular diastolic dysfunction, NYHA class 2 10/03/2015   SLE (systemic lupus erythematosus) ([place]) 10/03/2015   Enlarged thyroid gland 10/03/2015   Recurrent pleural effusion on left    Bilateral  pleural effusion (Recurrent) 09/21/2015   GERD (gastroesophageal reflux disease) 09/21/2015    Past Surgical History:  Procedure Laterality Date   BREAST LUMPECTOMY Right    CATARACT EXTRACTION, BILATERAL     ESOPHAGOGASTRODUODENOSCOPY N/A 09/09/2017   Procedure: ESOPHAGOGASTRODUODENOSCOPY (EGD);  Surgeon: Yetta Flock, MD;  Location: [facility];  Service: Gastroenterology;  Laterality: N/A;   REPLACEMENT TOTAL KNEE Right    TOTAL KNEE ARTHROPLASTY Left 04/24/2020   Procedure: TOTAL KNEE ARTHROPLASTY;  Surgeon: Gaynelle Arabian, MD;  Location: [facility];  Service: Orthopedics;  Laterality: Left;  43min   UPPER GASTROINTESTINAL ENDOSCOPY      Allergies Penicillins  Family History  Problem Relation Age of Onset   Asthma Mother    Colon cancer Sister    Colon polyps Brother    Other Brother        colon surgery, growth removed   Colon cancer Other        niece, deceased 08-27-2016   Esophageal cancer Neg Hx    Rectal cancer Neg Hx    Stomach cancer Neg Hx     Social History Social History   Tobacco Use   Smoking status: Never   Smokeless tobacco: Never  Vaping Use   Vaping Use: Never used  Substance Use Topics   Alcohol use: No   Drug use: No  Review of Systems  Constitutional: No fever/chills Eyes: No visual changes. ENT: No sore throat. Cardiovascular: Denies chest pain. Respiratory: Denies shortness of breath. Gastrointestinal: No abdominal pain.  No nausea, no vomiting.  No diarrhea.  No constipation. Genitourinary: Negative for dysuria. Musculoskeletal: Positive for back pain. Skin: Negative for rash. Neurological: Negative for headaches, focal weakness or numbness.  10-point ROS otherwise negative.  ____________________________________________   PHYSICAL EXAM:  VITAL SIGNS: ED Triage Vitals [12/08/20 1002]  Enc Vitals Group     BP (!) 141/82     Pulse Rate 72     Resp 16     Temp 98.1 F (36.7 C)     Temp Source Oral     SpO2 99 %     Constitutional: Alert and oriented. Well appearing and in no acute distress. Eyes: Conjunctivae are normal. Head: Atraumatic. Nose: No congestion/rhinnorhea. Mouth/Throat: Mucous membranes are moist.  Neck: No stridor.  Cardiovascular: Normal rate, regular rhythm. Good peripheral circulation. Grossly normal heart sounds.   Respiratory: Normal respiratory effort.  No retractions. Lungs CTAB. Gastrointestinal: Soft and nontender. No RUQ abdominal pain. No distention.  Musculoskeletal: No lower extremity tenderness nor edema. No gross deformities of extremities.  Tenderness along the right lateral chest wall and back in the paraspinal region on the right.  Pain is reproducible.  No rash.  Neurologic:  Normal speech and language. No gross focal neurologic deficits are appreciated.  Skin:  Skin is warm, dry and intact. No rash noted.   ____________________________________________   LABS (all labs ordered are listed, but only abnormal results are displayed)  Labs Reviewed  CBC WITH DIFFERENTIAL/PLATELET - Abnormal; Notable for the following components:      Result Value   WBC 10.8 (*)    HCT 46.6 (*)    All other components within normal limits  URINALYSIS, ROUTINE W REFLEX MICROSCOPIC - Abnormal; Notable for the following components:   Leukocytes,Ua TRACE (*)    Bacteria, UA RARE (*)    All other components within normal limits  URINE CULTURE  COMPREHENSIVE METABOLIC PANEL  LIPASE, BLOOD  TROPONIN I (HIGH SENSITIVITY)  TROPONIN I (HIGH SENSITIVITY)   ____________________________________________  RADIOLOGY  DG Chest 2 View  Result Date: 12/08/2020 CLINICAL DATA:  Chest pain EXAM: CHEST - 2 VIEW COMPARISON:  10/27/2020, 09/16/2019 FINDINGS: Low lung volumes. No focal opacity or pleural effusion. Stable cardiomediastinal silhouette. No pneumothorax. IMPRESSION: No active cardiopulmonary disease. Electronically Signed   By: Donavan Foil M.D.   On: 12/08/2020 18:10   CT Renal  Stone Study  Result Date: 12/08/2020 CLINICAL DATA:  Middle back pain x1 month. EXAM: CT ABDOMEN AND PELVIS WITHOUT CONTRAST TECHNIQUE: Multidetector CT imaging of the abdomen and pelvis was performed following the standard protocol without IV contrast. COMPARISON:  Oct 17, 2020 FINDINGS: Lower chest: Very mild atelectasis is seen within the bilateral lung bases. Hepatobiliary: No focal liver abnormality is seen. No gallstones, gallbladder wall thickening, or biliary dilatation. Pancreas: Unremarkable. No pancreatic ductal dilatation or surrounding inflammatory changes. Spleen: Normal in size without focal abnormality. Adrenals/Urinary Tract: Adrenal glands are unremarkable. Kidneys are normal in size, without renal calculi or hydronephrosis. A stable 1.2 cm diameter cyst is seen within the lower pole of the right kidney. Bladder is unremarkable. Stomach/Bowel: There is a small hiatal hernia. A stable 4.0 cm x 2.4 cm gastric lipoma is seen within the region of the gastric antrum (axial CT image 24, CT series 2). Appendix appears normal. No evidence of bowel wall thickening,  distention, or inflammatory changes. Vascular/Lymphatic: Aortic atherosclerosis. No enlarged abdominal or pelvic lymph nodes. Reproductive: Uterus and bilateral adnexa are unremarkable. Other: No abdominal wall hernia or abnormality. No abdominopelvic ascites. Musculoskeletal: A chronic compression fracture deformity is seen at the level of T10. Degenerative changes are noted within the lumbar spine. IMPRESSION: 1. No acute or active process within the abdomen or pelvis. 2. Stable gastric lipoma. 3. Simple right renal cyst. Electronically Signed   By: Virgina Norfolk M.D.   On: 12/08/2020 16:50    ____________________________________________   PROCEDURES  Procedure(s) performed:   Procedures  None  ____________________________________________   INITIAL IMPRESSION / ASSESSMENT AND PLAN / ED COURSE  Pertinent labs & imaging  results that were available during my care of the patient were reviewed by me and considered in my medical decision making (see chart for details).   Patient presents emergency department with right flank pain.  Pain seems musculoskeletal with pain worse with movement and touching the area.  There is no rash to suspect zoster.  She has no focal right upper quadrant abdominal pain to suspect gallbladder pathology.  Pain seems somewhat radicular versus musculoskeletal.  She has imaging from May but was not having pain at that time.  Plan for CT renal along with chest x-ray.  Low suspicion for atypical ACS but will obtain EKG along with troponins.  Plan for pain management.  She is scheduled to see a spine team next week according to the patient.   Labs and imaging reviewed with no acute findings.  Troponin is negative.  Rare bacteria on UA.  Plan to send this for urine culture but patient not having symptoms to prompt treatment.  CT renal shows no acute findings.  Chest x-ray similarly clear.  Plan for Zanaflex for likely musculoskeletal back pain.  She has an appointment with her spine team on Tuesday.  No exam findings or historical features to prompt emergent MRI in the ER. Discussed ED return precautions.  ____________________________________________  FINAL CLINICAL IMPRESSION(S) / ED DIAGNOSES  Final diagnoses:  Acute right-sided low back pain without sciatica     MEDICATIONS GIVEN DURING THIS VISIT:  Medications  morphine 4 MG/ML injection 4 mg (4 mg Intravenous Given 12/08/20 1608)  ondansetron (ZOFRAN) injection 4 mg (4 mg Intravenous Given 12/08/20 1610)     NEW OUTPATIENT MEDICATIONS STARTED DURING THIS VISIT:  Discharge Medication List as of 12/08/2020  9:37 PM     START taking these medications   Details  tiZANidine (ZANAFLEX) 4 MG tablet Take 1 tablet (4 mg total) by mouth every 6 (six) hours as needed for muscle spasms., Starting Fri 12/08/2020, Normal        Note:  This  document was prepared using Dragon voice recognition software and may include unintentional dictation errors.  Nanda Quinton, MD, [facility] Emergency Medicine    Aileene Lanum, [facility], MD 12/08/20 [phone]

## 2020-12-08 NOTE — Telephone Encounter (Signed)
Patient calling to inform she is at [facility] ED. Pt states she wants GI provider aware. Pt states she is at the hosp for abd pain.. Plz advise  thank you

## 2020-12-08 NOTE — Telephone Encounter (Signed)
Noted  

## 2020-12-08 NOTE — ED Triage Notes (Signed)
Pt arrived via back pain x1 month, worsening through the month. States unsure if it was due to lupus, has seen rheumatologist who tried increase in steroids with no relief.

## 2020-12-08 NOTE — Discharge Instructions (Addendum)
You were seen in the emerge department today with back pain.  Your labs and CT imaging are reassuring.  You do have an old compression fracture at T10 which she can discuss with your spine doctor on Tuesday.  I am not sure this is causing your pain or if this is unrelated but I have called in a muscle relaxer to help you with symptoms.  This can cause some drowsiness.  Do not take this with Percocet or other muscle relaxing medications.  Do not drive a car while taking this medicine.  Return to the emergency department any new or suddenly worsening symptoms.

## 2020-12-08 NOTE — ED Provider Notes (Signed)
Emergency Medicine Provider Triage Evaluation Note  Jane Chavez , a 75 y.o. female  was evaluated in triage.  Pt complains of back pain 1 month. She has been seen by her PCP/pulmonologist and GI doctor. States she was given prednisone for her back pain with no relief.  States achy severe sharp and constant.  No mitigating factors. Worse with movement.   Broad differential for back pain considered includes malignancy, disc herniation, spinal epidural abscess, spinal fracture, cauda equina, pyelonephritis, kidney stone, AAA, AD, pancreatitis, PE and PTX.   History without symptoms of urinary or stool retention or incontinence, neurologic changes such as sensation change or weakness lower extremities, coagulopathy or blood thinner use, is not elderly or with history of osteoporosis, denies any history of cancer, fever, IV drug use, weight changes (unexplained), or prolonged steroid use.    Review of Systems  Positive: Back pain  Negative: Fever  Physical Exam  BP (!) 141/82 (BP Location: Left Arm)   Pulse 72   Temp 98.1 F (36.7 C) (Oral)   Resp 16   SpO2 99%  Gen:   Awake, uncomfortable Resp:  Normal effort MSK:   Moves extremities without difficulty  Other:    Medical Decision Making  Medically screening exam initiated at 12:00 PM.  Appropriate orders placed.  Jane Chavez was informed that the remainder of the evaluation will be completed by another provider, this initial triage assessment does not replace that evaluation, and the importance of remaining in the ED until their evaluation is complete.     Jane Chavez [place], [place] 12/08/20 1203    Sherwood Gambler, MD 12/09/20 1538

## 2020-12-08 NOTE — ED Notes (Signed)
Pt ambulated to bathroom with 1 person assist.

## 2020-12-10 LAB — URINE CULTURE: Culture: >=100000 — AB

## 2020-12-11 ENCOUNTER — Telehealth: Payer: Self-pay | Admitting: Emergency Medicine

## 2020-12-11 NOTE — Telephone Encounter (Signed)
Post ED Visit - Positive Culture Follow-up  Culture report reviewed by antimicrobial stewardship pharmacist: [facility] Team []  Elenor Quinones, Pharm.D. []  Heide Guile, Pharm.D., BCPS AQ-ID []  Parks Neptune, Pharm.D., BCPS []  Alycia Rossetti, Pharm.D., BCPS []  McAllister, Pharm.D., BCPS, AAHIVP []  Legrand Como, Pharm.D., BCPS, AAHIVP []  Salome Arnt, PharmD, BCPS []  Johnnette Gourd, PharmD, BCPS []  Hughes Better, PharmD, BCPS []  Leeroy Cha, PharmD []  Laqueta Linden, PharmD, BCPS []  Albertina Parr, PharmD  [place] Team []  Leodis Sias, PharmD []  Lindell Spar, PharmD []  Royetta Asal, PharmD [x]  Graylin Shiver, Rph []  Rema Fendt) Glennon Mac, PharmD []  Arlyn Dunning, PharmD []  Netta Cedars, PharmD []  Dia Sitter, PharmD []  Leone Haven, PharmD []  Gretta Arab, PharmD []  Theodis Shove, PharmD []  Peggyann Juba, PharmD []  Reuel Boom, PharmD   Positive urine culture Treated with none, asymptomatic, no further patient follow-up is required at this time.  Hazle Nordmann 12/11/2020, 4:44 PM

## 2020-12-12 DIAGNOSIS — M5416 Radiculopathy, lumbar region: Secondary | ICD-10-CM | POA: Diagnosis not present

## 2020-12-12 DIAGNOSIS — M546 Pain in thoracic spine: Secondary | ICD-10-CM | POA: Diagnosis not present

## 2020-12-12 DIAGNOSIS — M545 Low back pain, unspecified: Secondary | ICD-10-CM | POA: Diagnosis not present

## 2020-12-12 DIAGNOSIS — N390 Urinary tract infection, site not specified: Secondary | ICD-10-CM | POA: Diagnosis not present

## 2020-12-17 ENCOUNTER — Encounter: Payer: Self-pay | Admitting: Certified Registered Nurse Anesthetist

## 2020-12-18 ENCOUNTER — Ambulatory Visit (AMBULATORY_SURGERY_CENTER): Payer: Medicare Other | Admitting: Gastroenterology

## 2020-12-18 ENCOUNTER — Other Ambulatory Visit: Payer: Self-pay

## 2020-12-18 ENCOUNTER — Encounter: Payer: Self-pay | Admitting: Gastroenterology

## 2020-12-18 VITALS — BP 135/78 | HR 88 | Temp 97.3°F | Resp 23 | Ht 61.0 in | Wt 160.0 lb

## 2020-12-18 DIAGNOSIS — K2100 Gastro-esophageal reflux disease with esophagitis, without bleeding: Secondary | ICD-10-CM

## 2020-12-18 DIAGNOSIS — R1010 Upper abdominal pain, unspecified: Secondary | ICD-10-CM

## 2020-12-18 DIAGNOSIS — K449 Diaphragmatic hernia without obstruction or gangrene: Secondary | ICD-10-CM | POA: Diagnosis not present

## 2020-12-18 DIAGNOSIS — K209 Esophagitis, unspecified without bleeding: Secondary | ICD-10-CM

## 2020-12-18 DIAGNOSIS — K219 Gastro-esophageal reflux disease without esophagitis: Secondary | ICD-10-CM | POA: Diagnosis not present

## 2020-12-18 DIAGNOSIS — E7800 Pure hypercholesterolemia, unspecified: Secondary | ICD-10-CM | POA: Diagnosis not present

## 2020-12-18 DIAGNOSIS — D175 Benign lipomatous neoplasm of intra-abdominal organs: Secondary | ICD-10-CM | POA: Diagnosis not present

## 2020-12-18 DIAGNOSIS — K3189 Other diseases of stomach and duodenum: Secondary | ICD-10-CM | POA: Diagnosis not present

## 2020-12-18 DIAGNOSIS — R109 Unspecified abdominal pain: Secondary | ICD-10-CM | POA: Diagnosis not present

## 2020-12-18 MED ORDER — OMEPRAZOLE 40 MG PO CPDR: 40.0000 mg | DELAYED_RELEASE_CAPSULE | Freq: Every day | ORAL | 3 refills | Status: DC

## 2020-12-18 MED ORDER — SODIUM CHLORIDE 0.9 % IV SOLN: 500.0000 mL | INTRAVENOUS | Status: DC

## 2020-12-18 NOTE — Progress Notes (Signed)
Vs CW I have reviewed the patient's medical history in detail and updated the computerized patient record.   

## 2020-12-18 NOTE — Progress Notes (Signed)
Called to room to assist during endoscopic procedure.  Patient ID and intended procedure confirmed with present staff. Received instructions for my participation in the procedure from the performing physician.  

## 2020-12-18 NOTE — Progress Notes (Signed)
No problems noted in the recovery room. Pt was HIPPA.  Pt was awake when Dr. Havery Moros went over the findings with the pt and when I went over avs info. maw

## 2020-12-18 NOTE — Op Note (Signed)
[place] Patient Name: Jane Chavez Procedure Date: 12/18/2020 10:31 AM MRN: [id number] Endoscopist: Remo Lipps P. Havery Moros , MD Age: 75 Referring MD:  Date of Birth: 09/17/1945 Gender: Female Account #: [id number] Procedure:                Upper GI endoscopy Indications:              Upper abdominal pain, history of gastric lipoma                            which has increased in size over time on imaging.                            No other cause for pain noted on CT scan of RUQ [place],                            on pepcid for GERD Medicines:                Monitored Anesthesia Care Procedure:                Pre-Anesthesia Assessment:                           - Prior to the procedure, a History and Physical                            was performed, and patient medications and                            allergies were reviewed. The patient's tolerance of                            previous anesthesia was also reviewed. The risks                            and benefits of the procedure and the sedation                            options and risks were discussed with the patient.                            All questions were answered, and informed consent                            was obtained. Prior Anticoagulants: The patient has                            taken no previous anticoagulant or antiplatelet                            agents. ASA Grade Assessment: III - A patient with                            severe systemic disease. After reviewing the risks  and benefits, the patient was deemed in                            satisfactory condition to undergo the procedure.                           After obtaining informed consent, the endoscope was                            passed under direct vision. Throughout the                            procedure, the patient's blood pressure, pulse, and                            oxygen saturations were  monitored continuously. The                            GIF [id number] #[id number] was introduced through the                            mouth, and advanced to the second part of duodenum.                            The upper GI endoscopy was accomplished without                            difficulty. The patient tolerated the procedure                            well. Scope In: Scope Out: Findings:                 Esophagogastric landmarks were identified: the                            Z-line was found at 31 cm, the gastroesophageal                            junction was found at 31 cm and the upper extent of                            the gastric folds was found at 32 cm from the                            incisors.                           A 1 cm hiatal hernia was present.                           Mild esophagitis was found 31 cm from the incisors.                           The exam of  the esophagus was otherwise normal.                           There was a large lipoma in the gastric antrum, in                            close approximation to the pylorus but was not                            obstructing it. It appeared larger than the last                            time it was evaluated in 09/2017. Bite on bite                            biopsies were taken with a cold forceps for                            histology.                           The exam of the stomach was otherwise normal.                           Biopsies were taken with a cold forceps in the                            gastric body, at the incisura and in the gastric                            antrum for Helicobacter pylori testing.                           The duodenal bulb and second portion of the                            duodenum were normal. Complications:            No immediate complications. Estimated blood loss:                            Minimal. Estimated Blood Loss:     Estimated blood loss was  minimal. Impression:               - Esophagogastric landmarks identified.                           - 1 cm hiatal hernia.                           - Mild esophagitis.                           - Normal esophagus otherwise                           -  Gastric lipoma - increased in size compared to                            the last exam. Biopsied.                           - Normal stomach otherwise - biopsies taken to rule                            out H pylori                           - Normal duodenal bulb and second portion of the                            duodenum.                           Large gastric lipoma noted which has increased in                            size somewhat over time but is not obstructing the                            pylorus. Unclear if this is related to the                            patient's pain or not, it would be high risk for                            bleeding / perforation with endoscopic removal.                            Would treat esophagitis and see if any improvement                            in symptoms in the interim while biopsies pending Recommendation:           - Patient has a contact number available for                            emergencies. The signs and symptoms of potential                            delayed complications were discussed with the                            patient. Return to normal activities tomorrow.                            Written discharge instructions were provided to the                            patient.                           -  Resume previous diet.                           - Continue present medications.                           - Switch pepcid to omeprazole 40mg  / day for 4 weeks                           - Await pathology results with further                            recommendations Remo Lipps P. Favor Kreh, MD 12/18/2020 11:04:08 AM This report has been signed electronically.

## 2020-12-18 NOTE — Patient Instructions (Addendum)
Handouts were given to your care partner on a hiatal hernia and esophagitis. A prescription for OMEPRAZOLE 40 mg daily 20-30 minutes before breakfast # 30 refill x3 was sent to [facility] and the corner of [address].   You may resume your other current medications today. Await biopsy results.  May take 1-3 weeks to receive pathology results. Please call if any questions or concerns.    YOU HAD AN ENDOSCOPIC PROCEDURE TODAY AT [facility]:   Refer to the procedure report that was given to you for any specific questions about what was found during the examination.  If the procedure report does not answer your questions, please call your gastroenterologist to clarify.  If you requested that your care partner not be given the details of your procedure findings, then the procedure report has been included in a sealed envelope for you to review at your convenience later.  YOU SHOULD EXPECT: Some feelings of bloating in the abdomen. Passage of more gas than usual.  Walking can help get rid of the air that was put into your GI tract during the procedure and reduce the bloating. If you had a lower endoscopy (such as a colonoscopy or flexible sigmoidoscopy) you may notice spotting of blood in your stool or on the toilet paper. If you underwent a bowel prep for your procedure, you may not have a normal bowel movement for a few days.  Please Note:  You might notice some irritation and congestion in your nose or some drainage.  This is from the oxygen used during your procedure.  There is no need for concern and it should clear up in a day or so.  SYMPTOMS TO REPORT IMMEDIATELY:  Following upper endoscopy (EGD)  Vomiting of blood or coffee ground material  New chest pain or pain under the shoulder blades  Painful or persistently difficult swallowing  New shortness of breath  Fever of 100F or higher  Black, tarry-looking stools  For urgent or emergent issues, a  gastroenterologist can be reached at any hour by calling [phone]. Do not use MyChart messaging for urgent concerns.    DIET:  We do recommend a small meal at first, but then you may proceed to your regular diet.  Drink plenty of fluids but you should avoid alcoholic beverages for 24 hours.  ACTIVITY:  You should plan to take it easy for the rest of today and you should NOT DRIVE or use heavy machinery until tomorrow (because of the sedation medicines used during the test).    FOLLOW UP: Our staff will call the number listed on your records 48-72 hours following your procedure to check on you and address any questions or concerns that you may have regarding the information given to you following your procedure. If we do not reach you, we will leave a message.  We will attempt to reach you two times.  During this call, we will ask if you have developed any symptoms of COVID 19. If you develop any symptoms (ie: fever, flu-like symptoms, shortness of breath, cough etc.) before then, please call [phone].  If you test positive for Covid 19 in the 2 weeks post procedure, please call and report this information to [place].    If any biopsies were taken you will be contacted by phone or by letter within the next 1-3 weeks.  Please call us at [phone] if you have not heard about the biopsies in 3 weeks.  SIGNATURES/CONFIDENTIALITY: You and/or your care partner have signed paperwork which will be entered into your electronic medical record.  These signatures attest to the fact that that the information above on your After Visit Summary has been reviewed and is understood.  Full responsibility of the confidentiality of this discharge information lies with you and/or your care-partner.

## 2020-12-18 NOTE — Progress Notes (Signed)
1042 Robinul 0.1 mg IV given due large amount of secretions upon assessment.  MD made aware, vss  

## 2020-12-18 NOTE — Progress Notes (Signed)
Report given to PACU, vss 

## 2020-12-20 ENCOUNTER — Telehealth: Payer: Self-pay

## 2020-12-20 NOTE — Telephone Encounter (Signed)
  Follow up Call-  Call back number 12/18/2020  Post procedure Call Back phone  # [phone]  Permission to leave phone message Yes  Some recent data might be hidden     Patient questions:  Do you have a fever, pain , or abdominal swelling? No. Pain Score  0 *  Have you tolerated food without any problems? Yes.    Have you been able to return to your normal activities? Yes.    Do you have any questions about your discharge instructions: Diet   No. Medications  No. Follow up visit  No.  Do you have questions or concerns about your Care? No.  Actions: * If pain score is 4 or above: No action needed, pain <4.  Have you developed a fever since your procedure? no  2.   Have you had an respiratory symptoms (SOB or cough) since your procedure? no  3.   Have you tested positive for COVID 19 since your procedure no  4.   Have you had any family members/close contacts diagnosed with the COVID 19 since your procedure?  no   If yes to any of these questions please route to Joylene John, RN and Joella Prince, RN

## 2020-12-20 NOTE — Telephone Encounter (Signed)
  Follow up Call-  Call back number 12/18/2020  Post procedure Call Back phone  # [phone]  Permission to leave phone message Yes  Some recent data might be hidden     Patient questions:  Do you have a fever, pain , or abdominal swelling? No. Pain Score  0 *  Have you tolerated food without any problems? Yes.    Have you been able to return to your normal activities? Yes.    Do you have any questions about your discharge instructions: Diet   No. Medications  No. Follow up visit  No.  Do you have questions or concerns about your Care? No.  Actions: * If pain score is 4 or above: No action needed, pain <4.

## 2020-12-30 DIAGNOSIS — M5414 Radiculopathy, thoracic region: Secondary | ICD-10-CM | POA: Diagnosis not present

## 2020-12-30 DIAGNOSIS — M546 Pain in thoracic spine: Secondary | ICD-10-CM | POA: Diagnosis not present

## 2021-01-08 DIAGNOSIS — M546 Pain in thoracic spine: Secondary | ICD-10-CM | POA: Diagnosis not present

## 2021-01-10 ENCOUNTER — Ambulatory Visit: Payer: Self-pay | Admitting: Orthopedic Surgery

## 2021-01-11 DIAGNOSIS — Z79899 Other long term (current) drug therapy: Secondary | ICD-10-CM | POA: Diagnosis not present

## 2021-01-11 DIAGNOSIS — Z136 Encounter for screening for cardiovascular disorders: Secondary | ICD-10-CM | POA: Diagnosis not present

## 2021-01-11 DIAGNOSIS — K219 Gastro-esophageal reflux disease without esophagitis: Secondary | ICD-10-CM | POA: Diagnosis not present

## 2021-01-11 DIAGNOSIS — L930 Discoid lupus erythematosus: Secondary | ICD-10-CM | POA: Diagnosis not present

## 2021-01-11 DIAGNOSIS — E041 Nontoxic single thyroid nodule: Secondary | ICD-10-CM | POA: Diagnosis not present

## 2021-01-11 DIAGNOSIS — M545 Low back pain, unspecified: Secondary | ICD-10-CM | POA: Diagnosis not present

## 2021-01-11 DIAGNOSIS — M5136 Other intervertebral disc degeneration, lumbar region: Secondary | ICD-10-CM | POA: Diagnosis not present

## 2021-01-23 ENCOUNTER — Ambulatory Visit: Payer: Self-pay | Admitting: Orthopedic Surgery

## 2021-01-26 ENCOUNTER — Ambulatory Visit (INDEPENDENT_AMBULATORY_CARE_PROVIDER_SITE_OTHER): Payer: Medicare Other | Admitting: Gastroenterology

## 2021-01-26 ENCOUNTER — Encounter: Payer: Self-pay | Admitting: Gastroenterology

## 2021-01-26 VITALS — BP 126/84 | HR 80 | Ht <= 58 in | Wt 156.5 lb

## 2021-01-26 DIAGNOSIS — G8929 Other chronic pain: Secondary | ICD-10-CM

## 2021-01-26 DIAGNOSIS — R1011 Right upper quadrant pain: Secondary | ICD-10-CM

## 2021-01-26 DIAGNOSIS — D1779 Benign lipomatous neoplasm of other sites: Secondary | ICD-10-CM | POA: Diagnosis not present

## 2021-01-26 DIAGNOSIS — Z800 Family history of malignant neoplasm of digestive organs: Secondary | ICD-10-CM

## 2021-01-26 DIAGNOSIS — M549 Dorsalgia, unspecified: Secondary | ICD-10-CM | POA: Diagnosis not present

## 2021-01-26 DIAGNOSIS — K824 Cholesterolosis of gallbladder: Secondary | ICD-10-CM | POA: Diagnosis not present

## 2021-01-26 NOTE — Patient Instructions (Addendum)
If you are age 73 or older, your body mass index should be between 23-30. Your Body mass index is 32.71 kg/m. If this is out of the aforementioned range listed, please consider follow up with your Primary Care Provider.  If you are age 28 or younger, your body mass index should be between 19-25. Your Body mass index is 32.71 kg/m. If this is out of the aformentioned range listed, please consider follow up with your Primary Care Provider.   __________________________________________________________  The [facility] providers would like to encourage you to use [facility] to communicate with providers for non-urgent requests or questions.  Due to long hold times on the telephone, sending your provider a message by [facility] may be a faster and more efficient way to get a response.  Please allow 48 business hours for a response.  Please remember that this is for non-urgent requests.   We will await the results of your back surgery and hope it is helpful for your abdominal pain.  We have scheduled you for a Follow up appointment on Tuesday, 11-4 at 9:00 am.   Thank you for entrusting me with your care and for choosing [facility], Dr. Colfax Cellar

## 2021-01-26 NOTE — Progress Notes (Signed)
HPI :  75 year old female here for a follow-up visit for abdominal pain.  She has been seen in the past for this, history of a large gastric lipoma as well as gallbladder polyps.  Recall she has had prior ultrasounds of her right upper quadrant showing no gallstones but, small gallbladder polyps.  She has had CT scan for this in 2018 showing a gastric lipoma.  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    Since have last seen her she has had another CT scan and endoscopy as outlined below.  CT scan showed some interval enlargement of the gastric lipoma to 4 cm and endoscopy confirmed the lesion appears slightly bigger.  She had a subsequent CT renal stone study in July which showed stable appearance of the gastric lipoma.  She continues to have pain in her right upper quadrant which is present most of the time.  She is not sure if it may radiate from her back.  She has been having a lot of mid back pain and is undergoing kyphoplasty on September 1.  She wonders if this will help her right upper quadrant.  She denies any prandial relationship to her pain.  Rates it 7 out of 10 at times.  She denies any problems with her bowels or blood in her stools.    Prior workup: Colonoscopy 04/03/2017 - diverticulosis, 2 small polyps removed - one adenoma, recall 5 years for strong family history of colon cancer US abdomen 12/06/2014 - normal gallbladder, no gallstones CT scan 06/06/2017 - suspected gastric lipoma, otherwise unremarkable exam EGD 09/09/2017 - 3cm HH, large lipoma of the antrum, biopsies negative for HP   RUQ [place] 10/06/19 - FINDINGS: Gallbladder: No gallstones or wall thickening visualized. A 4 mm non-mobile polyp is incidentally noted. No sonographic Murphy sign noted by sonographer. Common bile duct: Diameter: 4 mm, within normal limits. Liver: No focal lesion identified. Within normal limits in parenchymal echogenicity. Portal vein is  patent on color Doppler imaging with normal direction of blood flow towards the liver. Other: None. IMPRESSION: No evidence of gallstones or other significant hepatobiliary abnormality     RUQ [place] 09/28/20: FINDINGS: Gallbladder: No gallstone, gallbladder wall thickening, or pericholecystic fluid. Negative sonographic Murphy's sign. There is a 5 mm gallbladder polyp. Common bile duct: Diameter: 5 mm Liver: No focal lesion identified. Within normal limits in parenchymal echogenicity. Portal vein is patent on color Doppler imaging with normal direction of blood flow towards the liver. Other: None. IMPRESSION: A 5 mm gallbladder polyp, otherwise unremarkable right upper quadrant ultrasound.    CT scan 10/17/20 - IMPRESSION: 1. No acute abnormality in the abdomen or pelvis. 2. Increased size of the 4.1 cm fat density lesion projecting into the gastric antrum, favored to represent a gastric lipoma. 3. Mild diffuse hepatic steatosis. 4. Moderate volume of formed stool throughout the colon. 5. Pelvic floor laxity with small cystocele. 6. Aortic atherosclerosis.   EGD 12/18/20 -  - A 1 cm hiatal hernia was present. - Mild esophagitis was found 31 cm from the incisors. - The exam of the esophagus was otherwise normal. - There was a large lipoma in the gastric antrum, in close approximation to the pylorus but was not obstructing it. It appeared larger than the last time it was evaluated in 09/2017. Bite on bite biopsies were taken with a cold forceps for histology. - The exam of the stomach was otherwise normal. - Biopsies  were taken with a cold forceps in the gastric body, at the incisura and in the gastric antrum for Helicobacter pylori testing. - The duodenal bulb and second portion of the duodenum were normal.   1. Surgical [P], gastric antrum and gastric body - ANTRAL AND OXYNTIC MUCOSA WITH HYPEREMIA. - NO LIPOMATOUS TISSUE, INTESTINAL METAPLASIA, DYSPLASIA, OR CARCINOMA. [place] NEGATIVE FOR HELICOBACTER PYLORI. 2. Surgical [P], gastric antrum and gastric body - ANTRAL AND OXYNTIC MUCOSA WITH SLIGHT HYPEREMIA. - WARTHIN-STARRY NEGATIVE FOR HELICOBACTER PYLORI. - NO INTESTINAL METAPLASIA, DYSPLASIA, OR CARCINOMA.   CT renal stone study 12/08/20 - IMPRESSION: 1. No acute or active process within the abdomen or pelvis. 2. Stable gastric lipoma. 3. Simple right renal cyst.    Past Medical History:  Diagnosis Date   Arthritis    Back stiffness 03/13/2017   Pt.'s Dr. thinks it is Lupus related.   Cataract    removed both eyes   Chronic abdominal pain    Diverticulosis    Gallbladder polyp    GERD (gastroesophageal reflux disease)    History of colon polyps    HLD (hyperlipidemia)    Lipoma of stomach    Lupus ([place]) 09/13/2012   Pleural effusion 09/2015     Past Surgical History:  Procedure Laterality Date   BREAST LUMPECTOMY Right    CATARACT EXTRACTION, BILATERAL     ESOPHAGOGASTRODUODENOSCOPY N/A 09/09/2017   Procedure: ESOPHAGOGASTRODUODENOSCOPY (EGD);  Surgeon: Yetta Flock, MD;  Location: [facility];  Service: Gastroenterology;  Laterality: N/A;   REPLACEMENT TOTAL KNEE Right    TOTAL KNEE ARTHROPLASTY Left 04/24/2020   Procedure: TOTAL KNEE ARTHROPLASTY;  Surgeon: Gaynelle Arabian, MD;  Location: [facility];  Service: Orthopedics;  Laterality: Left;  20mn   UPPER GASTROINTESTINAL ENDOSCOPY     Family History  Problem Relation Age of Onset   Asthma Mother    Colon cancer Sister    Colon polyps Brother    Other Brother        colon surgery, growth removed   Colon cancer Other        niece, deceased MApril 06, 2018  Esophageal cancer Neg Hx    Rectal cancer Neg Hx    Stomach cancer Neg Hx    Social History   Tobacco Use   Smoking status: Never   Smokeless tobacco: Never  Vaping Use   Vaping Use: Never used  Substance Use Topics   Alcohol use: No   Drug use: No   Current Outpatient Medications  Medication Sig Dispense  Refill   acetaminophen (TYLENOL) 500 MG tablet Take 1,000 mg by mouth every 6 (six) hours as needed for headache or moderate pain.     azaTHIOprine (IMURAN) 50 MG tablet Take 100 mg by mouth daily.   2   calcitonin, salmon, (MIACALCIN/FORTICAL) 200 UNIT/ACT nasal spray Place 1 spray into alternate nostrils daily.     carboxymethylcellul-glycerin (REFRESH OPTIVE) 0.5-0.9 % ophthalmic solution Place 1 drop into both eyes 2 (two) times daily as needed for dry eyes.     famotidine (PEPCID) 20 MG tablet TAKE 1-2 TABLETS (20-40 MG TOTAL) BY MOUTH DAILY. (Patient taking differently: Take 20 mg by mouth daily as needed for indigestion or heartburn.) 180 tablet 1   furosemide (LASIX) 80 MG tablet Take 1 tablet (80 mg total) by mouth daily. Please have PCP manage going forward. 30 tablet 0   hydroxychloroquine (PLAQUENIL) 200 MG tablet Take 1 tablet (200 mg total) by mouth daily. (Patient taking differently: Take  400 mg by mouth daily.) 30 tablet 0   KLOR-CON M20 20 MEQ tablet TAKE 1 TABLET (20 MEQ TOTAL) BY MOUTH DAILY. PLEASE HAVE PCP MANAGE GOING FORWARD. 30 tablet 0   omeprazole (PRILOSEC) 40 MG capsule Take 1 capsule (40 mg total) by mouth daily. Best to take on an empty stomach 20-30 minutes before breakfast. 30 capsule 3   oxyCODONE (OXY IR/ROXICODONE) 5 MG immediate release tablet Take 5 mg by mouth every 6 (six) hours as needed for moderate pain.     predniSONE (DELTASONE) 5 MG tablet Take 5 mg by mouth daily with breakfast.     tiZANidine (ZANAFLEX) 4 MG tablet Take 1 tablet (4 mg total) by mouth every 6 (six) hours as needed for muscle spasms. 20 tablet 0   No current facility-administered medications for this visit.   Allergies  Allergen Reactions   Penicillins Shortness Of Breath and Palpitations    Has patient had a PCN reaction causing immediate rash, facial/tongue/throat swelling, SOB or lightheadedness with hypotension: Yes Has patient had a PCN reaction causing severe rash involving mucus  membranes or skin necrosis: No Has patient had a PCN reaction that required hospitalization No Has patient had a PCN reaction occurring within the last 10 years: No If all of the above answers are "NO", then may proceed with Cephalosporin use.      Review of Systems: All systems reviewed and negative except where noted in HPI.   Lab Results  Component Value Date   WBC 10.8 (H) 12/08/2020   HGB 14.4 12/08/2020   HCT 46.6 (H) 12/08/2020   MCV 93.6 12/08/2020   PLT 172 12/08/2020    Lab Results  Component Value Date   CREATININE 0.65 12/08/2020   BUN 14 12/08/2020   NA 142 12/08/2020   K 4.1 12/08/2020   CL 108 12/08/2020   CO2 28 12/08/2020     Lab Results  Component Value Date   ALT 17 12/08/2020   AST 22 12/08/2020   ALKPHOS 66 12/08/2020   BILITOT 0.8 12/08/2020      Physical Exam: BP 126/84 (BP Location: Left Arm, Patient Position: Sitting, Cuff Size: Normal)   Pulse 80 Comment: irregular  Ht '4\' 10"'$  (1.473 m) Comment: height measured without shoes  Wt 156 lb 8 oz (71 kg)   BMI 32.71 kg/m  Constitutional: Pleasant,well-developed, female in no acute distress. Abdominal: Soft, nondistended, mild RUQ TTP,  There are no masses palpable.  Extremities: no edema Neurological: Alert and oriented to person place and time. Skin: Skin is warm and dry. No rashes noted. Psychiatric: Normal mood and affect. Behavior is normal.   ASSESSMENT AND PLAN: 74 year old female here for reassessment of the following:  Right upper quadrant pain Gastric lipoma Chronic back pain Gallbladder polyp Family history of colon cancer  As above persistent right upper quadrant pain over the years.  During this work-up she has been found to have a large gastric lipoma however unclear if this is causing any of her symptoms at all.  I have suspected this may be musculoskeletal and she is having a kyphoplasty for mid back pain which she thinks may be related, due to have this on September  1.  She will proceed with back surgery and we will see postoperatively if this helps her abdominal pain.  Hopefully it does.  If not, again question whether or not the lipoma is causing any symptoms.  The problem is that it would be a high risk lesion for endoscopic  removal and could require an operation to remove it.  She is not wanting any operative intervention for it at this time based on her discussion today.  Given it has enlarged slightly over time, we may consider follow-up imaging for this if no intervention is taken.  Her symptoms are not consistent with biliary colic.  She agrees with the plan  Plan: - proceeding with back surgery, 9/1, will see if it helps her abdominal pain - follow up in a few months in the office for reassessment postoperatively - patient declining surgical evaluation for removal of lipoma at this time - may repeat another ultrasound of her gallbladder in 1 year for surveillance of polyp - colonoscopy due October 2023  Jolly Mango, MD [facility]

## 2021-01-29 NOTE — Pre-Procedure Instructions (Signed)
Surgical Instructions    Your procedure is scheduled on Thursday, September 1st, 2022.  Report to [facility] Main Entrance "A" at 10:15 A.M., then check in with the Admitting office.  Call this number if you have problems the morning of surgery:  [phone]   If you have any questions prior to your surgery date call [phone]: Open Monday-Friday 8am-4pm    Remember:  Do not eat after midnight the night before your surgery  You may drink clear liquids until 09:15 A.M. the morning of your surgery.   Clear liquids allowed are: Water, Non-Citrus Juices (without pulp), Carbonated Beverages, Clear Tea, Black Coffee with (NO MILK, CREAM OR POWDERED CREAMER), and Gatorade    Take these medicines the morning of surgery with A SIP OF WATER: KLOR-CON M20  omeprazole (PRILOSEC)    Take these medications the morning of surgery AS NEEDED: acetaminophen (TYLENOL) carboxymethylcellul-glycerin (REFRESH OPTIVE) famotidine (PEPCID) oxyCODONE (OXY IR/ROXICODONE)  tiZANidine (ZANAFLEX)  Take last dose of azaTHIOprine (IMURAN) and hydroxychloroquine (PLAQUENIL) on 02/04/21  As of today, STOP taking any Aspirin (unless otherwise instructed by your surgeon) Aleve, Naproxen, Ibuprofen, Motrin, Advil, Goody's, BC's, all herbal medications, fish oil, and all vitamins.          Do not wear jewelry or makeup Do not wear lotions, powders, perfumes, or deodorant. Do not shave 48 hours prior to surgery.  Do not bring valuables to the hospital. DO Not wear nail polish, gel polish, artificial nails, or any other type of covering on  natural nails including finger and toenails. If patients have artificial nails, gel coating, etc. that need to be removed by a nail salon please have this removed prior to surgery or surgery may need to be canceled/delayed if the surgeon/ anesthesia feels like the patient is unable to be adequately monitored.             [facility] is not responsible for any belongings or  valuables.  Do NOT Smoke (Tobacco/Vaping) or drink Alcohol 24 hours prior to your procedure If you use a CPAP at night, you may bring all equipment for your overnight stay.   Contacts, glasses, dentures or bridgework may not be worn into surgery, please bring cases for these belongings   For patients admitted to the hospital, discharge time will be determined by your treatment team.   Patients discharged the day of surgery will not be allowed to drive home, and someone needs to stay with them for 24 hours.  ONLY 1 SUPPORT PERSON MAY BE PRESENT WHILE YOU ARE IN SURGERY. IF YOU ARE TO BE ADMITTED ONCE YOU ARE IN YOUR ROOM YOU WILL BE ALLOWED TWO (2) VISITORS.  Minor children may have two parents present. Special consideration for safety and communication needs will be reviewed on a case by case basis.  Special instructions:    Oral Hygiene is also important to reduce your risk of infection.  Remember - BRUSH YOUR TEETH THE MORNING OF SURGERY WITH YOUR REGULAR TOOTHPASTE   [place]- Preparing For Surgery  Before surgery, you can play an important role. Because skin is not sterile, your skin needs to be as free of germs as possible. You can reduce the number of germs on your skin by washing with CHG (chlorahexidine gluconate) Soap before surgery.  CHG is an antiseptic cleaner which kills germs and bonds with the skin to continue killing germs even after washing.     Please do not use if you have an allergy to CHG or  antibacterial soaps. If your skin becomes reddened/irritated stop using the CHG.  Do not shave (including legs and underarms) for at least 48 hours prior to first CHG shower. It is OK to shave your face.  Please follow these instructions carefully.     Shower the NIGHT BEFORE SURGERY and the MORNING OF SURGERY with CHG Soap.   If you chose to wash your hair, wash your hair first as usual with your normal shampoo. After you shampoo, rinse your hair and body thoroughly to  remove the shampoo.  Then [employer] and genitals (private parts) with your normal soap and rinse thoroughly to remove soap.  After that Use CHG Soap as you would any other liquid soap. You can apply CHG directly to the skin and wash gently with a scrungie or a clean washcloth.   Apply the CHG Soap to your body ONLY FROM THE NECK DOWN.  Do not use on open wounds or open sores. Avoid contact with your eyes, ears, mouth and genitals (private parts). Wash Face and genitals (private parts)  with your normal soap.   Wash thoroughly, paying special attention to the area where your surgery will be performed.  Thoroughly rinse your body with warm water from the neck down.  DO NOT shower/wash with your normal soap after using and rinsing off the CHG Soap.  Pat yourself dry with a CLEAN TOWEL.  Wear CLEAN PAJAMAS to bed the night before surgery  Place CLEAN SHEETS on your bed the night before your surgery  DO NOT SLEEP WITH PETS.   Day of Surgery:  Take a shower with CHG soap. Wear Clean/Comfortable clothing the morning of surgery Do not apply any deodorants/lotions.   Remember to brush your teeth WITH YOUR REGULAR TOOTHPASTE.   Please read over the following fact sheets that you were given.

## 2021-01-30 ENCOUNTER — Other Ambulatory Visit: Payer: Self-pay

## 2021-01-30 ENCOUNTER — Encounter (HOSPITAL_COMMUNITY): Payer: Self-pay

## 2021-01-30 ENCOUNTER — Encounter (HOSPITAL_COMMUNITY)
Admission: RE | Admit: 2021-01-30 | Discharge: 2021-01-30 | Disposition: A | Discharge status: Home / Self Care | Payer: Medicare Other | Source: Ambulatory Visit | Attending: Orthopedic Surgery | Admitting: Orthopedic Surgery

## 2021-01-30 ENCOUNTER — Ambulatory Visit: Payer: Medicare Other | Admitting: Internal Medicine

## 2021-01-30 ENCOUNTER — Ambulatory Visit: Payer: Self-pay | Admitting: Orthopedic Surgery

## 2021-01-30 DIAGNOSIS — Z01812 Encounter for preprocedural laboratory examination: Secondary | ICD-10-CM | POA: Insufficient documentation

## 2021-01-30 LAB — CBC
HCT: 46.2 % — ABNORMAL HIGH (ref 36.0–46.0)
Hemoglobin: 14.5 g/dL (ref 12.0–15.0)
MCH: 29.5 pg (ref 26.0–34.0)
MCHC: 31.4 g/dL (ref 30.0–36.0)
MCV: 93.9 fL (ref 80.0–100.0)
Platelets: 143 10*3/uL — ABNORMAL LOW (ref 150–400)
RBC: 4.92 MIL/uL (ref 3.87–5.11)
RDW: 14.5 % (ref 11.5–15.5)
WBC: 8.9 10*3/uL (ref 4.0–10.5)
nRBC: 0 % (ref 0.0–0.2)

## 2021-01-30 LAB — BASIC METABOLIC PANEL
Anion gap: 7 (ref 5–15)
BUN: 9 mg/dL (ref 8–23)
CO2: 28 mmol/L (ref 22–32)
Calcium: 9.3 mg/dL (ref 8.9–10.3)
Chloride: 108 mmol/L (ref 98–111)
Creatinine, Ser: 0.97 mg/dL (ref 0.44–1.00)
GFR, Estimated: >60 mL/min (ref >60)
Glucose, Bld: 84 mg/dL (ref 70–99)
Potassium: 3.1 mmol/L — ABNORMAL LOW (ref 3.5–5.1)
Sodium: 143 mmol/L (ref 135–145)

## 2021-01-30 LAB — URINALYSIS, ROUTINE W REFLEX MICROSCOPIC
Bilirubin Urine: NEGATIVE
Glucose, UA: NEGATIVE mg/dL
Hgb urine dipstick: NEGATIVE
Ketones, ur: NEGATIVE mg/dL
Leukocytes,Ua: NEGATIVE
Nitrite: NEGATIVE
Protein, ur: NEGATIVE mg/dL
Specific Gravity, Urine: 1.024 (ref 1.005–1.030)
pH: 5 (ref 5.0–8.0)

## 2021-01-30 LAB — SURGICAL PCR SCREEN
MRSA, PCR: NEGATIVE
Staphylococcus aureus: NEGATIVE

## 2021-01-30 LAB — PROTIME-INR
INR: 1 (ref 0.8–1.2)
Prothrombin Time: 13.2 seconds (ref 11.4–15.2)

## 2021-01-30 LAB — APTT: aPTT: 26 seconds (ref 24–36)

## 2021-01-30 NOTE — H&P (Signed)
Subjective:   75 yo female PMH: Lupus, on azathioprine. Acute/subacute T7, T9, T10 compression fractures causing severe thoracic pain. Scheduled for T7/T9/T10 Kypho with General anasthesia [facility] 02/08/21  Patient Active Problem List   Diagnosis Date Noted   OA (osteoarthritis) of knee 04/24/2020   Primary osteoarthritis of left knee 04/24/2020   Abnormal CT of the abdomen    Abdominal pain, epigastric    SOB (shortness of breath)    Acute respiratory failure with hypoxia ([place]) 10/03/2015   HLD (hyperlipidemia) 10/03/2015   Left ventricular diastolic dysfunction, NYHA class 2 10/03/2015   SLE (systemic lupus erythematosus) ([place]) 10/03/2015   Enlarged thyroid gland 10/03/2015   Recurrent pleural effusion on left    Bilateral pleural effusion (Recurrent) 09/21/2015   GERD (gastroesophageal reflux disease) 09/21/2015   Past Medical History:  Diagnosis Date   Arthritis    Back stiffness 03/13/2017   Pt.'s Dr. thinks it is Lupus related.   Cataract    removed both eyes   Chronic abdominal pain    Diverticulosis    Gallbladder polyp    GERD (gastroesophageal reflux disease)    History of colon polyps    HLD (hyperlipidemia)    Lipoma of stomach    Lupus ([place]) 2014   Pleural effusion 09/2015    Past Surgical History:  Procedure Laterality Date   BREAST LUMPECTOMY Right    CATARACT EXTRACTION, BILATERAL     ESOPHAGOGASTRODUODENOSCOPY N/A 09/09/2017   Procedure: ESOPHAGOGASTRODUODENOSCOPY (EGD);  Surgeon: Yetta Flock, MD;  Location: [facility];  Service: Gastroenterology;  Laterality: N/A;   REPLACEMENT TOTAL KNEE Right    TOTAL KNEE ARTHROPLASTY Left 04/24/2020   Procedure: TOTAL KNEE ARTHROPLASTY;  Surgeon: Gaynelle Arabian, MD;  Location: [facility];  Service: Orthopedics;  Laterality: Left;  3mn   UPPER GASTROINTESTINAL ENDOSCOPY      Current Outpatient Medications  Medication Sig Dispense Refill Last Dose   acetaminophen (TYLENOL) 500 MG tablet Take 1,000 mg  by mouth every 6 (six) hours as needed for headache or moderate pain.      azaTHIOprine (IMURAN) 50 MG tablet Take 100 mg by mouth daily.   2    calcitonin, salmon, (MIACALCIN/FORTICAL) 200 UNIT/ACT nasal spray Place 1 spray into alternate nostrils daily.      carboxymethylcellul-glycerin (REFRESH OPTIVE) 0.5-0.9 % ophthalmic solution Place 1 drop into both eyes 2 (two) times daily as needed for dry eyes.      famotidine (PEPCID) 20 MG tablet TAKE 1-2 TABLETS (20-40 MG TOTAL) BY MOUTH DAILY. (Patient taking differently: Take 20 mg by mouth daily as needed for indigestion or heartburn.) 180 tablet 1    furosemide (LASIX) 80 MG tablet Take 1 tablet (80 mg total) by mouth daily. Please have PCP manage going forward. 30 tablet 0    hydroxychloroquine (PLAQUENIL) 200 MG tablet Take 1 tablet (200 mg total) by mouth daily. (Patient taking differently: Take 400 mg by mouth daily.) 30 tablet 0    KLOR-CON M20 20 MEQ tablet TAKE 1 TABLET (20 MEQ TOTAL) BY MOUTH DAILY. PLEASE HAVE PCP MANAGE GOING FORWARD. 30 tablet 0    omeprazole (PRILOSEC) 40 MG capsule Take 1 capsule (40 mg total) by mouth daily. Best to take on an empty stomach 20-30 minutes before breakfast. 30 capsule 3    oxyCODONE (OXY IR/ROXICODONE) 5 MG immediate release tablet Take 5 mg by mouth every 6 (six) hours as needed for moderate pain.      predniSONE (DELTASONE) 5 MG tablet Take 5 mg  by mouth daily with breakfast.      tiZANidine (ZANAFLEX) 4 MG tablet Take 1 tablet (4 mg total) by mouth every 6 (six) hours as needed for muscle spasms. 20 tablet 0    No current facility-administered medications for this visit.   Allergies  Allergen Reactions   Penicillins Shortness Of Breath and Palpitations    Has patient had a PCN reaction causing immediate rash, facial/tongue/throat swelling, SOB or lightheadedness with hypotension: Yes Has patient had a PCN reaction causing severe rash involving mucus membranes or skin necrosis: No Has patient had a  PCN reaction that required hospitalization No Has patient had a PCN reaction occurring within the last 10 years: No If all of the above answers are "NO", then may proceed with Cephalosporin use.     Social History   Tobacco Use   Smoking status: Never   Smokeless tobacco: Never  Substance Use Topics   Alcohol use: No    Family History  Problem Relation Age of Onset   Asthma Mother    Colon cancer Sister    Colon polyps Brother    Other Brother        colon surgery, growth removed   Colon cancer Other        niece, deceased 23-Sep-2016   Esophageal cancer Neg Hx    Rectal cancer Neg Hx    Stomach cancer Neg Hx     Review of Systems Pertinent items are noted in HPI.  Objective:   Visits: Ht: 5 ft 01/30/2021 02:30 pm Wt: 161 lbs 01/30/2021 02:30 pm BMI: 31.4 01/30/2021 02:30 pm BP: 118/60 01/30/2021 02:32 pm Pulse: 89 bpm 01/30/2021 02:30 pm  Jane Chavez continues to have debilitating mid thoracic pain with palpation. No focal neurological deficits. She is alert and oriented 3, no apparent distress  Heart: Regular rate and rhythm, no rubs, murmurs, or gallops  Lungs: Clear auscultation bilaterally  Abdomen: Bowel sounds 4, nondistended, nontender, no rebound tenderness, no loss of bladder or bowel control.  Thoracic MRI: completed on 12/30/20 was reviewed with the patient. It was completed at [facility]; I have independently reviewed the images as well as the radiology report. Chronic compression fractures T4-6. Patient has acute/subacute compression fracture of T7, T9, and T10. No retropulsed bone fragments no significant stenosis. Mild degenerative disease but no significant stenosis.  Assessment:   Jane Chavez is a pleasant 75 year old female with acute/subacute T7, T9, T10 compression deformities. These are causing severe, debilitating thoracic back pain. We have discussed risks and benefits of surgical intervention in addition to alternative treatments and the patient has  elected to move forward with a 3 level kyphoplasty.   Plan:   T7, T9, T10 kyphoplasty with general anesthesia  Risks of surgery include: Infection, bleeding, death, stroke, paralysis, nerve damage, leak of cement, need for additional surgery including open decompression. Ongoing or worse pain. Goals of surgery: Reduction in pain, and improvement in quality of life  We have obtained preoperative medical clearance from the patient's primary care provider.  I did review the patient's medication list with her. She is not on any blood thinners. Not using any aspirin. Not taking any anti-inflammatories.  We have also discussed the post-operative recovery period to include: bathing/showering restrictions, wound healing, activity (and driving) restrictions, medications/pain mangement. Of note, the patient does not tolerate tramadol well as she states it causes insomnia she tolerates oxycodone well we will plan to use Percocet 5/325 for her postop pain.  We have also discussed post-operative redflags to  include: signs and symptoms of postoperative infection, DVT/PE.  Patient given a copy of her discharge instructions which were reviewed with her today  Follow-up: 2 weeks postop

## 2021-01-30 NOTE — H&P (Deleted)
  The note originally documented on this encounter has been moved the the encounter in which it belongs.  

## 2021-01-30 NOTE — Progress Notes (Signed)
PCP - Carlynn Purl, ENP [facility] [place] Cardiologist - Denies  Chest x-ray - 12/08/20 EKG - 12/08/20 Stress Test - Denies ECHO - 10/30/20 Cardiac Cath - Denies  ERAS Protcol - Yes  COVID TEST- Requested her to go on Monday or Tuesday 29th or the 30th   Anesthesia review: No  Patient denies shortness of breath, fever, cough and chest pain at PAT appointment   All instructions explained to the patient, with a verbal understanding of the material. Patient agrees to go over the instructions while at home for a better understanding. Patient also instructed to wear a mask while in public after being tested for COVID-19. The opportunity to ask questions was provided.

## 2021-02-05 ENCOUNTER — Other Ambulatory Visit: Payer: Self-pay | Admitting: Orthopedic Surgery

## 2021-02-06 LAB — SARS CORONAVIRUS 2 (TAT 6-24 HRS): SARS Coronavirus 2: NEGATIVE

## 2021-02-08 ENCOUNTER — Ambulatory Visit (HOSPITAL_COMMUNITY): Payer: Medicare Other | Admitting: Physician Assistant

## 2021-02-08 ENCOUNTER — Other Ambulatory Visit: Payer: Self-pay

## 2021-02-08 ENCOUNTER — Ambulatory Visit (HOSPITAL_COMMUNITY): Payer: Medicare Other

## 2021-02-08 ENCOUNTER — Ambulatory Visit (HOSPITAL_COMMUNITY)
Admission: RE | Admit: 2021-02-08 | Discharge: 2021-02-08 | Disposition: A | Discharge status: Home / Self Care | Payer: Medicare Other | Attending: Orthopedic Surgery | Admitting: Orthopedic Surgery

## 2021-02-08 ENCOUNTER — Encounter (HOSPITAL_COMMUNITY): Payer: Self-pay | Admitting: Orthopedic Surgery

## 2021-02-08 ENCOUNTER — Encounter (HOSPITAL_COMMUNITY)
Admission: RE | Disposition: A | Discharge status: Home / Self Care | Payer: Self-pay | Source: Home / Self Care | Attending: Orthopedic Surgery

## 2021-02-08 ENCOUNTER — Ambulatory Visit (HOSPITAL_COMMUNITY): Payer: Medicare Other | Admitting: Anesthesiology

## 2021-02-08 DIAGNOSIS — Z79899 Other long term (current) drug therapy: Secondary | ICD-10-CM | POA: Insufficient documentation

## 2021-02-08 DIAGNOSIS — Z96652 Presence of left artificial knee joint: Secondary | ICD-10-CM | POA: Diagnosis not present

## 2021-02-08 DIAGNOSIS — M8088XA Other osteoporosis with current pathological fracture, vertebra(e), initial encounter for fracture: Secondary | ICD-10-CM | POA: Insufficient documentation

## 2021-02-08 DIAGNOSIS — J9601 Acute respiratory failure with hypoxia: Secondary | ICD-10-CM | POA: Diagnosis not present

## 2021-02-08 DIAGNOSIS — Z419 Encounter for procedure for purposes other than remedying health state, unspecified: Secondary | ICD-10-CM

## 2021-02-08 DIAGNOSIS — E785 Hyperlipidemia, unspecified: Secondary | ICD-10-CM | POA: Diagnosis not present

## 2021-02-08 DIAGNOSIS — K219 Gastro-esophageal reflux disease without esophagitis: Secondary | ICD-10-CM | POA: Diagnosis not present

## 2021-02-08 DIAGNOSIS — Z880 Allergy status to penicillin: Secondary | ICD-10-CM | POA: Insufficient documentation

## 2021-02-08 DIAGNOSIS — M4854XA Collapsed vertebra, not elsewhere classified, thoracic region, initial encounter for fracture: Secondary | ICD-10-CM | POA: Diagnosis not present

## 2021-02-08 DIAGNOSIS — M8008XA Age-related osteoporosis with current pathological fracture, vertebra(e), initial encounter for fracture: Secondary | ICD-10-CM | POA: Diagnosis not present

## 2021-02-08 HISTORY — PX: KYPHOPLASTY: SHX5884

## 2021-02-08 SURGERY — KYPHOPLASTY
Anesthesia: General | Site: Spine Thoracic

## 2021-02-08 MED ORDER — HYDROMORPHONE HCL 1 MG/ML IJ SOLN
INTRAMUSCULAR | Status: AC
  Filled 2021-02-08: qty 1

## 2021-02-08 MED ORDER — CHLORHEXIDINE GLUCONATE 0.12 % MT SOLN: 15.0000 mL | Freq: Once | OROMUCOSAL | Status: AC

## 2021-02-08 MED ORDER — AMISULPRIDE (ANTIEMETIC) 5 MG/2ML IV SOLN
10.0000 mg | Freq: Once | INTRAVENOUS | Status: AC
  Administered 2021-02-08: 10 mg via INTRAVENOUS

## 2021-02-08 MED ORDER — LACTATED RINGERS IV SOLN: INTRAVENOUS | Status: DC

## 2021-02-08 MED ORDER — ORAL CARE MOUTH RINSE: 15.0000 mL | Freq: Once | OROMUCOSAL | Status: AC

## 2021-02-08 MED ORDER — DEXAMETHASONE SODIUM PHOSPHATE 10 MG/ML IJ SOLN
INTRAMUSCULAR | Status: AC
  Filled 2021-02-08: qty 1

## 2021-02-08 MED ORDER — CHLORHEXIDINE GLUCONATE 0.12 % MT SOLN
OROMUCOSAL | Status: AC
  Administered 2021-02-08: 15 mL via OROMUCOSAL
  Filled 2021-02-08: qty 15

## 2021-02-08 MED ORDER — HYDROCODONE-ACETAMINOPHEN 5-325 MG PO TABS: 1.0000 | ORAL_TABLET | Freq: Four times a day (QID) | ORAL | 0 refills | Status: AC | PRN

## 2021-02-08 MED ORDER — 0.9 % SODIUM CHLORIDE (POUR BTL) OPTIME
TOPICAL | Status: DC | PRN
  Administered 2021-02-08: 1000 mL

## 2021-02-08 MED ORDER — FENTANYL CITRATE (PF) 250 MCG/5ML IJ SOLN
INTRAMUSCULAR | Status: AC
  Filled 2021-02-08: qty 5

## 2021-02-08 MED ORDER — ONDANSETRON HCL 4 MG/2ML IJ SOLN
INTRAMUSCULAR | Status: AC
  Filled 2021-02-08: qty 2

## 2021-02-08 MED ORDER — OXYCODONE HCL 5 MG/5ML PO SOLN
5.0000 mg | Freq: Once | ORAL | Status: AC | PRN
Start: 2021-02-08 — End: 2021-02-08

## 2021-02-08 MED ORDER — PHENYLEPHRINE 40 MCG/ML (10ML) SYRINGE FOR IV PUSH (FOR BLOOD PRESSURE SUPPORT)
PREFILLED_SYRINGE | INTRAVENOUS | Status: AC
  Filled 2021-02-08: qty 10

## 2021-02-08 MED ORDER — PROPOFOL 10 MG/ML IV BOLUS
INTRAVENOUS | Status: DC | PRN
  Administered 2021-02-08: 120 mg via INTRAVENOUS
  Administered 2021-02-08 (×2): 30 mg via INTRAVENOUS

## 2021-02-08 MED ORDER — ONDANSETRON HCL 4 MG/2ML IJ SOLN
INTRAMUSCULAR | Status: DC | PRN
  Administered 2021-02-08: 4 mg via INTRAVENOUS

## 2021-02-08 MED ORDER — AMISULPRIDE (ANTIEMETIC) 5 MG/2ML IV SOLN
INTRAVENOUS | Status: AC
  Filled 2021-02-08: qty 4

## 2021-02-08 MED ORDER — VANCOMYCIN HCL IN DEXTROSE 1-5 GM/200ML-% IV SOLN
1000.0000 mg | INTRAVENOUS | Status: AC
  Administered 2021-02-08: 1000 mg via INTRAVENOUS
  Filled 2021-02-08: qty 200

## 2021-02-08 MED ORDER — HYDROMORPHONE HCL 1 MG/ML IJ SOLN
0.2500 mg | INTRAMUSCULAR | Status: DC | PRN
  Administered 2021-02-08 (×3): 0.5 mg via INTRAVENOUS

## 2021-02-08 MED ORDER — ONDANSETRON HCL 4 MG PO TABS: 4.0000 mg | ORAL_TABLET | Freq: Three times a day (TID) | ORAL | 0 refills | Status: AC | PRN

## 2021-02-08 MED ORDER — PROPOFOL 10 MG/ML IV BOLUS
INTRAVENOUS | Status: AC
  Filled 2021-02-08: qty 20

## 2021-02-08 MED ORDER — ONDANSETRON HCL 4 MG/2ML IJ SOLN
4.0000 mg | Freq: Once | INTRAMUSCULAR | Status: AC | PRN
  Administered 2021-02-08: 4 mg via INTRAVENOUS

## 2021-02-08 MED ORDER — EPHEDRINE 5 MG/ML INJ
INTRAVENOUS | Status: AC
  Filled 2021-02-08: qty 5

## 2021-02-08 MED ORDER — ROCURONIUM BROMIDE 10 MG/ML (PF) SYRINGE
PREFILLED_SYRINGE | INTRAVENOUS | Status: DC | PRN
  Administered 2021-02-08: 60 mg via INTRAVENOUS

## 2021-02-08 MED ORDER — OXYCODONE HCL 5 MG PO TABS
5.0000 mg | ORAL_TABLET | Freq: Once | ORAL | Status: AC | PRN
  Administered 2021-02-08: 5 mg via ORAL

## 2021-02-08 MED ORDER — GLYCOPYRROLATE PF 0.2 MG/ML IJ SOSY
PREFILLED_SYRINGE | INTRAMUSCULAR | Status: AC
  Filled 2021-02-08: qty 1

## 2021-02-08 MED ORDER — ROCURONIUM BROMIDE 10 MG/ML (PF) SYRINGE
PREFILLED_SYRINGE | INTRAVENOUS | Status: AC
  Filled 2021-02-08: qty 10

## 2021-02-08 MED ORDER — BUPIVACAINE-EPINEPHRINE 0.25% -1:200000 IJ SOLN
INTRAMUSCULAR | Status: DC | PRN
  Administered 2021-02-08: 30 mL

## 2021-02-08 MED ORDER — BUPIVACAINE-EPINEPHRINE (PF) 0.25% -1:200000 IJ SOLN
INTRAMUSCULAR | Status: AC
  Filled 2021-02-08: qty 30

## 2021-02-08 MED ORDER — IOPAMIDOL (ISOVUE-300) INJECTION 61%
INTRAVENOUS | Status: DC | PRN
  Administered 2021-02-08: 30 mL

## 2021-02-08 MED ORDER — SUGAMMADEX SODIUM 500 MG/5ML IV SOLN
INTRAVENOUS | Status: DC | PRN
  Administered 2021-02-08: 400 mg via INTRAVENOUS

## 2021-02-08 MED ORDER — OXYCODONE HCL 5 MG PO TABS
ORAL_TABLET | ORAL | Status: AC
  Filled 2021-02-08: qty 1

## 2021-02-08 MED ORDER — DEXMEDETOMIDINE (PRECEDEX) IN NS 20 MCG/5ML (4 MCG/ML) IV SYRINGE
PREFILLED_SYRINGE | INTRAVENOUS | Status: AC
  Filled 2021-02-08: qty 5

## 2021-02-08 MED ORDER — LIDOCAINE 2% (20 MG/ML) 5 ML SYRINGE
INTRAMUSCULAR | Status: AC
  Filled 2021-02-08: qty 5

## 2021-02-08 MED ORDER — PHENYLEPHRINE 40 MCG/ML (10ML) SYRINGE FOR IV PUSH (FOR BLOOD PRESSURE SUPPORT)
PREFILLED_SYRINGE | INTRAVENOUS | Status: DC | PRN
  Administered 2021-02-08: 80 ug via INTRAVENOUS
  Administered 2021-02-08 (×2): 160 ug via INTRAVENOUS

## 2021-02-08 MED ORDER — DEXAMETHASONE SODIUM PHOSPHATE 10 MG/ML IJ SOLN
INTRAMUSCULAR | Status: DC | PRN
  Administered 2021-02-08: 4 mg via INTRAVENOUS

## 2021-02-08 MED ORDER — FENTANYL CITRATE (PF) 100 MCG/2ML IJ SOLN
INTRAMUSCULAR | Status: DC | PRN
  Administered 2021-02-08: 100 ug via INTRAVENOUS
  Administered 2021-02-08: 50 ug via INTRAVENOUS
  Administered 2021-02-08: 100 ug via INTRAVENOUS

## 2021-02-08 MED ORDER — EPHEDRINE SULFATE-NACL 50-0.9 MG/10ML-% IV SOSY
PREFILLED_SYRINGE | INTRAVENOUS | Status: DC | PRN
  Administered 2021-02-08: 15 mg via INTRAVENOUS

## 2021-02-08 MED ORDER — LACTATED RINGERS IV SOLN: INTRAVENOUS | Status: DC | PRN

## 2021-02-08 MED ORDER — LIDOCAINE 2% (20 MG/ML) 5 ML SYRINGE
INTRAMUSCULAR | Status: DC | PRN
  Administered 2021-02-08: 60 mg via INTRAVENOUS

## 2021-02-08 SURGICAL SUPPLY — 43 items
BAG COUNTER SPONGE SURGICOUNT (BAG) IMPLANT
BLADE SURG 15 STRL LF DISP TIS (BLADE) ×1 IMPLANT
BLADE SURG 15 STRL SS (BLADE) ×1
BNDG ADH 1X3 SHEER STRL LF (GAUZE/BANDAGES/DRESSINGS) ×2 IMPLANT
CEMENT KYPHON C01A KIT/MIXER (Cement) ×2 IMPLANT
CEMENT KYPHON CX01A KIT/MIXER (Cement) ×2 IMPLANT
COVER MAYO STAND STRL (DRAPES) ×2 IMPLANT
COVER SURGICAL LIGHT HANDLE (MISCELLANEOUS) ×2 IMPLANT
CURETTE EXPRESS SZ2 7MM (INSTRUMENTS) IMPLANT
CURETTE WEDGE 8.5MM KYPHX (MISCELLANEOUS) IMPLANT
CURRETTE EXPRESS SZ2 7MM (INSTRUMENTS)
DERMABOND ADVANCED (GAUZE/BANDAGES/DRESSINGS) ×1
DERMABOND ADVANCED .7 DNX12 (GAUZE/BANDAGES/DRESSINGS) ×1 IMPLANT
DRAIN CHANNEL 15F RND FF W/TCR (WOUND CARE) IMPLANT
DRAPE C-ARM 42X72 X-RAY (DRAPES) ×4 IMPLANT
DRAPE INCISE IOBAN 66X45 STRL (DRAPES) ×2 IMPLANT
DRAPE LAPAROTOMY T 102X78X121 (DRAPES) ×2 IMPLANT
DRAPE SHEET LG 3/4 BI-LAMINATE (DRAPES) ×2 IMPLANT
DRAPE WARM FLUID 44X44 (DRAPES) ×2 IMPLANT
DURAPREP 26ML APPLICATOR (WOUND CARE) ×2 IMPLANT
GLOVE SURG ENC MOIS LTX SZ6.5 (GLOVE) ×2 IMPLANT
GLOVE SURG MICRO LTX SZ8.5 (GLOVE) ×2 IMPLANT
GLOVE SURG UNDER POLY LF SZ6.5 (GLOVE) ×2 IMPLANT
GLOVE SURG UNDER POLY LF SZ8.5 (GLOVE) IMPLANT
GOWN STRL REUS W/ TWL LRG LVL3 (GOWN DISPOSABLE) ×2 IMPLANT
GOWN STRL REUS W/TWL 2XL LVL3 (GOWN DISPOSABLE) ×2 IMPLANT
GOWN STRL REUS W/TWL LRG LVL3 (GOWN DISPOSABLE) ×2
KIT BASIN OR (CUSTOM PROCEDURE TRAY) ×2 IMPLANT
KIT OSTEOCOOL BONE ACCESS 8G (MISCELLANEOUS) ×2 IMPLANT
KIT TURNOVER KIT B (KITS) ×2 IMPLANT
NEEDLE HYPO 22GX1.5 SAFETY (NEEDLE) ×2 IMPLANT
NEEDLE SPNL 22GX3.5 QUINCKE BK (NEEDLE) ×2 IMPLANT
NS IRRIG 1000ML POUR BTL (IV SOLUTION) ×2 IMPLANT
PACK SURGICAL SETUP 50X90 (CUSTOM PROCEDURE TRAY) ×2 IMPLANT
PAD ARMBOARD 7.5X6 YLW CONV (MISCELLANEOUS) ×4 IMPLANT
SPONGE T-LAP 4X18 ~~LOC~~+RFID (SPONGE) IMPLANT
SUT MNCRL AB 3-0 PS2 18 (SUTURE) ×2 IMPLANT
SYR CONTROL 10ML LL (SYRINGE) ×2 IMPLANT
TAMP BONE INFLATABLE 10/3 (BALLOONS) ×6 IMPLANT
TOWEL GREEN STERILE (TOWEL DISPOSABLE) ×2 IMPLANT
TRAY KYPHOPAK 15/3 ONESTEP 1ST (MISCELLANEOUS) ×2 IMPLANT
TRAY KYPHOPAK 20/3 ONESTEP 1ST (MISCELLANEOUS) ×2 IMPLANT
WATER STERILE IRR 1000ML POUR (IV SOLUTION) IMPLANT

## 2021-02-08 NOTE — Transfer of Care (Signed)
Immediate Anesthesia Transfer of Care Note  Patient: Jane Chavez  Procedure(s) Performed: KYPHOPLASTY THORACIC 7, THORACIC 9, THORACIC 10 (Spine Thoracic)  Patient Location: PACU  Anesthesia Type:General  Level of Consciousness: drowsy, patient cooperative and responds to stimulation  Airway & Oxygen Therapy: Patient Spontanous Breathing and Patient connected to nasal cannula oxygen  Post-op Assessment: Report given to RN, Post -op Vital signs reviewed and stable and Patient moving all extremities X 4  Post vital signs: Reviewed and stable  Last Vitals:  Vitals Value Taken Time  BP    Temp    Pulse 86 02/08/21 1244  Resp 26 02/08/21 1244  SpO2 97 % 02/08/21 1244  Vitals shown include unvalidated device data.  Last Pain:  Vitals:   02/08/21 0947  TempSrc:   PainSc: 0-No pain         Complications: No notable events documented.

## 2021-02-08 NOTE — H&P (Signed)
Addendum H&P: Patient continues to have significant thoracic pain due to her 3 osteoporotic compression fractures.  There is been no significant change in her clinical exam since her last office visit of 01/30/2021.  Plan on moving forward with a 3 level kyphoplasty.  I reviewed the risks, benefits, and alternatives with the patient and she is expressed understanding as well as willingness to move forward with surgery.

## 2021-02-08 NOTE — Anesthesia Postprocedure Evaluation (Addendum)
Anesthesia Post Note  Patient: Jane Chavez  Procedure(s) Performed: KYPHOPLASTY THORACIC 7, THORACIC 9, THORACIC 10 (Spine Thoracic)     Patient location during evaluation: PACU Anesthesia Type: General Level of consciousness: awake and alert and oriented Pain management: pain level controlled Vital Signs Assessment: post-procedure vital signs reviewed and stable Respiratory status: spontaneous breathing, nonlabored ventilation and respiratory function stable Cardiovascular status: blood pressure returned to baseline and stable Postop Assessment: no apparent nausea or vomiting Anesthetic complications: no Comments: Patient complained of chest pain in PACU, 12 lead EKG obtained which was unchanged from prior EKG from July.   No notable events documented.  Last Vitals:  Vitals:   02/08/21 1345 02/08/21 1400  BP: 131/82 130/79  Pulse: 85 80  Resp: (!) 21 14  Temp:    SpO2: 99% 96%    Last Pain:  Vitals:   02/08/21 1400  TempSrc:   PainSc: 5                  Novalyn Lajara A.

## 2021-02-08 NOTE — Progress Notes (Signed)
Pt. Complained of chest tightness, Dr. Royce Macadamia made aware.  ECG ordered and patient was in NSR. Dr. Royce Macadamia made aware and listened to patient lungs and said they sounded clear.  Ordered 0.5 of diluadid for pain. Patient felt better and walked around unit and was ready for discharge.

## 2021-02-08 NOTE — Anesthesia Procedure Notes (Signed)
Procedure Name: Intubation Date/Time: 02/08/2021 10:47 AM Performed by: Cathren Harsh, CRNA Pre-anesthesia Checklist: Patient identified, Emergency Drugs available, Suction available and Patient being monitored Patient Re-evaluated:Patient Re-evaluated prior to induction Oxygen Delivery Method: Circle System Utilized Preoxygenation: Pre-oxygenation with 100% oxygen Induction Type: IV induction Ventilation: Mask ventilation without difficulty Laryngoscope Size: Mac and 3 Grade View: Grade I Tube type: Oral Number of attempts: 1 Airway Equipment and Method: Stylet and Oral airway Placement Confirmation: ETT inserted through vocal cords under direct vision, positive ETCO2 and breath sounds checked- equal and bilateral Secured at: 21 cm Tube secured with: Tape Dental Injury: Teeth and Oropharynx as per pre-operative assessment

## 2021-02-08 NOTE — Brief Op Note (Signed)
02/08/2021  12:40 PM  PATIENT:  [name]  75 y.o. female  PRE-OPERATIVE DIAGNOSIS:  T7, T9 and T10 compression fractures  POST-OPERATIVE DIAGNOSIS:  T7, T9 and T10 compression fractures  PROCEDURE:  Procedure(s): KYPHOPLASTY THORACIC 7, THORACIC 9, THORACIC 10 (N/A)  SURGEON:  Surgeon(s) and Role:    Melina Schools, MD - Primary  PHYSICIAN ASSISTANT:   ASSISTANTS: none   ANESTHESIA:   general  EBL:  15 mL   BLOOD ADMINISTERED:none  DRAINS: none   LOCAL MEDICATIONS USED:  MARCAINE     SPECIMEN:  No Specimen  DISPOSITION OF SPECIMEN:  N/A  COUNTS:  YES  TOURNIQUET:  * No tourniquets in log *  DICTATION: .Dragon Dictation  PLAN OF CARE: Discharge to home after PACU  PATIENT DISPOSITION:  PACU - hemodynamically stable.

## 2021-02-08 NOTE — Anesthesia Preprocedure Evaluation (Addendum)
Anesthesia Evaluation  Patient identified by MRN, date of birth, ID band Patient awake    Reviewed: Allergy & Precautions, NPO status , Patient's Chart, lab work & pertinent test results  Airway Mallampati: II  TM Distance: >3 FB Neck ROM: Full    Dental  (+) Teeth Intact, Dental Advisory Given, Missing, Caps,    Pulmonary neg pulmonary ROS,    Pulmonary exam normal breath sounds clear to auscultation       Cardiovascular negative cardio ROS Normal cardiovascular exam Rhythm:Regular Rate:Normal     Neuro/Psych negative neurological ROS  negative psych ROS   GI/Hepatic Neg liver ROS, GERD  Medicated,  Endo/Other  Obesity Hyperlipidemia  Renal/GU negative Renal ROS  negative genitourinary   Musculoskeletal  (+) Arthritis , T7, T9, T10 compression Fx   Abdominal (+) + obese,   Peds  Hematology Lupus   Anesthesia Other Findings   Reproductive/Obstetrics                            Anesthesia Physical Anesthesia Plan  ASA: 3  Anesthesia Plan: General   Post-op Pain Management:    Induction: Intravenous  PONV Risk Score and Plan: 4 or greater and Treatment may vary due to age or medical condition, Ondansetron and Dexamethasone  Airway Management Planned: Oral ETT  Additional Equipment:   Intra-op Plan:   Post-operative Plan: Extubation in OR  Informed Consent: I have reviewed the patients History and Physical, chart, labs and discussed the procedure including the risks, benefits and alternatives for the proposed anesthesia with the patient or authorized representative who has indicated his/her understanding and acceptance.     Dental advisory given  Plan Discussed with: CRNA and Anesthesiologist  Anesthesia Plan Comments:         Anesthesia Quick Evaluation

## 2021-02-08 NOTE — Op Note (Signed)
OPERATIVE REPORT  DATE OF SURGERY: 02/08/2021  PATIENT NAME:  Jane Chavez MRN: [id number] DOB: 20-Feb-1946  PCP: Pcp, No  PRE-OPERATIVE DIAGNOSIS: Thoracic osteoporotic compression fractures.  T7, T9, and T10  POST-OPERATIVE DIAGNOSIS: Same  PROCEDURE:   Kyphoplasty at T7, 9, and 10.  SURGEON:  Melina Schools, MD  PHYSICIAN ASSISTANT: None  ANESTHESIA:   General  EBL: Minimal   Complications: None  BRIEF HISTORY: Jane Chavez is a 75 y.o. female who had significant thoracic pain.  An MRI demonstrated chronic compression fractures of T4-6.  Patient had positive edema and acute/subacute compression fractures of at T7, T9, and T10.  Because of the ongoing severe pain and loss in quality of life we elected to move forward with a kyphoplasty.  All appropriate risks, benefits, and alternatives were discussed with the patient and consent was obtained.  PROCEDURE DETAILS: Patient was brought into the operating room and was properly positioned on the operating room table, and teds and SCDs were applied.  After induction with general anesthesia the patient was endotracheally intubated.  A timeout was taken to confirm all important data: Including patient, procedure, and the level.  The back was prepped and draped in a standard fashion.  Using lateral fluoroscopy starting at the lumbar spine we counted up to identify the T10 vertebral body.  I confirmed this using the AP plane and identifying the most caudal rib.  I then confirmed with the MRI counting from C2 down to T 10.  Using the lateral scalp continue the patient was noted to have 4 lumbar vertebrae.  Using this numbering system I counted again and identify the T9 and T10 levels.  Once these levels are identified I could then easily identify the T7 level.  I then compared the anatomy seen on the image today out of the MRI and preoperative x-ray and confirmed that we are at the appropriate levels.  Using biplane fluoroscopy identified the  lateral border of the pedicle of T10.  Small stab incision was made and a Jamshidi needle was advanced to the lateral aspect of the pedicle.  I then advanced the Jamshidi needle taking an extrapedicular approach to the spine.  I advanced it down and then entered the lateral aspect of the pedicle and then into the vertebral body.  I confirmed trajectory and position in both planes.  I repeated this exact same procedure on the contralateral side.  Once the T10 pedicles were cannulated I repeated the procedure to cannulate the T 9, and T7 pedicles.  Each time an extrapedicular approach was taken once all 6 pedicles were cannulated I then mixed the cement.  Inflatable bone tamps were then inserted and I inflated to create a void at each level.  I then deflated the balloon and inserted the cement.  Approximately 3 cc was placed into each level.  There was no cement leak on imaging studies, and had satisfactory placement.  The cement was allowed to harden the trochars were removed.  Skin was cleaned and each of the small stab incisions were closed with a 3 oh simple Monocryl stitch.  Dermabond and a ended was applied and the patient was ultimately extubated and transferred to PACU without incident.  End of the case all needle and sponge counts were correct there were no adverse intraoperative events.  Melina Schools, MD 02/08/2021 12:34 PM

## 2021-02-13 ENCOUNTER — Encounter (HOSPITAL_COMMUNITY): Payer: Self-pay | Admitting: Orthopedic Surgery

## 2021-02-19 DIAGNOSIS — Z79899 Other long term (current) drug therapy: Secondary | ICD-10-CM | POA: Diagnosis not present

## 2021-02-19 DIAGNOSIS — E559 Vitamin D deficiency, unspecified: Secondary | ICD-10-CM | POA: Diagnosis not present

## 2021-02-19 DIAGNOSIS — M8580 Other specified disorders of bone density and structure, unspecified site: Secondary | ICD-10-CM | POA: Diagnosis not present

## 2021-02-19 DIAGNOSIS — Z23 Encounter for immunization: Secondary | ICD-10-CM | POA: Diagnosis not present

## 2021-02-19 DIAGNOSIS — M549 Dorsalgia, unspecified: Secondary | ICD-10-CM | POA: Diagnosis not present

## 2021-02-19 DIAGNOSIS — M329 Systemic lupus erythematosus, unspecified: Secondary | ICD-10-CM | POA: Diagnosis not present

## 2021-02-19 DIAGNOSIS — M1990 Unspecified osteoarthritis, unspecified site: Secondary | ICD-10-CM | POA: Diagnosis not present

## 2021-03-08 DIAGNOSIS — Z96652 Presence of left artificial knee joint: Secondary | ICD-10-CM | POA: Diagnosis not present

## 2021-03-09 DIAGNOSIS — M5459 Other low back pain: Secondary | ICD-10-CM | POA: Diagnosis not present

## 2021-03-09 DIAGNOSIS — M546 Pain in thoracic spine: Secondary | ICD-10-CM | POA: Diagnosis not present

## 2021-03-17 ENCOUNTER — Other Ambulatory Visit: Payer: Self-pay | Admitting: Gastroenterology

## 2021-03-17 DIAGNOSIS — R1010 Upper abdominal pain, unspecified: Secondary | ICD-10-CM

## 2021-03-17 DIAGNOSIS — K209 Esophagitis, unspecified without bleeding: Secondary | ICD-10-CM

## 2021-03-20 DIAGNOSIS — Z0000 Encounter for general adult medical examination without abnormal findings: Secondary | ICD-10-CM | POA: Diagnosis not present

## 2021-04-05 DIAGNOSIS — Z20828 Contact with and (suspected) exposure to other viral communicable diseases: Secondary | ICD-10-CM | POA: Diagnosis not present

## 2021-04-10 ENCOUNTER — Ambulatory Visit (INDEPENDENT_AMBULATORY_CARE_PROVIDER_SITE_OTHER): Payer: Medicare Other | Admitting: Gastroenterology

## 2021-04-10 ENCOUNTER — Encounter: Payer: Self-pay | Admitting: Gastroenterology

## 2021-04-10 VITALS — BP 131/78 | HR 73 | Ht 59.0 in | Wt 157.5 lb

## 2021-04-10 DIAGNOSIS — Z800 Family history of malignant neoplasm of digestive organs: Secondary | ICD-10-CM | POA: Diagnosis not present

## 2021-04-10 DIAGNOSIS — D175 Benign lipomatous neoplasm of intra-abdominal organs: Secondary | ICD-10-CM

## 2021-04-10 DIAGNOSIS — K209 Esophagitis, unspecified without bleeding: Secondary | ICD-10-CM | POA: Diagnosis not present

## 2021-04-10 DIAGNOSIS — M549 Dorsalgia, unspecified: Secondary | ICD-10-CM

## 2021-04-10 DIAGNOSIS — K824 Cholesterolosis of gallbladder: Secondary | ICD-10-CM | POA: Diagnosis not present

## 2021-04-10 DIAGNOSIS — K219 Gastro-esophageal reflux disease without esophagitis: Secondary | ICD-10-CM

## 2021-04-10 DIAGNOSIS — G8929 Other chronic pain: Secondary | ICD-10-CM

## 2021-04-10 DIAGNOSIS — R1011 Right upper quadrant pain: Secondary | ICD-10-CM | POA: Diagnosis not present

## 2021-04-10 DIAGNOSIS — R1010 Upper abdominal pain, unspecified: Secondary | ICD-10-CM

## 2021-04-10 MED ORDER — OMEPRAZOLE 40 MG PO CPDR: 40.0000 mg | DELAYED_RELEASE_CAPSULE | ORAL | 0 refills | Status: DC

## 2021-04-10 MED ORDER — FAMOTIDINE 20 MG PO TABS: 20.0000 mg | ORAL_TABLET | Freq: Every day | ORAL | Status: AC | PRN

## 2021-04-10 NOTE — Patient Instructions (Signed)
If you are age 73 or older, your body mass index should be between 23-30. Your Body mass index is 31.81 kg/m. If this is out of the aforementioned range listed, please consider follow up with your Primary Care Provider.  If you are age 55 or younger, your body mass index should be between 19-25. Your Body mass index is 31.81 kg/m. If this is out of the aformentioned range listed, please consider follow up with your Primary Care Provider.   ________________________________________________________  The [facility] providers would like to encourage you to use [facility] to communicate with providers for non-urgent requests or questions.  Due to long hold times on the telephone, sending your provider a message by [facility] may be a faster and more efficient way to get a response.  Please allow 48 business hours for a response.  Please remember that this is for non-urgent requests.  _______________________________________________________  Decrease your omeprazole to every other day and taper off if possible.  You can use Pepcid over-the-counter daily or as needed.  Please follow up in 6 months.  We will do an RUQ abdominal ultrasound in February of 2023 to monitor your gallbladder polyp.  Thank you for entrusting me with your care and for choosing [facility], Dr.  Cellar

## 2021-04-10 NOTE — Progress Notes (Signed)
HPI :  75 year old female here for a follow-up visit for abdominal pain in the right upper quadrant.  She has been seen in the past for this, history of a large gastric lipoma as well as gallbladder polyps.   Recall she has had prior ultrasounds of her right upper quadrant showing no gallstones but, small gallbladder polyps.  She has had CT scan for this in 2018 showing a gastric lipoma.  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  Over time she had another CT scan 10/2020 and endoscopy 12/2020 as outlined below.  CT scan showed some interval enlargement of the gastric lipoma to 4 cm and endoscopy confirmed the lesion appears slightly bigger.  She had a subsequent CT renal stone study in July 2022 which showed stable appearance of the gastric lipoma.   Since our last visit she had a kyphoplasty for her chronic back pain.  It has been unclear if this lipoma has been causing her pain in the past versus musculoskeletal.  She states since the kyphoplasty her discomfort has improved.  She previously rated it at 7 out of 10, now around a 4 out of 10.  It has not resolved but is not bothering her as much as it used to.  She denies any nausea or vomiting.  She has been on omeprazole 40 mg daily for mild esophagitis noted on her EGD in July.  No heartburn while she has been monitored on this regimen.  Generally she is feeling better since have last seen her.  She has been using some muscle relaxers that can cause constipation but her bowels are currently not bothering her too much.  Prior workup: Colonoscopy 04/03/2017 - diverticulosis, 2 small polyps removed - one adenoma, recall 5 years for strong family history of colon cancer US abdomen 12/06/2014 - normal gallbladder, no gallstones CT scan 06/06/2017 - suspected gastric lipoma, otherwise unremarkable exam EGD 09/09/2017 - 3cm HH, large lipoma of the antrum, biopsies negative for HP   RUQ [place] 10/06/19 -  FINDINGS: Gallbladder: No gallstones or wall thickening visualized. A 4 mm non-mobile polyp is incidentally noted. No sonographic Murphy sign noted by sonographer. Common bile duct: Diameter: 4 mm, within normal limits. Liver: No focal lesion identified. Within normal limits in parenchymal echogenicity. Portal vein is patent on color Doppler imaging with normal direction of blood flow towards the liver. Other: None. IMPRESSION: No evidence of gallstones or other significant hepatobiliary abnormality     RUQ [place] 09/28/20: FINDINGS: Gallbladder: No gallstone, gallbladder wall thickening, or pericholecystic fluid. Negative sonographic Murphy's sign. There is a 5 mm gallbladder polyp. Common bile duct: Diameter: 5 mm Liver: No focal lesion identified. Within normal limits in parenchymal echogenicity. Portal vein is patent on color Doppler imaging with normal direction of blood flow towards the liver. Other: None. IMPRESSION: A 5 mm gallbladder polyp, otherwise unremarkable right upper quadrant ultrasound.     CT scan 10/17/20 - IMPRESSION: 1. No acute abnormality in the abdomen or pelvis. 2. Increased size of the 4.1 cm fat density lesion projecting into the gastric antrum, favored to represent a gastric lipoma. 3. Mild diffuse hepatic steatosis. 4. Moderate volume of formed stool throughout the colon. 5. Pelvic floor laxity with small cystocele. 6. Aortic atherosclerosis.     EGD 12/18/20 -  - A 1 cm hiatal hernia was present. - Mild esophagitis was found 31 cm from the incisors. - The exam of the esophagus  was otherwise normal. - There was a large lipoma in the gastric antrum, in close approximation to the pylorus but was not obstructing it. It appeared larger than the last time it was evaluated in 09/2017. Bite on bite biopsies were taken with a cold forceps for histology. - The exam of the stomach was otherwise normal. - Biopsies were taken with a cold forceps in the  gastric body, at the incisura and in the gastric antrum for Helicobacter pylori testing. - The duodenal bulb and second portion of the duodenum were normal.     1. Surgical [P], gastric antrum and gastric body - ANTRAL AND OXYNTIC MUCOSA WITH HYPEREMIA. - NO LIPOMATOUS TISSUE, INTESTINAL METAPLASIA, DYSPLASIA, OR CARCINOMA. [place] NEGATIVE FOR HELICOBACTER PYLORI. 2. Surgical [P], gastric antrum and gastric body - ANTRAL AND OXYNTIC MUCOSA WITH SLIGHT HYPEREMIA. - WARTHIN-STARRY NEGATIVE FOR HELICOBACTER PYLORI. - NO INTESTINAL METAPLASIA, DYSPLASIA, OR CARCINOMA.     CT renal stone study 12/08/20 - IMPRESSION: 1. No acute or active process within the abdomen or pelvis. 2. Stable gastric lipoma. 3. Simple right renal cyst.      Past Medical History:  Diagnosis Date   Arthritis    Back stiffness 03/13/2017   Pt.'s Dr. thinks it is Lupus related.   Cataract    removed both eyes   Chronic abdominal pain    Diverticulosis    Gallbladder polyp    GERD (gastroesophageal reflux disease)    History of colon polyps    HLD (hyperlipidemia)    Lipoma of stomach    Lupus ([place]) 08/28/12   Pleural effusion 09/2015     Past Surgical History:  Procedure Laterality Date   BREAST LUMPECTOMY Right    CATARACT EXTRACTION, BILATERAL     ESOPHAGOGASTRODUODENOSCOPY N/A 09/09/2017   Procedure: ESOPHAGOGASTRODUODENOSCOPY (EGD);  Surgeon: Yetta Flock, MD;  Location: [facility];  Service: Gastroenterology;  Laterality: N/A;   KYPHOPLASTY N/A 02/08/2021   Procedure: KYPHOPLASTY THORACIC 7, THORACIC 9, THORACIC 10;  Surgeon: Melina Schools, MD;  Location: [place];  Service: Orthopedics;  Laterality: N/A;   REPLACEMENT TOTAL KNEE Right    TOTAL KNEE ARTHROPLASTY Left 04/24/2020   Procedure: TOTAL KNEE ARTHROPLASTY;  Surgeon: Gaynelle Arabian, MD;  Location: [facility];  Service: Orthopedics;  Laterality: Left;  58min   UPPER GASTROINTESTINAL ENDOSCOPY     Family History  Problem  Relation Age of Onset   Asthma Mother    Colon cancer Sister    Colon polyps Brother    Other Brother        colon surgery, growth removed   Colon cancer Other        niece, deceased August 28, 2016   Esophageal cancer Neg Hx    Rectal cancer Neg Hx    Stomach cancer Neg Hx    Social History   Tobacco Use   Smoking status: Never   Smokeless tobacco: Never  Vaping Use   Vaping Use: Never used  Substance Use Topics   Alcohol use: No   Drug use: No   Current Outpatient Medications  Medication Sig Dispense Refill   azaTHIOprine (IMURAN) 50 MG tablet Take 100 mg by mouth daily.   2   carboxymethylcellul-glycerin (REFRESH OPTIVE) 0.5-0.9 % ophthalmic solution Place 1 drop into both eyes 2 (two) times daily as needed for dry eyes.     famotidine (PEPCID) 20 MG tablet TAKE 1-2 TABLETS (20-40 MG TOTAL) BY MOUTH DAILY. (Patient taking differently: Take 20 mg by mouth daily as needed  for indigestion or heartburn.) 180 tablet 1   furosemide (LASIX) 80 MG tablet Take 1 tablet (80 mg total) by mouth daily. Please have PCP manage going forward. 30 tablet 0   hydroxychloroquine (PLAQUENIL) 200 MG tablet Take 1 tablet (200 mg total) by mouth daily. (Patient taking differently: Take 400 mg by mouth daily.) 30 tablet 0   KLOR-CON M20 20 MEQ tablet TAKE 1 TABLET (20 MEQ TOTAL) BY MOUTH DAILY. PLEASE HAVE PCP MANAGE GOING FORWARD. 30 tablet 0   omeprazole (PRILOSEC) 40 MG capsule TAKE 1 CAPSULE (40 MG TOTAL) BY MOUTH DAILY. BEST TO TAKE ON AN EMPTY STOMACH 20-30 MINUTES BEFORE BREAKFAST. 90 capsule 1   predniSONE (DELTASONE) 5 MG tablet Take 5 mg by mouth daily with breakfast.     tiZANidine (ZANAFLEX) 4 MG tablet Take 1 tablet (4 mg total) by mouth every 6 (six) hours as needed for muscle spasms. 20 tablet 0   No current facility-administered medications for this visit.   Allergies  Allergen Reactions   Penicillins Shortness Of Breath and Palpitations    Has patient had a PCN reaction causing  immediate rash, facial/tongue/throat swelling, SOB or lightheadedness with hypotension: Yes Has patient had a PCN reaction causing severe rash involving mucus membranes or skin necrosis: No Has patient had a PCN reaction that required hospitalization No Has patient had a PCN reaction occurring within the last 10 years: No If all of the above answers are "NO", then may proceed with Cephalosporin use.      Review of Systems: All systems reviewed and negative except where noted in HPI.   Lab Results  Component Value Date   WBC 8.9 01/30/2021   HGB 14.5 01/30/2021   HCT 46.2 (H) 01/30/2021   MCV 93.9 01/30/2021   PLT 143 (L) 01/30/2021    Lab Results  Component Value Date   CREATININE 0.97 01/30/2021   BUN 9 01/30/2021   NA 143 01/30/2021   K 3.1 (L) 01/30/2021   CL 108 01/30/2021   CO2 28 01/30/2021    Lab Results  Component Value Date   ALT 17 12/08/2020   AST 22 12/08/2020   ALKPHOS 66 12/08/2020   BILITOT 0.8 12/08/2020     Physical Exam: BP 131/78   Pulse 73   Ht 4\' 11"  (1.499 m)   Wt 157 lb 8 oz (71.4 kg)   BMI 31.81 kg/m  Constitutional: Pleasant,well-developed, female in no acute distress. Neurological: Alert and oriented to person place and time. Psychiatric: Normal mood and affect. Behavior is normal.   ASSESSMENT AND PLAN: 75 year old female here for reassessment of the following issues:  Right upper quadrant pain Gastric lipoma GERD Gallbladder polyp Chronic back pain Family history of colon cancer  As above, gastric lipoma noted on her work-up without any other clear pathology to be causing her symptoms.  It remains unclear however if the gastric lipoma is causing symptoms at all, this is often an innocent bystander but at large sizes there is potential to cause symptoms.  She has had treatment for her chronic back pain with kyphoplasty and that appears to have helped but not resolved this.  We reviewed her imaging and endoscopy today, the lipoma  has clearly enlarged over time.  This is very likely a benign lipoma however discussed risks of enlargement, risks of malignancy thought to be low. Very high risk for endoscopic removal. I have offered her surgical evaluation if she would want to be evaluated for removal.  She has declined  that in the past and again declines today as she is feeling better.  In this light I would recommend some surveillance imaging over time to see if this continues to enlarge or not.  Otherwise she is on omeprazole for her reflux and it is working pretty well for her.  We discussed long-term risks of chronic PPI use.  Recommend she decrease to every other day dosing and see if she can taper off as tolerated and use Pepcid as primary regimen.  She is agreeable to this.  I would like to see her back in 6 months.  If she has worsening pain in the interim she should contact me.  We may consider another CT scan next summer, consider right upper quadrant ultrasound 1 year from her last exam to survey gallbladder polyp.  Plan: - try to wean down and off omeprazole if tolerated - will decrease to every other day dosing - use pepcid daily or as needed - recall RUQ [place] 09/2021 - consider repeat CT next summer, patient has declined surgical evaluation after discussion of this - follow up in the office in 6 months  Jolly Mango, MD [facility]

## 2021-04-26 DIAGNOSIS — Z20828 Contact with and (suspected) exposure to other viral communicable diseases: Secondary | ICD-10-CM | POA: Diagnosis not present

## 2021-05-22 DIAGNOSIS — M8580 Other specified disorders of bone density and structure, unspecified site: Secondary | ICD-10-CM | POA: Diagnosis not present

## 2021-05-22 DIAGNOSIS — M1990 Unspecified osteoarthritis, unspecified site: Secondary | ICD-10-CM | POA: Diagnosis not present

## 2021-05-22 DIAGNOSIS — E559 Vitamin D deficiency, unspecified: Secondary | ICD-10-CM | POA: Diagnosis not present

## 2021-05-22 DIAGNOSIS — M329 Systemic lupus erythematosus, unspecified: Secondary | ICD-10-CM | POA: Diagnosis not present

## 2021-05-22 DIAGNOSIS — M549 Dorsalgia, unspecified: Secondary | ICD-10-CM | POA: Diagnosis not present

## 2021-05-22 DIAGNOSIS — Z79899 Other long term (current) drug therapy: Secondary | ICD-10-CM | POA: Diagnosis not present

## 2021-08-21 DIAGNOSIS — M1990 Unspecified osteoarthritis, unspecified site: Secondary | ICD-10-CM | POA: Diagnosis not present

## 2021-08-21 DIAGNOSIS — Z79899 Other long term (current) drug therapy: Secondary | ICD-10-CM | POA: Diagnosis not present

## 2021-08-21 DIAGNOSIS — M25562 Pain in left knee: Secondary | ICD-10-CM | POA: Diagnosis not present

## 2021-08-21 DIAGNOSIS — E559 Vitamin D deficiency, unspecified: Secondary | ICD-10-CM | POA: Diagnosis not present

## 2021-08-21 DIAGNOSIS — M329 Systemic lupus erythematosus, unspecified: Secondary | ICD-10-CM | POA: Diagnosis not present

## 2021-08-21 DIAGNOSIS — M8580 Other specified disorders of bone density and structure, unspecified site: Secondary | ICD-10-CM | POA: Diagnosis not present

## 2021-08-23 DIAGNOSIS — M329 Systemic lupus erythematosus, unspecified: Secondary | ICD-10-CM | POA: Diagnosis not present

## 2021-08-29 ENCOUNTER — Other Ambulatory Visit: Payer: Self-pay | Admitting: Internal Medicine

## 2021-08-29 ENCOUNTER — Other Ambulatory Visit: Payer: Self-pay

## 2021-08-29 ENCOUNTER — Other Ambulatory Visit: Payer: Self-pay | Admitting: *Deleted

## 2021-08-29 ENCOUNTER — Ambulatory Visit
Admission: RE | Admit: 2021-08-29 | Discharge: 2021-08-29 | Disposition: A | Discharge status: Home / Self Care | Payer: Medicare Other | Source: Ambulatory Visit | Attending: *Deleted | Admitting: *Deleted

## 2021-08-29 DIAGNOSIS — Z1231 Encounter for screening mammogram for malignant neoplasm of breast: Secondary | ICD-10-CM

## 2021-09-07 ENCOUNTER — Telehealth: Payer: Self-pay

## 2021-09-07 DIAGNOSIS — K824 Cholesterolosis of gallbladder: Secondary | ICD-10-CM

## 2021-09-07 NOTE — Telephone Encounter (Signed)
Scheduled patient for RUQ U/S on April 24th at 10:00am, to arr at 9:30 am, NPO after midnight. Letter sent to MyChart and mailed to patient.  ?

## 2021-09-07 NOTE — Telephone Encounter (Signed)
-----   Message from Roetta Sessions, [place] sent at 07/05/2021  3:18 PM EST ----- ?Regarding: FW: RUQ ultrasound ?Correction - patient is due for U/S in 09-2021 for gallbaldder polyp ? ? ?----- Message ----- ?From: Roetta Sessions, CMA ?Sent: 07/03/2021  12:00 AM EST ?To: Roetta Sessions, CMA ?Subject: RUQ ultrasound                                ? ?Patient due for RUQ ultrasound for gallbaldder polyp in 07-2021 ? ? ? ?

## 2021-10-01 ENCOUNTER — Ambulatory Visit (HOSPITAL_COMMUNITY)
Admission: RE | Admit: 2021-10-01 | Discharge: 2021-10-01 | Disposition: A | Discharge status: Home / Self Care | Payer: Medicare Other | Source: Ambulatory Visit | Attending: Gastroenterology | Admitting: Gastroenterology

## 2021-10-01 DIAGNOSIS — K824 Cholesterolosis of gallbladder: Secondary | ICD-10-CM | POA: Insufficient documentation

## 2021-11-28 ENCOUNTER — Encounter: Payer: Self-pay | Admitting: Gastroenterology

## 2021-11-28 ENCOUNTER — Ambulatory Visit (INDEPENDENT_AMBULATORY_CARE_PROVIDER_SITE_OTHER): Payer: Medicare Other | Admitting: Gastroenterology

## 2021-11-28 DIAGNOSIS — R1011 Right upper quadrant pain: Secondary | ICD-10-CM | POA: Diagnosis not present

## 2021-11-28 DIAGNOSIS — K824 Cholesterolosis of gallbladder: Secondary | ICD-10-CM

## 2021-11-28 DIAGNOSIS — D175 Benign lipomatous neoplasm of intra-abdominal organs: Secondary | ICD-10-CM | POA: Diagnosis not present

## 2021-11-28 DIAGNOSIS — K219 Gastro-esophageal reflux disease without esophagitis: Secondary | ICD-10-CM

## 2021-11-28 DIAGNOSIS — Z800 Family history of malignant neoplasm of digestive organs: Secondary | ICD-10-CM

## 2021-11-28 NOTE — Patient Instructions (Signed)
If you are age 74 or older, your body mass index should be between 23-30. Your Body mass index is 33.41 kg/m. If this is out of the aforementioned range listed, please consider follow up with your Primary Care Provider. ________________________________________________________  The [facility] providers would like to encourage you to use [facility] to communicate with providers for non-urgent requests or questions.  Due to long hold times on the telephone, sending your provider a message by [facility] may be a faster and more efficient way to get a response.  Please allow 48 business hours for a response.  Please remember that this is for non-urgent requests.  _______________________________________________________  Jane Chavez have been scheduled for a CT scan of the abdomen and pelvis at [facility], 1st floor Radiology. You are scheduled on 12-12-21  at [place] should arrive 30 minutes prior to your appointment time for registration.  The solution may taste better if refrigerated, but do NOT add ice or any other liquid to this solution. Shake well before drinking.   Please follow the written instructions below on the day of your exam:   1) Do not eat anything after 5am (4 hours prior to your test)   2) Drink 1 bottle of contrast @ 7am (2 hours prior to your exam)  Remember to shake well before drinking and do NOT pour over ice.     Drink 1 bottle of contrast @ 8am (1 hour prior to your exam)   You may take any medications as prescribed with a small amount of water, if necessary. If you take any of the following medications: METFORMIN, GLUCOPHAGE, GLUCOVANCE, AVANDAMET, RIOMET, FORTAMET, [place] MET, JANUMET, GLUMETZA or METAGLIP, you MAY be asked to HOLD this medication 48 hours AFTER the exam.   The purpose of you drinking the oral contrast is to aid in the visualization of your intestinal tract. The contrast solution may cause some diarrhea. Depending on your individual set of symptoms, you may also  receive an intravenous injection of x-ray contrast/dye. Plan on being at [facility] for 45 minutes or longer, depending on the type of exam you are having performed.   If you have any questions regarding your exam or if you need to reschedule, you may call [facility] at [phone] between the hours of 8:00 am and 5:00 pm, Monday-Friday.   Due to recent changes in healthcare laws, you may see the results of your imaging and laboratory studies on MyChart before your provider has had a chance to review them.  We understand that in some cases there may be results that are confusing or concerning to you. Not all laboratory results come back in the same time frame and the provider may be waiting for multiple results in order to interpret others.  Please give [place] 48 hours in order for your provider to thoroughly review all the results before contacting the office for clarification of your results.   You will need a colonoscopy in October 2023.  We will contact you to schedule this.  You will need a surveillance RUQ ultrasound in April 2024.  We will contact you to schedule this.  Thank you for entrusting me with your care and choosing [facility].  Dr Havery Moros

## 2021-11-28 NOTE — Progress Notes (Signed)
HPI :  76 year old female here for a follow-up visit for abdominal pain in the right upper quadrant.  She has been seen in the past for this, history of a large gastric lipoma as well as gallbladder polyps.   Recall she has had prior ultrasounds of her right upper quadrant showing no gallstones but, small gallbladder polyps.  She has had CT scan for this in 2018 showing a gastric lipoma.  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  Over time she had another CT scan 10/2020 and endoscopy 12/2020 as outlined below.  CT scan showed some interval enlargement of the gastric lipoma to 4 cm and endoscopy confirmed the lesion appears slightly bigger.  She had a subsequent CT renal stone study in July 2022 which showed stable appearance of the gastric lipoma.  She states she has been feeling about the same in regards to her abdomen since have last seen her.  She has had this persistent sensation in her right upper quadrant to epigastric area.  She states its not really a pain more of an annoyance or pressure.  She does not feel like there is been any change in this symptom since have last seen her.  She states has been ongoing and stable for the past few years.  Seems to be present most of the time but can come and go.  Eating does not seem to be related to it at all.  She denies any unintentional weight loss.  She has been eating well.  She states she recently had labs by her pulmonologist which were normal.  She takes omeprazole 40 mg as needed for reflux.  Previously she was on this routinely but states she does not need to take it all the time at this point.  Works well when she takes it, she has made some dietary changes which has helped reduce her reflux symptoms.  She had a small gallbladder polyp that has been surveyed with ultrasound this past April.  It has not changed in size but radiology recommended a repeat exam in 1 year.  She has questions about the  timing of her next colonoscopy.  Recall her sister had colon cancer, at one point she thought her brother had had it but appears he may have had bladder cancer.  Her niece had colon cancer at a very young age in her 36s.  She had a small adenoma removed in October 2018, she has been having surveillance colonoscopy every 5 years.  Since have last seen her daughter was diagnosed with breast cancer now on chemotherapy.  She states she has had some slight change in her stool form, appears to be smaller and formed but continues to have normal frequency of 2-3 times a day without any blood or alarm symptoms.  Prior workup: Colonoscopy 04/03/2017 - diverticulosis, 2 small polyps removed - one adenoma, recall 5 years for strong family history of colon cancer US abdomen 12/06/2014 - normal gallbladder, no gallstones CT scan 06/06/2017 - suspected gastric lipoma, otherwise unremarkable exam EGD 09/09/2017 - 3cm HH, large lipoma of the antrum, biopsies negative for HP    CT scan 10/17/20 - IMPRESSION: 1. No acute abnormality in the abdomen or pelvis. 2. Increased size of the 4.1 cm fat density lesion projecting into the gastric antrum, favored to represent a gastric lipoma. 3. Mild diffuse hepatic steatosis. 4. Moderate volume of formed stool throughout the colon. 5. Pelvic floor laxity with  small cystocele. 6. Aortic atherosclerosis.     EGD 12/18/20 -  - A 1 cm hiatal hernia was present. - Mild esophagitis was found 31 cm from the incisors. - The exam of the esophagus was otherwise normal. - There was a large lipoma in the gastric antrum, in close approximation to the pylorus but was not obstructing it. It appeared larger than the last time it was evaluated in 09/2017. Bite on bite biopsies were taken with a cold forceps for histology. - The exam of the stomach was otherwise normal. - Biopsies were taken with a cold forceps in the gastric body, at the incisura and in the gastric antrum for Helicobacter  pylori testing. - The duodenal bulb and second portion of the duodenum were normal.     1. Surgical [P], gastric antrum and gastric body - ANTRAL AND OXYNTIC MUCOSA WITH HYPEREMIA. - NO LIPOMATOUS TISSUE, INTESTINAL METAPLASIA, DYSPLASIA, OR CARCINOMA. Hinton Dyer NEGATIVE FOR HELICOBACTER PYLORI. 2. Surgical [P], gastric antrum and gastric body - ANTRAL AND OXYNTIC MUCOSA WITH SLIGHT HYPEREMIA. - WARTHIN-STARRY NEGATIVE FOR HELICOBACTER PYLORI. - NO INTESTINAL METAPLASIA, DYSPLASIA, OR CARCINOMA.     CT renal stone study 12/08/20 - IMPRESSION: 1. No acute or active process within the abdomen or pelvis. 2. Stable gastric lipoma. 3. Simple right renal cyst.    RUQ [place] 10/01/21: IMPRESSION: 1. The 4 mm polyp in the gallbladder is stable. Recommend a follow-up ultrasound in 1 year to ensure stability. 2. No other abnormalities are identified.   Past Medical History:  Diagnosis Date   Arthritis    Back stiffness 03/13/2017   Pt.'s Dr. thinks it is Lupus related.   Cataract    removed both eyes   Chronic abdominal pain    Diverticulosis    Gallbladder polyp    GERD (gastroesophageal reflux disease)    History of colon polyps    HLD (hyperlipidemia)    Lipoma of stomach    Lupus ([place]) Sep 08, 2012   Pleural effusion 09/2015     Past Surgical History:  Procedure Laterality Date   BREAST EXCISIONAL BIOPSY Right    CATARACT EXTRACTION, BILATERAL     ESOPHAGOGASTRODUODENOSCOPY N/A 09/09/2017   Procedure: ESOPHAGOGASTRODUODENOSCOPY (EGD);  Surgeon: Yetta Flock, MD;  Location: [facility];  Service: Gastroenterology;  Laterality: N/A;   KYPHOPLASTY N/A 02/08/2021   Procedure: KYPHOPLASTY THORACIC 7, THORACIC 9, THORACIC 10;  Surgeon: Melina Schools, MD;  Location: [place];  Service: Orthopedics;  Laterality: N/A;   REPLACEMENT TOTAL KNEE Right    TOTAL KNEE ARTHROPLASTY Left 04/24/2020   Procedure: TOTAL KNEE ARTHROPLASTY;  Surgeon: Gaynelle Arabian, MD;  Location: [facility];   Service: Orthopedics;  Laterality: Left;  28mn   UPPER GASTROINTESTINAL ENDOSCOPY     Family History  Problem Relation Age of Onset   Asthma Mother    Colon cancer Sister    Colon polyps Brother    Other Brother        colon surgery, growth removed   Bladder Cancer Brother    Colon cancer Other        niece, deceased M04/06/2016  Breast cancer Daughter    Esophageal cancer Neg Hx    Rectal cancer Neg Hx    Stomach cancer Neg Hx    Social History   Tobacco Use   Smoking status: Never   Smokeless tobacco: Never  Vaping Use   Vaping Use: Never used  Substance Use Topics   Alcohol use: No   Drug use:  No   Current Outpatient Medications  Medication Sig Dispense Refill   azaTHIOprine (IMURAN) 50 MG tablet Take 100 mg by mouth daily.   2   carboxymethylcellul-glycerin (REFRESH OPTIVE) 0.5-0.9 % ophthalmic solution Place 1 drop into both eyes 2 (two) times daily as needed for dry eyes.     famotidine (PEPCID) 20 MG tablet Take 1 tablet (20 mg total) by mouth daily as needed for indigestion or heartburn.     furosemide (LASIX) 80 MG tablet Take 1 tablet (80 mg total) by mouth daily. Please have PCP manage going forward. 30 tablet 0   hydroxychloroquine (PLAQUENIL) 200 MG tablet Take 1 tablet (200 mg total) by mouth daily. (Patient taking differently: Take 400 mg by mouth daily.) 30 tablet 0   KLOR-CON M20 20 MEQ tablet TAKE 1 TABLET (20 MEQ TOTAL) BY MOUTH DAILY. PLEASE HAVE PCP MANAGE GOING FORWARD. 30 tablet 0   omeprazole (PRILOSEC) 40 MG capsule Take 1 capsule (40 mg total) by mouth every other day. Taper off if possible 15 capsule 0   predniSONE (DELTASONE) 5 MG tablet Take 5 mg by mouth daily with breakfast.     tiZANidine (ZANAFLEX) 4 MG tablet Take 1 tablet (4 mg total) by mouth every 6 (six) hours as needed for muscle spasms. 20 tablet 0   No current facility-administered medications for this visit.   Allergies  Allergen Reactions   Penicillins Shortness Of Breath and  Palpitations    Has patient had a PCN reaction causing immediate rash, facial/tongue/throat swelling, SOB or lightheadedness with hypotension: Yes Has patient had a PCN reaction causing severe rash involving mucus membranes or skin necrosis: No Has patient had a PCN reaction that required hospitalization No Has patient had a PCN reaction occurring within the last 10 years: No If all of the above answers are "NO", then may proceed with Cephalosporin use.      Review of Systems: All systems reviewed and negative except where noted in HPI.     Physical Exam: BP 104/70   Pulse 77   Ht '4\' 9"'$  (1.448 m)   Wt 154 lb 6 oz (70 kg)   SpO2 99%   BMI 33.41 kg/m  Constitutional: Pleasant,well-developed, female in no acute distress. Abdominal: Soft, nondistended, nontender.  There are no masses palpable. Neurological: Alert and oriented to person place and time. Skin: Skin is warm and dry. No rashes noted. Psychiatric: Normal mood and affect. Behavior is normal.   ASSESSMENT AND PLAN: 76 year old female here for reassessment the following:  Right upper quadrant to epigastric discomfort Gastric lipoma GERD Gallbladder polyp Family history of colon cancer  Persistent sensation in her upper abdomen but no pain associated with this.  Her work-up has led to finding of a gastric lipoma which is large, and has increased in size over years, however it remains unclear if this is an innocent bystander or actually related to her symptoms (as large gastric lipomas can cause symptoms in some patients).  She has no alarm symptoms and otherwise feels at baseline.  I reviewed her prior CT scans with her.  She has declined surgical evaluation for the gastric lipoma in the past.  Given interval growth over time on prior imaging studies, I think her another surveillance imaging study is reasonable at this point time, about a year from her last exam.  I offered her a CT scan of the abdomen with contrast and she  wants to proceed with that.  If the lesion continues to grow /  enlarge, could consider an EUS to make sure no concerning findings vs. surgical evaluation, although we would want to avoid surgery for this if at all possible and she is in agreement with that, especially if benign.   Since of last seen her she is doing much better with her reflux, making some dietary changes which has minimized her symptoms.  She is using omeprazole only as needed.  She has a stable appearing gallbladder polyp on surveillance imaging, radiology is recommending another ultrasound of next April.  She is otherwise due for her next surveillance colonoscopy this October, she wants to proceed with that as well.  Plan: - CT abdomen with contrast survey for interval enlargement of large gastric lipoma - plan on colonoscopy this October 2023 - surveillance RUQ [place] 09/2022  I will contact her with results of her CT scan, with further recommendations at that time.  She will contact me in the interim with any questions.  Jolly Mango, MD [facility]

## 2021-12-06 DIAGNOSIS — H40013 Open angle with borderline findings, low risk, bilateral: Secondary | ICD-10-CM | POA: Diagnosis not present

## 2021-12-06 DIAGNOSIS — Z961 Presence of intraocular lens: Secondary | ICD-10-CM | POA: Diagnosis not present

## 2021-12-06 DIAGNOSIS — Z79899 Other long term (current) drug therapy: Secondary | ICD-10-CM | POA: Diagnosis not present

## 2021-12-06 DIAGNOSIS — H26493 Other secondary cataract, bilateral: Secondary | ICD-10-CM | POA: Diagnosis not present

## 2021-12-12 ENCOUNTER — Ambulatory Visit (HOSPITAL_COMMUNITY)
Admission: RE | Admit: 2021-12-12 | Discharge: 2021-12-12 | Disposition: A | Discharge status: Home / Self Care | Payer: Medicare Other | Source: Ambulatory Visit | Attending: Gastroenterology | Admitting: Gastroenterology

## 2021-12-12 ENCOUNTER — Encounter (HOSPITAL_COMMUNITY): Payer: Self-pay

## 2021-12-12 DIAGNOSIS — D175 Benign lipomatous neoplasm of intra-abdominal organs: Secondary | ICD-10-CM | POA: Diagnosis not present

## 2021-12-12 DIAGNOSIS — I700 Atherosclerosis of aorta: Secondary | ICD-10-CM | POA: Diagnosis not present

## 2021-12-12 DIAGNOSIS — N281 Cyst of kidney, acquired: Secondary | ICD-10-CM | POA: Diagnosis not present

## 2021-12-12 DIAGNOSIS — K3189 Other diseases of stomach and duodenum: Secondary | ICD-10-CM | POA: Diagnosis not present

## 2021-12-12 LAB — POCT I-STAT CREATININE: Creatinine, Ser: 0.8 mg/dL (ref 0.44–1.00)

## 2021-12-12 MED ORDER — IOHEXOL 300 MG/ML  SOLN
100.0000 mL | Freq: Once | INTRAMUSCULAR | Status: AC | PRN
  Administered 2021-12-12: 100 mL via INTRAVENOUS

## 2021-12-31 ENCOUNTER — Encounter: Payer: Self-pay | Admitting: Gastroenterology

## 2022-01-18 DIAGNOSIS — H26491 Other secondary cataract, right eye: Secondary | ICD-10-CM | POA: Diagnosis not present

## 2022-03-29 DIAGNOSIS — R7303 Prediabetes: Secondary | ICD-10-CM | POA: Diagnosis not present

## 2022-03-29 DIAGNOSIS — Z0000 Encounter for general adult medical examination without abnormal findings: Secondary | ICD-10-CM | POA: Diagnosis not present

## 2022-03-29 DIAGNOSIS — Z23 Encounter for immunization: Secondary | ICD-10-CM | POA: Diagnosis not present

## 2022-04-10 ENCOUNTER — Telehealth: Payer: Self-pay | Admitting: Gastroenterology

## 2022-04-10 DIAGNOSIS — M1990 Unspecified osteoarthritis, unspecified site: Secondary | ICD-10-CM | POA: Diagnosis not present

## 2022-04-10 DIAGNOSIS — M329 Systemic lupus erythematosus, unspecified: Secondary | ICD-10-CM | POA: Diagnosis not present

## 2022-04-10 DIAGNOSIS — Z79899 Other long term (current) drug therapy: Secondary | ICD-10-CM | POA: Diagnosis not present

## 2022-04-10 DIAGNOSIS — M8580 Other specified disorders of bone density and structure, unspecified site: Secondary | ICD-10-CM | POA: Diagnosis not present

## 2022-04-10 DIAGNOSIS — E559 Vitamin D deficiency, unspecified: Secondary | ICD-10-CM | POA: Diagnosis not present

## 2022-04-10 NOTE — Telephone Encounter (Signed)
Patient called states she would like to have double colon and egd at the same time. Requesting a call back.

## 2022-04-10 NOTE — Telephone Encounter (Signed)
Jane Chavez, patient is due for an office visit at this time. There is a recall in the system for a 41-monthfollow up. Dr. AHavery Moroscan discuss wether or not she will need EGD or not. Please schedule, thanks.

## 2022-06-11 ENCOUNTER — Ambulatory Visit (INDEPENDENT_AMBULATORY_CARE_PROVIDER_SITE_OTHER): Payer: Medicare Other | Admitting: Gastroenterology

## 2022-06-11 ENCOUNTER — Encounter: Payer: Self-pay | Admitting: Gastroenterology

## 2022-06-11 ENCOUNTER — Ambulatory Visit: Payer: BLUE CROSS/BLUE SHIELD | Admitting: Gastroenterology

## 2022-06-11 VITALS — BP 98/76 | HR 98 | Ht <= 58 in | Wt 155.1 lb

## 2022-06-11 DIAGNOSIS — K219 Gastro-esophageal reflux disease without esophagitis: Secondary | ICD-10-CM | POA: Diagnosis not present

## 2022-06-11 DIAGNOSIS — D175 Benign lipomatous neoplasm of intra-abdominal organs: Secondary | ICD-10-CM

## 2022-06-11 DIAGNOSIS — K824 Cholesterolosis of gallbladder: Secondary | ICD-10-CM | POA: Diagnosis not present

## 2022-06-11 DIAGNOSIS — R1011 Right upper quadrant pain: Secondary | ICD-10-CM

## 2022-06-11 DIAGNOSIS — Z800 Family history of malignant neoplasm of digestive organs: Secondary | ICD-10-CM

## 2022-06-11 MED ORDER — SUTAB 1479-225-188 MG PO TABS: 1.0000 | ORAL_TABLET | ORAL | 0 refills | Status: DC

## 2022-06-11 NOTE — Patient Instructions (Addendum)
If you are age 75 or older, your body mass index should be between 23-30. Your Body mass index is 33.28 kg/m. If this is out of the aforementioned range listed, please consider follow up with your Primary Care Provider.  If you are age 75 or younger, your body mass index should be between 19-25. Your Body mass index is 33.28 kg/m. If this is out of the aformentioned range listed, please consider follow up with your Primary Care Provider.   ________________________________________________________   Jane Chavez have been scheduled for a colonoscopy. Please follow written instructions given to you at your visit today.  Please pick up your prep supplies at the pharmacy within the next 1-3 days. If you use inhalers (even only as needed), please bring them with you on the day of your procedure.  You will be due for an abdominal ultrasound in April 2024.  We will contact you when it is time to schedule the this scan.  Thank you for entrusting me with your care and for choosing [facility] Dr. Century Cellar

## 2022-06-11 NOTE — Progress Notes (Signed)
HPI :  77 year old female here for a follow-up visit for abdominal pain in the right upper quadrant.  She has been seen in the past for this, history of a large gastric lipoma as well as gallbladder polyps. She also has a strong FH of colon cancer.   Recall she has had prior ultrasounds of her right upper quadrant showing no gallstones but, small gallbladder polyps.  She has had CT scan for this in 2018 showing a gastric lipoma.  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  Over time she had another CT scan 10/2020 and endoscopy 12/2020 as outlined below.  CT scan showed some interval enlargement of the gastric lipoma to 4 cm and endoscopy confirmed the lesion appears slightly bigger.  She had a subsequent CT renal stone study in July 2022 which showed stable appearance of the gastric lipoma.  More recently given her symptoms we performed a follow-up CT scan in July 2023 which did show some mildly increased in size of the gastric lipoma as outlined below.  She continues to feel a "annoyance" in her epigastric to right upper quadrant.  She states she will more so feel it after she eats although it can occur at any point in time.  She states it is intermittent.  She states it is not a severe pain, more of a pressure, bothering her similarly to as it has in the past.  She denies any nausea or vomiting or obstructive symptoms.  She has a history of GERD but has not been using omeprazole at all.  She uses Pepcid occasionally.  Her bowels are fairly regular, she denies any diarrhea or constipation.  Denies any cardiopulmonary symptoms.  Otherwise is feeling well.  Recall radiology had recommended a surveillance ultrasound for her gallbladder polyp.  Recall her sister had colon cancer. Her niece had colon cancer at a very young age in her 11s.  She had a small adenoma removed in October 2018, she has been having surveillance colonoscopy every 5 years.     Prior  workup: Colonoscopy 04/03/2017 - diverticulosis, 2 small polyps removed - one adenoma, recall 5 years for strong family history of colon cancer US abdomen 12/06/2014 - normal gallbladder, no gallstones CT scan 06/06/2017 - suspected gastric lipoma, otherwise unremarkable exam EGD 09/09/2017 - 3cm HH, large lipoma of the antrum, biopsies negative for HP    CT scan 10/17/20 - IMPRESSION: 1. No acute abnormality in the abdomen or pelvis. 2. Increased size of the 4.1 cm fat density lesion projecting into the gastric antrum, favored to represent a gastric lipoma. 3. Mild diffuse hepatic steatosis. 4. Moderate volume of formed stool throughout the colon. 5. Pelvic floor laxity with small cystocele. 6. Aortic atherosclerosis.     EGD 12/18/20 -  - A 1 cm hiatal hernia was present. - Mild esophagitis was found 31 cm from the incisors. - The exam of the esophagus was otherwise normal. - There was a large lipoma in the gastric antrum, in close approximation to the pylorus but was not obstructing it. It appeared larger than the last time it was evaluated in 09/2017. Bite on bite biopsies were taken with a cold forceps for histology. - The exam of the stomach was otherwise normal. - Biopsies were taken with a cold forceps in the gastric body, at the incisura and in the gastric antrum for Helicobacter pylori testing. - The duodenal bulb and second portion of the duodenum  were normal.     1. Surgical [P], gastric antrum and gastric body - ANTRAL AND OXYNTIC MUCOSA WITH HYPEREMIA. - NO LIPOMATOUS TISSUE, INTESTINAL METAPLASIA, DYSPLASIA, OR CARCINOMA. Hinton Dyer NEGATIVE FOR HELICOBACTER PYLORI. 2. Surgical [P], gastric antrum and gastric body - ANTRAL AND OXYNTIC MUCOSA WITH SLIGHT HYPEREMIA. - WARTHIN-STARRY NEGATIVE FOR HELICOBACTER PYLORI. - NO INTESTINAL METAPLASIA, DYSPLASIA, OR CARCINOMA.     CT renal stone study 12/08/20 - IMPRESSION: 1. No acute or active process within the abdomen or  pelvis. 2. Stable gastric lipoma. 3. Simple right renal cyst.     RUQ [place] 10/01/21: IMPRESSION: 1. The 4 mm polyp in the gallbladder is stable. Recommend a follow-up ultrasound in 1 year to ensure stability. 2. No other abnormalities are identified.    CT abdomen / pelvis with contrast 12/12/21: IMPRESSION: 1. No acute findings within the abdomen or pelvis. 2. Uniformly fat attenuating mass within the gastric antrum is again noted compatible with gastric lipoma. Mildly increased in size from previous exam as detailed above. 3. Small cystocele. 4. Treated compression deformities involving T10 and T11. Mild inferior endplate compression deformities are identified involving the L2 and L4 vertebral bodies which appear new compared with previous exam on 12/08/2020. Loss of approximately 15% of the vertebral body height is noted at these levels. 5.  Aortic Atherosclerosis (ICD10-I70.0).   Past Medical History:  Diagnosis Date   Arthritis    Back stiffness 03/13/2017   Pt.'s Dr. thinks it is Lupus related.   Cataract    removed both eyes   Chronic abdominal pain    Diverticulosis    Gallbladder polyp    GERD (gastroesophageal reflux disease)    History of colon polyps    HLD (hyperlipidemia)    Lipoma of stomach    Lupus ([place]) September 10, 2012   Pleural effusion 09/2015     Past Surgical History:  Procedure Laterality Date   BREAST EXCISIONAL BIOPSY Right    CATARACT EXTRACTION, BILATERAL     ESOPHAGOGASTRODUODENOSCOPY N/A 09/09/2017   Procedure: ESOPHAGOGASTRODUODENOSCOPY (EGD);  Surgeon: Yetta Flock, MD;  Location: [facility];  Service: Gastroenterology;  Laterality: N/A;   KYPHOPLASTY N/A 02/08/2021   Procedure: KYPHOPLASTY THORACIC 7, THORACIC 9, THORACIC 10;  Surgeon: Melina Schools, MD;  Location: [place];  Service: Orthopedics;  Laterality: N/A;   REPLACEMENT TOTAL KNEE Right    TOTAL KNEE ARTHROPLASTY Left 04/24/2020   Procedure: TOTAL KNEE ARTHROPLASTY;  Surgeon:  Gaynelle Arabian, MD;  Location: [facility];  Service: Orthopedics;  Laterality: Left;  66mn   UPPER GASTROINTESTINAL ENDOSCOPY     Family History  Problem Relation Age of Onset   Asthma Mother    Colon cancer Sister    Colon polyps Brother    Other Brother        colon surgery, growth removed   Bladder Cancer Brother    Breast cancer Daughter        double masectomy   Colon cancer Other        niece, deceased M2018-04-03  Esophageal cancer Neg Hx    Rectal cancer Neg Hx    Stomach cancer Neg Hx    Social History   Tobacco Use   Smoking status: Never   Smokeless tobacco: Never  Vaping Use   Vaping Use: Never used  Substance Use Topics   Alcohol use: No   Drug use: No   Current Outpatient Medications  Medication Sig Dispense Refill   azaTHIOprine (IMURAN) 50 MG tablet Take 100  mg by mouth daily.   2   carboxymethylcellul-glycerin (REFRESH OPTIVE) 0.5-0.9 % ophthalmic solution Place 1 drop into both eyes 2 (two) times daily as needed for dry eyes.     furosemide (LASIX) 80 MG tablet Take 1 tablet (80 mg total) by mouth daily. Please have PCP manage going forward. 30 tablet 0   hydroxychloroquine (PLAQUENIL) 200 MG tablet Take 1 tablet (200 mg total) by mouth daily. (Patient taking differently: Take 400 mg by mouth daily.) 30 tablet 0   KLOR-CON M20 20 MEQ tablet TAKE 1 TABLET (20 MEQ TOTAL) BY MOUTH DAILY. PLEASE HAVE PCP MANAGE GOING FORWARD. 30 tablet 0   omeprazole (PRILOSEC) 40 MG capsule Take 1 capsule (40 mg total) by mouth every other day. Taper off if possible 15 capsule 0   Potassium Chloride ER 20 MEQ TBCR Take 10 tablets by mouth daily.     predniSONE (DELTASONE) 5 MG tablet Take 5 mg by mouth daily with breakfast.     tiZANidine (ZANAFLEX) 4 MG tablet Take 1 tablet (4 mg total) by mouth every 6 (six) hours as needed for muscle spasms. 20 tablet 0   famotidine (PEPCID) 20 MG tablet Take 1 tablet (20 mg total) by mouth daily as needed for indigestion or heartburn. (Patient  not taking: Reported on 06/11/2022)     No current facility-administered medications for this visit.   Allergies  Allergen Reactions   Penicillins Shortness Of Breath and Palpitations    Has patient had a PCN reaction causing immediate rash, facial/tongue/throat swelling, SOB or lightheadedness with hypotension: Yes Has patient had a PCN reaction causing severe rash involving mucus membranes or skin necrosis: No Has patient had a PCN reaction that required hospitalization No Has patient had a PCN reaction occurring within the last 10 years: No If all of the above answers are "NO", then may proceed with Cephalosporin use.      Review of Systems: All systems reviewed and negative except where noted in HPI.   Lab Results  Component Value Date   WBC 8.9 01/30/2021   HGB 14.5 01/30/2021   HCT 46.2 (H) 01/30/2021   MCV 93.9 01/30/2021   PLT 143 (L) 01/30/2021    Lab Results  Component Value Date   CREATININE 0.80 12/12/2021   BUN 9 01/30/2021   NA 143 01/30/2021   K 3.1 (L) 01/30/2021   CL 108 01/30/2021   CO2 28 01/30/2021    Lab Results  Component Value Date   ALT 17 12/08/2020   AST 22 12/08/2020   ALKPHOS 66 12/08/2020   BILITOT 0.8 12/08/2020     Physical Exam: BP 98/76   Pulse 98   Ht 4' 9.25" (1.454 m)   Wt 155 lb 2 oz (70.4 kg)   BMI 33.28 kg/m  Constitutional: Pleasant,well-developed, female in no acute distress. Neurological: Alert and oriented to person place and time. Psychiatric: Normal mood and affect. Behavior is normal.   ASSESSMENT: 77 y.o. female here for assessment of the following  1. Lipoma of stomach   2. RUQ pain   3. Gallbladder polyp   4. Gastroesophageal reflux disease without esophagitis   5. Family history of colon cancer    Reviewed her imaging with her.  It remains unclear if the lipoma in her stomach is causing any of her upper tract symptoms however it is possible.  We discussed that treatment of this lipoma would likely  entail surgery, although EUS with unroofing potentially could be considered.  Given interval  enlargement of this lesion over time, I will discuss her case with Dr. Rush Landmark of advanced endoscopy to get his opinion on whether or not endoscopic unroofing would be considered at all for this.  I think surgery would be a very aggressive measure for what is very likely benign disease however again it is unclear if it is causing her symptoms or not.  I do not think she has biliary colic given intermittent nature of this and not always related to eating, although due for surveillance ultrasound in a few months to assess for gallbladder polyp stability.  We may consider interval imaging over time given enlargement of this lesion over time, she has had multiple CT scans and want to minimize amount of radiation she receives.  Again CT has suggested benign lipoma, malignant change seems less likely.  I will get back to her once I hear back from Dr. Rush Landmark, suspect we may pursue just interval monitoring unless her symptoms really worsen can consider interval EGD and/or surgical evaluation  Due for ultrasound in April 2024.  She is off PPI and GERD symptoms controlled with Pepcid, she will continue that for now, no evidence of Barrett's.  Otherwise given her strong family history of colon cancer she is due for surveillance colonoscopy.  Discussed risk benefits and she wants to proceed.  Further recommendations pending results.   PLAN: - will review her case with advanced endoscopy in regards to large gastric lipoma. Patient is not interested in pursuing surgery. This is very likely benign disease although has had some interval enlargement - consider interval CT if no endoscopic evaluation done in the interim. It remains unclear if this is related / causing her symptoms or not - RUQ [place] in April 2024 - using PPI or pepcid PRN, no BE - schedule colonoscopy at the [facility]

## 2022-06-13 ENCOUNTER — Telehealth: Payer: Self-pay | Admitting: Gastroenterology

## 2022-06-13 NOTE — Telephone Encounter (Signed)
[place] can you let the patient know the following: I spoke with Dr. Rush Landmark about her case. One could consider endoscopic unroofing of the gastric lipoma to see if that could help reduce size / her symptoms, however he personally does not do that (would require ESD capability). If that is considered would be a tertiary care referral at a center such as [facility]. It also remains unclear if this is even causing her symptoms or not, and if this would help her, but given we haven't found another source, it's possible.   Options at this time are to consider referral to [facility] for their opinion for endoscopic treatment, consider interval imaging with another CT scan perhaps this summer (July) with continued monitoring, or a referral to a surgeon (which I think would be the last option). If she wants an opinion about potential endoscopic therapy for this I can refer her to [facility] for their opinion - I can't guarantee they would do this or offer it, but may be worthwhile to have that conversation with them and is much lower risk than a surgery. Can you let me know what she thinks? Thanks. If she declines a referral to [facility] I would see her back in June for repeat imaging. Thanks

## 2022-06-14 NOTE — Telephone Encounter (Addendum)
Called and spoke with patient regarding Dr. Doyne Keel recommendations. Patient declined [facility] and surgical referral at this time. Patient states that she would like to continue monitoring with interval imaging. Pt reports that if she begin to have symptoms again then she will likely pursue referral to [facility] for 2nd opinion. Patient is aware that she will need a follow up in June and she will receive a reminder letter closer to that time. Pt is aware that she will be due for repeat CT around June or July as well. Pt verbalized understanding and had no concerns at the end of the call.  June recall office visit in [place].

## 2022-07-14 ENCOUNTER — Encounter: Payer: Self-pay | Admitting: Certified Registered Nurse Anesthetist

## 2022-07-19 ENCOUNTER — Encounter: Payer: Self-pay | Admitting: Gastroenterology

## 2022-07-19 ENCOUNTER — Ambulatory Visit: Payer: Medicare Other | Admitting: Gastroenterology

## 2022-07-19 VITALS — BP 135/81 | HR 80 | Temp 95.9°F | Resp 16 | Ht <= 58 in | Wt 155.0 lb

## 2022-07-19 DIAGNOSIS — Z09 Encounter for follow-up examination after completed treatment for conditions other than malignant neoplasm: Secondary | ICD-10-CM

## 2022-07-19 DIAGNOSIS — Z86010 Personal history of colonic polyps: Secondary | ICD-10-CM

## 2022-07-19 DIAGNOSIS — D120 Benign neoplasm of cecum: Secondary | ICD-10-CM

## 2022-07-19 DIAGNOSIS — D123 Benign neoplasm of transverse colon: Secondary | ICD-10-CM

## 2022-07-19 DIAGNOSIS — K635 Polyp of colon: Secondary | ICD-10-CM | POA: Diagnosis not present

## 2022-07-19 DIAGNOSIS — Z800 Family history of malignant neoplasm of digestive organs: Secondary | ICD-10-CM

## 2022-07-19 MED ORDER — AMBULATORY NON FORMULARY MEDICATION: 0 refills | Status: DC

## 2022-07-19 MED ORDER — SODIUM CHLORIDE 0.9 % IV SOLN
500.0000 mL | Freq: Once | INTRAVENOUS | Status: DC
Start: 2022-07-19 — End: 2022-07-19

## 2022-07-19 NOTE — Progress Notes (Signed)
1335 HR > 100 with esmolol 25 mg given IV, MD updated, vss  

## 2022-07-19 NOTE — Progress Notes (Signed)
Called to room to assist during endoscopic procedure.  Patient ID and intended procedure confirmed with present staff. Received instructions for my participation in the procedure from the performing physician.  

## 2022-07-19 NOTE — Op Note (Signed)
[facility] Patient Name: Jane Chavez Procedure Date: 07/19/2022 12:27 PM MRN: [id number] Endoscopist: Remo Lipps P. Havery Moros , MD, [id number] Age: 77 Referring MD:  Date of Birth: [birth year] Gender: Female Account #: [id number] Procedure:                Colonoscopy Indications:              Screening in patient at increased risk: Family                            history of 1st-degree relative with colorectal                            cancer (sister) as well as niece. Last exam 03/2017                            - adenoma Medicines:                Monitored Anesthesia Care Procedure:                Pre-Anesthesia Assessment:                           - Prior to the procedure, a History and Physical                            was performed, and patient medications and                            allergies were reviewed. The patient's tolerance of                            previous anesthesia was also reviewed. The risks                            and benefits of the procedure and the sedation                            options and risks were discussed with the patient.                            All questions were answered, and informed consent                            was obtained. Prior Anticoagulants: The patient has                            taken no anticoagulant or antiplatelet agents. ASA                            Grade Assessment: II - A patient with mild systemic                            disease. After reviewing the risks and benefits,  the patient was deemed in satisfactory condition to                            undergo the procedure.                           After obtaining informed consent, the colonoscope                            was passed under direct vision. Throughout the                            procedure, the patient's blood pressure, pulse, and                            oxygen saturations were monitored continuously.  The                            Olympus [id number]) Colonoscope was                            introduced through the anus and advanced to the the                            cecum, identified by appendiceal orifice and                            ileocecal valve. The colonoscopy was performed                            without difficulty. The patient tolerated the                            procedure well. The quality of the bowel                            preparation was good. The ileocecal valve,                            appendiceal orifice, and rectum were photographed. Scope In: 1:33:18 PM Scope Out: 1:46:56 PM Scope Withdrawal Time: 0 hours 9 minutes 4 seconds  Total Procedure Duration: 0 hours 13 minutes 38 seconds  Findings:                 A posterior midline anal fissure was found on                            perianal exam.                           Three flat polyps were found in the cecum. The                            polyps were 1 to 3 mm in size. These polyps were  removed with a cold snare. Resection and retrieval                            were complete.                           A 3 mm polyp was found in the transverse colon. The                            polyp was sessile. The polyp was removed with a                            cold snare. Resection and retrieval were complete.                           A few small-mouthed diverticula were found in the                            sigmoid colon.                           The exam was otherwise without abnormality.                            Retroflexed views of rectum not obtained given                            small size of rectum. Complications:            No immediate complications. Estimated blood loss:                            Minimal. Estimated Blood Loss:     Estimated blood loss was minimal. Impression:               - Anal fissure found on perianal exam.                            - Three 1 to 3 mm polyps in the cecum, removed with                            a cold snare. Resected and retrieved.                           - One 3 mm polyp in the transverse colon, removed                            with a cold snare. Resected and retrieved.                           - Diverticulosis in the sigmoid colon.                           - The examination was otherwise normal. Recommendation:           -  Patient has a contact number available for                            emergencies. The signs and symptoms of potential                            delayed complications were discussed with the                            patient. Return to normal activities tomorrow.                            Written discharge instructions were provided to the                            patient.                           - Resume previous diet.                           - Continue present medications.                           - Topical diltiazem / lidocaine ointment for anal                            fissure - pea sized amount applied TID until healed                           - Await pathology results. Remo Lipps P. Osiris Charles, MD 07/19/2022 1:52:21 PM This report has been signed electronically.

## 2022-07-19 NOTE — Progress Notes (Signed)
Report given to PACU, vss 

## 2022-07-19 NOTE — Progress Notes (Signed)
[facility] History and Physical   Primary Care Physician:  Everardo Beals, NP   Reason for Procedure:   Family history of colon cancer, history of polyps  Plan:    colonoscopy     HPI: Jane Chavez is a 77 y.o. female  here for colonoscopy surveillance - sister had CRC, also niece had CRC at a very young age. Last exam 03/2017 with one adenoma.    Patient denies any bowel symptoms at this time. Otherwise feels well without any cardiopulmonary symptoms.   I have discussed risks / benefits of anesthesia and endoscopic procedure with [facility] and they wish to proceed with the exams as outlined today.    Past Medical History:  Diagnosis Date   Arthritis    RA   Back stiffness 03/13/2017   Pt.'s Dr. thinks it is Lupus related.   Cataract    removed both eyes   Chronic abdominal pain    Diverticulosis    Gallbladder polyp    GERD (gastroesophageal reflux disease)    History of colon polyps    HLD (hyperlipidemia)    Lipoma of stomach    Lupus ([place]) 2014   Pleural effusion 09/2015    Past Surgical History:  Procedure Laterality Date   BREAST EXCISIONAL BIOPSY Right    CATARACT EXTRACTION, BILATERAL     COLONOSCOPY     ESOPHAGOGASTRODUODENOSCOPY N/A 09/09/2017   Procedure: ESOPHAGOGASTRODUODENOSCOPY (EGD);  Surgeon: Yetta Flock, MD;  Location: [facility];  Service: Gastroenterology;  Laterality: N/A;   KYPHOPLASTY N/A 02/08/2021   Procedure: KYPHOPLASTY THORACIC 7, THORACIC 9, THORACIC 10;  Surgeon: Melina Schools, MD;  Location: [facility];  Service: Orthopedics;  Laterality: N/A;   REPLACEMENT TOTAL KNEE Right    TOTAL KNEE ARTHROPLASTY Left 04/24/2020   Procedure: TOTAL KNEE ARTHROPLASTY;  Surgeon: Gaynelle Arabian, MD;  Location: [facility];  Service: Orthopedics;  Laterality: Left;  43mn   UPPER GASTROINTESTINAL ENDOSCOPY      Prior to Admission medications   Medication Sig Start Date End Date Taking? Authorizing Provider   azaTHIOprine (IMURAN) 50 MG tablet Take 100 mg by mouth daily.  03/11/17  Yes [provider]  carboxymethylcellul-glycerin (REFRESH OPTIVE) 0.5-0.9 % ophthalmic solution Place 1 drop into both eyes 2 (two) times daily as needed for dry eyes.   Yes [provider]  furosemide (LASIX) 80 MG tablet Take 1 tablet (80 mg total) by mouth daily. Please have PCP manage going forward. 10/09/20  Yes Airika Alkhatib, SCarlota Raspberry MD  hydroxychloroquine (PLAQUENIL) 200 MG tablet Take 1 tablet (200 mg total) by mouth daily. Patient taking differently: Take 400 mg by mouth daily. 10/05/15  Yes GSamuella Cota MD  KLOR-CON M20 20 MEQ tablet TAKE 1 TABLET (20 MEQ TOTAL) BY MOUTH DAILY. PLEASE HAVE PCP MANAGE GOING FORWARD. 11/01/20  Yes Annissa Andreoni, SCarlota Raspberry MD  predniSONE (DELTASONE) 5 MG tablet Take 5 mg by mouth daily with breakfast.   Yes [provider]  famotidine (PEPCID) 20 MG tablet Take 1 tablet (20 mg total) by mouth daily as needed for indigestion or heartburn. Patient not taking: Reported on 06/11/2022 04/10/21   AYetta Flock MD  Potassium Chloride ER 20 MEQ TBCR Take 10 tablets by mouth daily.    [provider]  tiZANidine (ZANAFLEX) 4 MG tablet Take 1 tablet (4 mg total) by mouth every 6 (six) hours as needed for muscle spasms. 12/08/20   Long, JWonda Olds MD    Current Outpatient Medications  Medication Sig Dispense Refill   azaTHIOprine (IMURAN) 50 MG tablet Take 100 mg by mouth daily.   2   carboxymethylcellul-glycerin (REFRESH OPTIVE) 0.5-0.9 % ophthalmic solution Place 1 drop into both eyes 2 (two) times daily as needed for dry eyes.     furosemide (LASIX) 80 MG tablet Take 1 tablet (80 mg total) by mouth daily. Please have PCP manage going forward. 30 tablet 0   hydroxychloroquine (PLAQUENIL) 200 MG tablet Take 1 tablet (200 mg total) by mouth daily. (Patient taking differently: Take 400 mg by mouth daily.) 30 tablet 0   KLOR-CON M20 20 MEQ tablet TAKE 1  TABLET (20 MEQ TOTAL) BY MOUTH DAILY. PLEASE HAVE PCP MANAGE GOING FORWARD. 30 tablet 0   predniSONE (DELTASONE) 5 MG tablet Take 5 mg by mouth daily with breakfast.     famotidine (PEPCID) 20 MG tablet Take 1 tablet (20 mg total) by mouth daily as needed for indigestion or heartburn. (Patient not taking: Reported on 06/11/2022)     Potassium Chloride ER 20 MEQ TBCR Take 10 tablets by mouth daily.     tiZANidine (ZANAFLEX) 4 MG tablet Take 1 tablet (4 mg total) by mouth every 6 (six) hours as needed for muscle spasms. 20 tablet 0   Current Facility-Administered Medications  Medication Dose Route Frequency Provider Last Rate Last Admin   0.9 %  sodium chloride infusion  500 mL Intravenous Once Josue Falconi, Carlota Raspberry, MD        Allergies as of 07/19/2022 - Review Complete 07/19/2022  Allergen Reaction Noted   Penicillins Shortness Of Breath and Palpitations 12/03/2012    Family History  Problem Relation Age of Onset   Asthma Mother    Colon cancer Sister    Colon polyps Brother    Other Brother        colon surgery, growth removed   Bladder Cancer Brother    Breast cancer Daughter        double masectomy   Colon cancer Other        niece, deceased 09-08-2016   Esophageal cancer Neg Hx    Rectal cancer Neg Hx    Stomach cancer Neg Hx     Social History   Socioeconomic History   Marital status: Widowed    Spouse name: Not on file   Number of children: 6   Years of education: Not on file   Highest education level: Not on file  Occupational History   Occupation: retired   Tobacco Use   Smoking status: Never   Smokeless tobacco: Never  Vaping Use   Vaping Use: Never used  Substance and Sexual Activity   Alcohol use: No   Drug use: No   Sexual activity: Not Currently    Birth control/protection: Post-menopausal  Other Topics Concern   Not on file  Social History Narrative   Not on file   Social Determinants of Health   Financial Resource Strain: Not on file  Food  Insecurity: Not on file  Transportation Needs: Not on file  Physical Activity: Not on file  Stress: Not on file  Social Connections: Not on file  Intimate Partner Violence: Not on file    Review of Systems: All other review of systems negative except as mentioned in the HPI.  Physical Exam: Vital signs BP 135/77   Pulse 75   Temp (!) 95.9 F (35.5 C)   Ht 4' 9"$  (1.448 m)   Wt 155 lb (70.3 kg)   SpO2 96%  BMI 33.54 kg/m   General:   Alert,  Well-developed, pleasant and cooperative in NAD Lungs:  Clear throughout to auscultation.   Heart:  Regular rate and rhythm Abdomen:  Soft, nontender and nondistended.   Neuro/Psych:  Alert and cooperative. Normal mood and affect. A and O x 3  Jolly Mango, MD [facility]

## 2022-07-19 NOTE — Patient Instructions (Signed)
   Diltiazen 2%/Lidocaine 5% Rx given to you- this must be taken to [facility] & they will make up this Rx and let you know when it is ready    Handouts on polyps ,diverticulosis ,& hemorrhoids given to you today   Await pathology results on polyps removed     YOU HAD AN ENDOSCOPIC PROCEDURE TODAY AT [facility]:   Refer to the procedure report that was given to you for any specific questions about what was found during the examination.  If the procedure report does not answer your questions, please call your gastroenterologist to clarify.  If you requested that your care partner not be given the details of your procedure findings, then the procedure report has been included in a sealed envelope for you to review at your convenience later.  YOU SHOULD EXPECT: Some feelings of bloating in the abdomen. Passage of more gas than usual.  Walking can help get rid of the air that was put into your GI tract during the procedure and reduce the bloating. If you had a lower endoscopy (such as a colonoscopy or flexible sigmoidoscopy) you may notice spotting of blood in your stool or on the toilet paper. If you underwent a bowel prep for your procedure, you may not have a normal bowel movement for a few days.  Please Note:  You might notice some irritation and congestion in your nose or some drainage.  This is from the oxygen used during your procedure.  There is no need for concern and it should clear up in a day or so.  SYMPTOMS TO REPORT IMMEDIATELY:  Following lower endoscopy (colonoscopy or flexible sigmoidoscopy):  Excessive amounts of blood in the stool  Significant tenderness or worsening of abdominal pains  Swelling of the abdomen that is new, acute  Fever of 100F or higher   For urgent or emergent issues, a gastroenterologist can be reached at any hour by calling [phone]. Do not use MyChart messaging for urgent concerns.    DIET:  We do recommend a small  meal at first, but then you may proceed to your regular diet.  Drink plenty of fluids but you should avoid alcoholic beverages for 24 hours.  ACTIVITY:  You should plan to take it easy for the rest of today and you should NOT DRIVE or use heavy machinery until tomorrow (because of the sedation medicines used during the test).    FOLLOW UP: Our staff will call the number listed on your records the next business day following your procedure.  We will call around 7:15- 8:00 am to check on you and address any questions or concerns that you may have regarding the information given to you following your procedure. If we do not reach you, we will leave a message.     If any biopsies were taken you will be contacted by phone or by letter within the next 1-3 weeks.  Please call us at [phone] if you have not heard about the biopsies in 3 weeks.    SIGNATURES/CONFIDENTIALITY: You and/or your care partner have signed paperwork which will be entered into your electronic medical record.  These signatures attest to the fact that that the information above on your After Visit Summary has been reviewed and is understood.  Full responsibility of the confidentiality of this discharge information lies with you and/or your care-partner.

## 2022-07-22 ENCOUNTER — Telehealth: Payer: Self-pay

## 2022-07-22 NOTE — Telephone Encounter (Signed)
  Follow up Call-     07/19/2022    1:03 PM 12/18/2020    9:33 AM  Call back number  Post procedure Call Back phone  # [phone]  Permission to leave phone message Yes Yes     Patient questions:  Do you have a fever, pain , or abdominal swelling? No. Pain Score  0 *  Have you tolerated food without any problems? Yes.    Have you been able to return to your normal activities? Yes.    Do you have any questions about your discharge instructions: Diet   No. Medications  No. Follow up visit  No.  Do you have questions or concerns about your Care? No.  Actions: * If pain score is 4 or above: No action needed, pain <4.

## 2022-08-12 ENCOUNTER — Encounter: Payer: Self-pay | Admitting: Gastroenterology

## 2022-08-16 ENCOUNTER — Telehealth: Payer: Self-pay

## 2022-08-16 ENCOUNTER — Other Ambulatory Visit: Payer: Self-pay

## 2022-08-16 DIAGNOSIS — K219 Gastro-esophageal reflux disease without esophagitis: Secondary | ICD-10-CM

## 2022-08-16 DIAGNOSIS — Z800 Family history of malignant neoplasm of digestive organs: Secondary | ICD-10-CM

## 2022-08-16 DIAGNOSIS — K824 Cholesterolosis of gallbladder: Secondary | ICD-10-CM

## 2022-08-16 DIAGNOSIS — R1011 Right upper quadrant pain: Secondary | ICD-10-CM

## 2022-08-16 DIAGNOSIS — D175 Benign lipomatous neoplasm of intra-abdominal organs: Secondary | ICD-10-CM

## 2022-08-16 NOTE — Telephone Encounter (Signed)
Patient aware that she has been scheduled for an abdominal ultrasound at [facility] (1st floor of hospital) on 09-16-22 at [place]. Patient advised to arrive 15 minutes prior to appointment.  Patient advised to not to have anything to eat or drink 6 hours prior to  appointment. Patient agreed to plan and verbalized understanding.  No further questions.

## 2022-09-16 ENCOUNTER — Telehealth: Payer: Self-pay

## 2022-09-16 ENCOUNTER — Ambulatory Visit (HOSPITAL_COMMUNITY)
Admission: RE | Admit: 2022-09-16 | Discharge: 2022-09-16 | Disposition: A | Discharge status: Home / Self Care | Payer: Medicare Other | Source: Ambulatory Visit | Attending: Gastroenterology | Admitting: Gastroenterology

## 2022-09-16 DIAGNOSIS — Z800 Family history of malignant neoplasm of digestive organs: Secondary | ICD-10-CM | POA: Insufficient documentation

## 2022-09-16 DIAGNOSIS — D175 Benign lipomatous neoplasm of intra-abdominal organs: Secondary | ICD-10-CM | POA: Insufficient documentation

## 2022-09-16 DIAGNOSIS — K824 Cholesterolosis of gallbladder: Secondary | ICD-10-CM | POA: Insufficient documentation

## 2022-09-16 DIAGNOSIS — K219 Gastro-esophageal reflux disease without esophagitis: Secondary | ICD-10-CM | POA: Diagnosis present

## 2022-09-16 DIAGNOSIS — R1011 Right upper quadrant pain: Secondary | ICD-10-CM | POA: Diagnosis present

## 2022-09-16 NOTE — Telephone Encounter (Signed)
Patient has an appointment 09-16-22. Patient is aware

## 2022-09-16 NOTE — Telephone Encounter (Signed)
-----   Message from Cooper Render, CMA sent at 06/11/2022  5:16 PM EST ----- Regarding: due for RUQ U/S Patient due for RUQ U/S in mid April for gallbladder polyp

## 2022-09-17 ENCOUNTER — Telehealth: Payer: Self-pay

## 2022-09-17 NOTE — Telephone Encounter (Signed)
referral faxed to [facility]  - see U/S result note. Gallbladder polyp

## 2022-09-19 NOTE — Telephone Encounter (Signed)
Called and spoke to patient. She will check with her insurance provider and see who is covered by her plan and will call back and let us know so we can place the referral.

## 2022-09-19 NOTE — Telephone Encounter (Signed)
Patient called stated [facility] does not accept her insurance. Please advise.

## 2022-10-10 NOTE — Telephone Encounter (Signed)
Patient cancelled her 4-11 appointment.  She has not rescheduled

## 2022-10-14 ENCOUNTER — Ambulatory Visit
Admission: RE | Admit: 2022-10-14 | Discharge: 2022-10-14 | Disposition: A | Discharge status: Home / Self Care | Payer: Medicare Other | Source: Ambulatory Visit | Attending: *Deleted | Admitting: *Deleted

## 2022-10-14 ENCOUNTER — Other Ambulatory Visit: Payer: Self-pay | Admitting: *Deleted

## 2022-10-14 DIAGNOSIS — Z1231 Encounter for screening mammogram for malignant neoplasm of breast: Secondary | ICD-10-CM

## 2022-10-21 ENCOUNTER — Telehealth: Payer: Self-pay | Admitting: Gastroenterology

## 2022-10-21 NOTE — Telephone Encounter (Signed)
Patient called stating that Dr.Armbruster referred her to [facility] for gallbladder surgery but her insurance does not cover the procedure with [facility]. She requested to schedule a OV, so she is scheduled for the next available. She is requesting a call back to be advised further regarding referral. Please advise, thank you.

## 2022-10-21 NOTE — Telephone Encounter (Signed)
Spoke with patient, advised her of [place]'s recommendation below. States she would call Medicare to follow up.

## 2022-10-21 NOTE — Telephone Encounter (Signed)
Lm on vm for patient to return call.  Per previous telephone encounter - pt is supposed to check with her insurance to see what surgeon is in network and then we can place new referral.

## 2022-10-21 NOTE — Telephone Encounter (Signed)
Noted, thanks!

## 2022-11-13 ENCOUNTER — Telehealth: Payer: Self-pay

## 2022-11-13 ENCOUNTER — Other Ambulatory Visit: Payer: Self-pay | Admitting: *Deleted

## 2022-11-13 DIAGNOSIS — D175 Benign lipomatous neoplasm of intra-abdominal organs: Secondary | ICD-10-CM

## 2022-11-13 NOTE — Telephone Encounter (Signed)
-----   Message from Missy Sabins, RN sent at 06/14/2022 12:31 PM EST ----- Regarding: CT appt CT scan - gastric lipoma

## 2022-11-13 NOTE — Telephone Encounter (Signed)
Pt notified that Dr. Adela Lank wants her to have a CT ABD/Pelvis W WO contrast. Pt states she is not sure if her insurance will pay for the procedure. Suggested to the that she could call her insurance to see if they wil pay. Pt states she will back.

## 2022-11-26 ENCOUNTER — Ambulatory Visit (HOSPITAL_COMMUNITY)
Admission: RE | Admit: 2022-11-26 | Discharge: 2022-11-26 | Disposition: A | Discharge status: Home / Self Care | Payer: Medicare Other | Source: Ambulatory Visit | Attending: Gastroenterology | Admitting: Gastroenterology

## 2022-11-26 DIAGNOSIS — D175 Benign lipomatous neoplasm of intra-abdominal organs: Secondary | ICD-10-CM | POA: Diagnosis present

## 2022-11-26 MED ORDER — IOHEXOL 300 MG/ML  SOLN
75.0000 mL | Freq: Once | INTRAMUSCULAR | Status: AC | PRN
  Administered 2022-11-26: 100 mL via INTRAVENOUS

## 2022-12-11 ENCOUNTER — Encounter (INDEPENDENT_AMBULATORY_CARE_PROVIDER_SITE_OTHER): Payer: Self-pay | Admitting: Ophthalmology

## 2022-12-11 ENCOUNTER — Ambulatory Visit (INDEPENDENT_AMBULATORY_CARE_PROVIDER_SITE_OTHER): Payer: Medicare Other | Admitting: Ophthalmology

## 2022-12-11 DIAGNOSIS — H31003 Unspecified chorioretinal scars, bilateral: Secondary | ICD-10-CM

## 2022-12-11 DIAGNOSIS — M329 Systemic lupus erythematosus, unspecified: Secondary | ICD-10-CM | POA: Diagnosis not present

## 2022-12-11 DIAGNOSIS — H35369 Drusen (degenerative) of macula, unspecified eye: Secondary | ICD-10-CM | POA: Diagnosis not present

## 2022-12-11 DIAGNOSIS — Z79899 Other long term (current) drug therapy: Secondary | ICD-10-CM

## 2022-12-11 DIAGNOSIS — Z961 Presence of intraocular lens: Secondary | ICD-10-CM

## 2022-12-11 NOTE — Progress Notes (Addendum)
[facility] - Clinic Note  12/11/2022   CHIEF COMPLAINT Patient presents for Retina Evaluation  HISTORY OF PRESENT ILLNESS: Jane Chavez is a 77 y.o. female who presents to the clinic today for:  HPI     Retina Evaluation   In both eyes.  This started 2 weeks ago.  Duration of 2 weeks.  Associated Symptoms Floaters.  Context:  distance vision, mid-range vision and near vision.  I, the attending physician,  performed the HPI with the patient and updated documentation appropriately.        Comments   Retina eval per Dr Zenaida Niece for abnormal macular finding pt is reporting the last few months her vision has not been as sharp in her right eye she had a laser and reports it got worse after having it she has noticed some floaters but denies flashes pt has lupus       Last edited by Rennis Chris, MD on 12/11/2022  9:01 AM.    Pt is here on the referral of Dr. Zenaida Niece for Plaquenil exam, pt is on Plaquenil for Lupus, pt feels like her vision has decreased, she states she is seeing floaters, at night she gets glare from other cars, pt states she had cataract sx with Dr. Elmer Picker about 4 years ago, she has also had a laser procedure in the right eye that she feels like decreased her vision, pt states she was very near sighted before her cataract sx    Referring physician: Diona Foley, MD [id number] [place],  [place] [address]  HISTORICAL INFORMATION:  Selected notes from the MEDICAL RECORD NUMBER Referred by Dr. Zenaida Niece for retina eval LEE:  Ocular Hx- PMH- SLE on plaquenil (200mg  daily)   CURRENT MEDICATIONS: Current Outpatient Medications (Ophthalmic Drugs)  Medication Sig   carboxymethylcellul-glycerin (REFRESH OPTIVE) 0.5-0.9 % ophthalmic solution Place 1 drop into both eyes 2 (two) times daily as needed for dry eyes.   No current facility-administered medications for this visit. (Ophthalmic Drugs)   Current Outpatient Medications (Other)  Medication Sig    AMBULATORY NON FORMULARY MEDICATION Medication Name: Topical diltiazem 2 % and Lidocaine 5% for anal fissure- pea sized amount applied TID until healed   azaTHIOprine (IMURAN) 50 MG tablet Take 100 mg by mouth daily.    famotidine (PEPCID) 20 MG tablet Take 1 tablet (20 mg total) by mouth daily as needed for indigestion or heartburn. (Patient not taking: Reported on 06/11/2022)   furosemide (LASIX) 80 MG tablet Take 1 tablet (80 mg total) by mouth daily. Please have PCP manage going forward.   hydroxychloroquine (PLAQUENIL) 200 MG tablet Take 1 tablet (200 mg total) by mouth daily. (Patient taking differently: Take 400 mg by mouth daily.)   KLOR-CON M20 20 MEQ tablet TAKE 1 TABLET (20 MEQ TOTAL) BY MOUTH DAILY. PLEASE HAVE PCP MANAGE GOING FORWARD.   Potassium Chloride ER 20 MEQ TBCR Take 10 tablets by mouth daily.   predniSONE (DELTASONE) 5 MG tablet Take 5 mg by mouth daily with breakfast.   tiZANidine (ZANAFLEX) 4 MG tablet Take 1 tablet (4 mg total) by mouth every 6 (six) hours as needed for muscle spasms.   No current facility-administered medications for this visit. (Other)   REVIEW OF SYSTEMS: ROS   Positive for: Musculoskeletal, Eyes Last edited by Etheleen Mayhew, COT on 12/11/2022  8:39 AM.     ALLERGIES Allergies  Allergen Reactions   Penicillins Shortness Of Breath and Palpitations    Has  patient had a PCN reaction causing immediate rash, facial/tongue/throat swelling, SOB or lightheadedness with hypotension: Yes Has patient had a PCN reaction causing severe rash involving mucus membranes or skin necrosis: No Has patient had a PCN reaction that required hospitalization No Has patient had a PCN reaction occurring within the last 10 years: No If all of the above answers are "NO", then may proceed with Cephalosporin use.    PAST MEDICAL HISTORY Past Medical History:  Diagnosis Date   Arthritis    RA   Back stiffness 03/13/2017   Pt.'s Dr. thinks it is Lupus related.    Cataract    removed both eyes   Chronic abdominal pain    Diverticulosis    Gallbladder polyp    GERD (gastroesophageal reflux disease)    History of colon polyps    HLD (hyperlipidemia)    Lipoma of stomach    Lupus (HCC) 2014   Pleural effusion 09/2015   Past Surgical History:  Procedure Laterality Date   BREAST EXCISIONAL BIOPSY Right    CATARACT EXTRACTION, BILATERAL     COLONOSCOPY     ESOPHAGOGASTRODUODENOSCOPY N/A 09/09/2017   Procedure: ESOPHAGOGASTRODUODENOSCOPY (EGD);  Surgeon: Benancio Deeds, MD;  Location: [facility];  Service: Gastroenterology;  Laterality: N/A;   KYPHOPLASTY N/A 02/08/2021   Procedure: KYPHOPLASTY THORACIC 7, THORACIC 9, THORACIC 10;  Surgeon: Venita Lick, MD;  Location: [facility];  Service: Orthopedics;  Laterality: N/A;   REPLACEMENT TOTAL KNEE Right    TOTAL KNEE ARTHROPLASTY Left 04/24/2020   Procedure: TOTAL KNEE ARTHROPLASTY;  Surgeon: Ollen Gross, MD;  Location: [facility];  Service: Orthopedics;  Laterality: Left;    UPPER GASTROINTESTINAL ENDOSCOPY     FAMILY HISTORY Family History  Problem Relation Age of Onset   Asthma Mother    Colon cancer Sister    Colon polyps Brother    Other Brother        colon surgery, growth removed   Bladder Cancer Brother    Breast cancer Daughter        double masectomy   Colon cancer Other        niece, deceased 09-07-2016  Esophageal cancer Neg Hx    Rectal cancer Neg Hx    Stomach cancer Neg Hx    SOCIAL HISTORY Social History   Tobacco Use   Smoking status: Never   Smokeless tobacco: Never  Vaping Use   Vaping Use: Never used  Substance Use Topics   Alcohol use: No   Drug use: No       OPHTHALMIC EXAM:  Base Eye Exam     Visual Acuity (Snellen - Linear)       Right Left   Dist [place] 20/70 20/40 -1   Dist ph [place] 20/40 -1 20/30 -1         Tonometry (Tonopen, 8:51 AM)       Right Left   Pressure 12 12         Pupils       Pupils Dark Light Shape React APD    Right PERRL 3 2 Round Brisk None   Left PERRL 3 2 Round Brisk None         Visual Fields       Left Right    Full Full         Extraocular Movement       Right Left    Full, Ortho Full, Ortho  Neuro/Psych     Oriented x3: Yes         Dilation     Both eyes:            Slit Lamp and Fundus Exam     External Exam       Right Left   External Normal Normal         Slit Lamp Exam       Right Left   Lids/Lashes Dermatochalasis - upper lid Dermatochalasis - upper lid   Conjunctiva/Sclera White and quiet White and quiet   Cornea arcus, well healed cataract wound, EBMD arcus, well healed cataract wound, EBMD   Anterior Chamber deep and clear deep and clear   Iris Round and dilated Round and dilated   Lens PC IOL in good position with open PC PC IOL in good position   Anterior Vitreous mild syneresis, Posterior vitreous detachment mild syneresis         Fundus Exam       Right Left   Disc Pink and Sharp, Compact, mild PPP Trace Pallor, Sharp rim, mild PPA, mild elevation   C/D Ratio 0.1 0.1   Macula Flat, Good foveal reflex, RPE mottling, No heme or edema Flat, Good foveal reflex, RPE mottling, No heme or edema   Vessels mild attenuation, mild tortuosity attenuated, mild tortuosity   Periphery Attached, focal midzonal drusen, RPE mottling and clumping at 1130 outside of ST arcades, ?amelanotic nevus, focal pigmented CR atrophy at 0500 Attached, focal midzonal drusen, RPE mottling superior to disc, mild paving stone degeneration           Refraction     Manifest Refraction       Sphere Cylinder Axis Dist VA   Right -1.00 Sphere  20/40   Left -1.50 +0.50 180 20/40           IMAGING AND PROCEDURES  Imaging and Procedures for 12/11/2022  OCT, Retina - OU - Both Eyes       Right Eye Quality was good. Central Foveal Thickness: 287. Progression has no prior data. Findings include normal foveal contour, no IRF, no SRF, myopic contour  (Diffusely decreased ellipsoid signal -- greatest non-centrally).   Left Eye Quality was poor. Central Foveal Thickness: 289. Progression has no prior data. Findings include normal foveal contour, no IRF, no SRF, myopic contour (Diffusely decreased ellipsoid signal -- greatest non-centrally).   Notes *Images captured and stored on drive  Diagnosis / Impression:  NFP; no IRF/SRF OU Myopic contour OU Diffusely decreased ellipsoid signal -- greatest non-centrally OU   Clinical management:  See below  Abbreviations: NFP - Normal foveal profile. CME - cystoid macular edema. PED - pigment epithelial detachment. IRF - intraretinal fluid. SRF - subretinal fluid. EZ - ellipsoid zone. ERM - epiretinal membrane. ORA - outer retinal atrophy. ORT - outer retinal tubulation. SRHM - subretinal hyper-reflective material. IRHM - intraretinal hyper-reflective material           ASSESSMENT/PLAN:   ICD-10-CM   1. Scarring, chorioretinal, bilateral  H31.003     2. Drusen  H35.369     3. Long-term use of Plaquenil  Z79.899 OCT, Retina - OU - Both Eyes    4. Systemic lupus erythematosus, unspecified SLE type, unspecified organ involvement status (HCC)  M32.9     5. Pseudophakia, both eyes  Z96.1      1,2. Midzonal CR scarring and drusen OU  - focal midzonal CR scarring and drusen superior to disc OU  -  monitor  - f/u in 6-9 mos -- DFE/OCT  3,4. Plaquenil (hydroxychloroquine [HCQ]) use for Lupus  - monitored by Dr. Zenaida Niece - no retinal toxicity noted on exam or OCT today - normal OCT OU - pt taking 200 mg daily for >10 years - pt reports wt is ~70 kg - 200/70 = 2.86 mg/kg/day - the AAO recommends daily dosing of < 5.0 mg/kg for HCQ -- below recommended threshold - f/u 6-9 months, DFE, OCT  5. Pseudophakia OU  - s/p CE/IOL (Dr. Elmer Picker, ~2020)  - IOL in good position, doing well  - monitor  Ophthalmic Meds Ordered this visit:  No orders of the defined types were placed in this  encounter.    Return for f/u 6-9 months, midzonal CR scarring and drusen OU, DFE, OCT.  There are no Patient Instructions on file for this visit.  Explained the diagnoses, plan, and follow up with the patient and they expressed understanding.  Patient expressed understanding of the importance of proper follow up care.   This document serves as a record of services personally performed by Karie Chimera, MD, PhD. It was created on their behalf by Glee Arvin. Manson Passey, OA an ophthalmic technician. The creation of this record is the provider's dictation and/or activities during the visit.    Electronically signed by: Glee Arvin. Manson Passey, OA 12/11/22 6:31 PM  Karie Chimera, M.D., Ph.D. Diseases & Surgery of the Retina and Vitreous [facility] 12/11/2022  I have reviewed the above documentation for accuracy and completeness, and I agree with the above. Karie Chimera, M.D., Ph.D. 12/11/22 6:31 PM   Abbreviations: M myopia (nearsighted); A astigmatism; H hyperopia (farsighted); P presbyopia; Mrx spectacle prescription;  CTL contact lenses; OD right eye; OS left eye; OU both eyes  XT exotropia; ET esotropia; PEK punctate epithelial keratitis; PEE punctate epithelial erosions; DES dry eye syndrome; MGD meibomian gland dysfunction; ATs artificial tears; PFAT's preservative free artificial tears; NSC nuclear sclerotic cataract; PSC posterior subcapsular cataract; ERM epi-retinal membrane; PVD posterior vitreous detachment; RD retinal detachment; DM diabetes mellitus; DR diabetic retinopathy; NPDR non-proliferative diabetic retinopathy; PDR proliferative diabetic retinopathy; CSME clinically significant macular edema; DME diabetic macular edema; dbh dot blot hemorrhages; CWS cotton wool spot; POAG primary open angle glaucoma; C/D cup-to-disc ratio; HVF humphrey visual field; GVF goldmann visual field; OCT optical coherence tomography; IOP intraocular pressure; BRVO Branch retinal vein  occlusion; CRVO central retinal vein occlusion; CRAO central retinal artery occlusion; BRAO branch retinal artery occlusion; RT retinal tear; SB scleral buckle; PPV pars plana vitrectomy; VH Vitreous hemorrhage; PRP panretinal laser photocoagulation; IVK intravitreal kenalog; VMT vitreomacular traction; MH Macular hole;  NVD neovascularization of the disc; NVE neovascularization elsewhere; AREDS age related eye disease study; ARMD age related macular degeneration; POAG primary open angle glaucoma; EBMD epithelial/anterior basement membrane dystrophy; ACIOL anterior chamber intraocular lens; IOL intraocular lens; PCIOL posterior chamber intraocular lens; Phaco/IOL phacoemulsification with intraocular lens placement; PRK photorefractive keratectomy; LASIK laser assisted in situ keratomileusis; HTN hypertension; DM diabetes mellitus; COPD chronic obstructive pulmonary disease

## 2022-12-16 ENCOUNTER — Encounter (INDEPENDENT_AMBULATORY_CARE_PROVIDER_SITE_OTHER): Payer: Medicare Other | Admitting: Ophthalmology

## 2023-01-23 ENCOUNTER — Ambulatory Visit (INDEPENDENT_AMBULATORY_CARE_PROVIDER_SITE_OTHER): Payer: Medicare Other | Admitting: Gastroenterology

## 2023-01-23 ENCOUNTER — Telehealth: Payer: Self-pay

## 2023-01-23 ENCOUNTER — Encounter: Payer: Self-pay | Admitting: Gastroenterology

## 2023-01-23 VITALS — BP 122/80 | HR 70 | Ht <= 58 in | Wt 155.0 lb

## 2023-01-23 DIAGNOSIS — D175 Benign lipomatous neoplasm of intra-abdominal organs: Secondary | ICD-10-CM | POA: Diagnosis not present

## 2023-01-23 DIAGNOSIS — Z86010 Personal history of colonic polyps: Secondary | ICD-10-CM

## 2023-01-23 DIAGNOSIS — R1011 Right upper quadrant pain: Secondary | ICD-10-CM | POA: Diagnosis not present

## 2023-01-23 DIAGNOSIS — K824 Cholesterolosis of gallbladder: Secondary | ICD-10-CM

## 2023-01-23 NOTE — Telephone Encounter (Signed)
Referral faxed to [facility]) at [phone], phone: [phone]

## 2023-01-23 NOTE — Progress Notes (Signed)
HPI :  77 year old female here for a follow-up visit for abdominal pain in the right upper quadrant, gallbladder polyp, history of large gastric lipoma.   Recall she has had prior ultrasounds of her right upper quadrant showing no gallstones but, small gallbladder polyp.  CT scan for this in 2018 showing a gastric lipoma.  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  Over time she had another CT scan 10/2020 and endoscopy 12/2020 as outlined below.  CT scan showed some interval enlargement of the gastric lipoma to 4 cm and endoscopy confirmed the lesion appears slightly bigger.  She had a subsequent CT renal stone study in July 2022 which showed stable appearance of the gastric lipoma.  More recently given her symptoms we performed a follow-up CT scan in July 2023 which did show some mildly increased in size of the gastric lipoma as outlined.  Over the past year she has had some follow-up imaging for both of these issues. An interval right upper quadrant ultrasound to reassess the gallbladder polyp was performed in April.  This showed the gallbladder polyp increased from 4 mm in size to 7 mm in size.  Given her symptoms and increasing size of the gallbladder polyp I had recommended referral to see general surgery.  She was referred to [facility] general surgery to discuss cholecystectomy however they did not accept her insurance and she has not been able to be seen by the surgeon.  She also had a follow-up CT scan of her abdomen pelvis in June this showed the lipoma to be 4.6 cm in largest diameter, slightly larger than the last time it was imaged.  Hepatic steatosis noted.  I discussed her case with Dr. Meridee Score of advanced endoscopy in regards to endoscopic unroofing of this.  He does not specifically do this but we had discussed referral to [facility] with the patient about potential unroofing of this.  Patient declined at the time and wanted to monitor.  She states  she continues to have intermittent pains that bother her.  She will continue to have occasional pain in the right upper quadrant, she rates it at 7-8 out of 10, can last for a few hours at a time and then resolves on its own.  She feels this about 2-3 times per week.  She states they can sometimes occur sporadically however she does have some worsening postprandially when she eats a fatty or greasy meal.  She is really try to change her diet to eat less greasy and fatty foods and she thinks that has helped.  She continues to be bothered by this.  No nausea or vomiting.   Recall her sister had colon cancer. Her niece had colon cancer at a very young age in her 20s.  She had a small adenoma removed in October 2018, she has been having surveillance colonoscopy every 5 years.  We performed her last colonoscopy in February 2024, few small cecal sessile serrated polyps.  Nothing high risk or concerning.   Prior workup: Colonoscopy 04/03/2017 - diverticulosis, 2 small polyps removed - one adenoma, recall 5 years for strong family history of colon cancer US abdomen 12/06/2014 - normal gallbladder, no gallstones CT scan 06/06/2017 - suspected gastric lipoma, otherwise unremarkable exam EGD 09/09/2017 - 3cm HH, large lipoma of the antrum, biopsies negative for HP    CT scan 10/17/20 - IMPRESSION: 1. No acute abnormality in the abdomen or pelvis. 2. Increased size  of the 4.1 cm fat density lesion projecting into the gastric antrum, favored to represent a gastric lipoma. 3. Mild diffuse hepatic steatosis. 4. Moderate volume of formed stool throughout the colon. 5. Pelvic floor laxity with small cystocele. 6. Aortic atherosclerosis.     EGD 12/18/20 -  - A 1 cm hiatal hernia was present. - Mild esophagitis was found 31 cm from the incisors. - The exam of the esophagus was otherwise normal. - There was a large lipoma in the gastric antrum, in close approximation to the pylorus but was not obstructing it. It  appeared larger than the last time it was evaluated in 09/2017. Bite on bite biopsies were taken with a cold forceps for histology. - The exam of the stomach was otherwise normal. - Biopsies were taken with a cold forceps in the gastric body, at the incisura and in the gastric antrum for Helicobacter pylori testing. - The duodenal bulb and second portion of the duodenum were normal.     1. Surgical [P], gastric antrum and gastric body - ANTRAL AND OXYNTIC MUCOSA WITH HYPEREMIA. - NO LIPOMATOUS TISSUE, INTESTINAL METAPLASIA, DYSPLASIA, OR CARCINOMA. [place] NEGATIVE FOR HELICOBACTER PYLORI. 2. Surgical [P], gastric antrum and gastric body - ANTRAL AND OXYNTIC MUCOSA WITH SLIGHT HYPEREMIA. - [place] NEGATIVE FOR HELICOBACTER PYLORI. - NO INTESTINAL METAPLASIA, DYSPLASIA, OR CARCINOMA.     CT renal stone study 12/08/20 - IMPRESSION: 1. No acute or active process within the abdomen or pelvis. 2. Stable gastric lipoma. 3. Simple right renal cyst.     RUQ [place] 10/01/21: IMPRESSION: 1. The 4 mm polyp in the gallbladder is stable. Recommend a follow-up ultrasound in 1 year to ensure stability. 2. No other abnormalities are identified.     CT abdomen / pelvis with contrast 12/12/21: IMPRESSION: 1. No acute findings within the abdomen or pelvis. 2. Uniformly fat attenuating mass within the gastric antrum is again noted compatible with gastric lipoma. Mildly increased in size from previous exam as detailed above. 3. Small cystocele. 4. Treated compression deformities involving T10 and T11. Mild inferior endplate compression deformities are identified involving the L2 and L4 vertebral bodies which appear new compared with previous exam on 12/08/2020. Loss of approximately 15% of the vertebral body height is noted at these levels. 5.  Aortic Atherosclerosis (ICD10-I70.0).      Colonoscopy 07/19/2022: - Anal fissure found on perianal exam. - Three 1 to 3 mm polyps in the cecum,  removed with a cold snare. Resected and retrieved. - One 3 mm polyp in the transverse colon, removed with a cold snare. Resected and retrieved. - Diverticulosis in the sigmoid colon. - The examination was otherwise normal.  1. Surgical [P], colon, transverse, polyp (1) POLYPOID COLONIC MUCOSA. MULTIPLE ADDITIONAL LEVELS EXAMINED. 2. Surgical [P], colon, cecum, polyp (3) SESSILE SERRATED ADENOMA (S) WITHOUT CYTOLOGIC DYSPLASIA.  RUQ [place] 09/16/22: IMPRESSION: 1. Gallbladder polyp appears mildly increased in size now measuring up to 7 mm. Recommend follow-up right upper quadrant ultrasound in 6 months.   CT abdomen / pelvis 11/28/22: IMPRESSION: 1. Redemonstration of an approximately 2.8 x 4.6 cm oval macroscopic fat attenuation structure in the pylorus of the stomach, compatible with provided history of gastric lipoma. 2. Hepatic steatosis. 3. Aortic atherosclerosis.  Past Medical History:  Diagnosis Date   Arthritis    RA   Back stiffness 03/13/2017   Pt.'s Dr. thinks it is Lupus related.   Cataract    removed both eyes   Chronic abdominal pain  Diverticulosis    Gallbladder polyp    GERD (gastroesophageal reflux disease)    History of colon polyps    HLD (hyperlipidemia)    Lipoma of stomach    Lupus (HCC) 2014   Pleural effusion 09/2015     Past Surgical History:  Procedure Laterality Date   BREAST EXCISIONAL BIOPSY Right    CATARACT EXTRACTION, BILATERAL     COLONOSCOPY     ESOPHAGOGASTRODUODENOSCOPY N/A 09/09/2017   Procedure: ESOPHAGOGASTRODUODENOSCOPY (EGD);  Surgeon: Benancio Deeds, MD;  Location: [facility];  Service: Gastroenterology;  Laterality: N/A;   KYPHOPLASTY N/A 02/08/2021   Procedure: KYPHOPLASTY THORACIC 7, THORACIC 9, THORACIC 10;  Surgeon: Venita Lick, MD;  Location: [facility];  Service: Orthopedics;  Laterality: N/A;   REPLACEMENT TOTAL KNEE Right    TOTAL KNEE ARTHROPLASTY Left 04/24/2020   Procedure: TOTAL KNEE ARTHROPLASTY;  Surgeon:  Ollen Gross, MD;  Location: [facility];  Service: Orthopedics;  Laterality: Left;    UPPER GASTROINTESTINAL ENDOSCOPY     Family History  Problem Relation Age of Onset   Asthma Mother    Colon cancer Sister    Colon polyps Brother    Other Brother        colon surgery, growth removed   Bladder Cancer Brother    Breast cancer Daughter        double masectomy   Colon cancer Other        niece, deceased Sep 22, 2016  Esophageal cancer Neg Hx    Rectal cancer Neg Hx    Stomach cancer Neg Hx    Social History   Tobacco Use   Smoking status: Never   Smokeless tobacco: Never  Vaping Use   Vaping status: Never Used  Substance Use Topics   Alcohol use: No   Drug use: No   Current Outpatient Medications  Medication Sig Dispense Refill   AMBULATORY NON FORMULARY MEDICATION Medication Name: Topical diltiazem 2 % and Lidocaine 5% for anal fissure- pea sized amount applied TID until healed 30 g 0   azaTHIOprine (IMURAN) 50 MG tablet Take 100 mg by mouth daily.   2   carboxymethylcellul-glycerin (REFRESH OPTIVE) 0.5-0.9 % ophthalmic solution Place 1 drop into both eyes 2 (two) times daily as needed for dry eyes.     famotidine (PEPCID) 20 MG tablet Take 1 tablet (20 mg total) by mouth daily as needed for indigestion or heartburn.     furosemide (LASIX) 80 MG tablet Take 1 tablet (80 mg total) by mouth daily. Please have PCP manage going forward. (Patient taking differently: Take 80 mg by mouth daily. Please have PCP manage going forward.pt taking 40 mg a day) 30 tablet 0   hydroxychloroquine (PLAQUENIL) 200 MG tablet Take 1 tablet (200 mg total) by mouth daily. (Patient taking differently: Take 400 mg by mouth daily.) 30 tablet 0   KLOR-CON M20 20 MEQ tablet TAKE 1 TABLET (20 MEQ TOTAL) BY MOUTH DAILY. PLEASE HAVE PCP MANAGE GOING FORWARD. 30 tablet 0   Potassium Chloride ER 20 MEQ TBCR Take 10 tablets by mouth daily.     predniSONE (DELTASONE) 5 MG tablet Take 5 mg by mouth daily with  breakfast.     tiZANidine (ZANAFLEX) 4 MG tablet Take 1 tablet (4 mg total) by mouth every 6 (six) hours as needed for muscle spasms. 20 tablet 0   No current facility-administered medications for this visit.   Allergies  Allergen Reactions   Penicillins Shortness Of Breath and Palpitations  Has patient had a PCN reaction causing immediate rash, facial/tongue/throat swelling, SOB or lightheadedness with hypotension: Yes Has patient had a PCN reaction causing severe rash involving mucus membranes or skin necrosis: No Has patient had a PCN reaction that required hospitalization No Has patient had a PCN reaction occurring within the last 10 years: No If all of the above answers are "NO", then may proceed with Cephalosporin use.      Review of Systems: All systems reviewed and negative except where noted in HPI.   Lab Results  Component Value Date   ALT 17 12/08/2020   AST 22 12/08/2020   ALKPHOS 66 12/08/2020   BILITOT 0.8 12/08/2020    Lab Results  Component Value Date   NA 143 01/30/2021   CL 108 01/30/2021   K 3.1 (L) 01/30/2021   CO2 28 01/30/2021   BUN 9 01/30/2021   CREATININE 0.80 12/12/2021   GFRNONAA >60 01/30/2021   CALCIUM 9.3 01/30/2021   ALBUMIN 3.7 12/08/2020   GLUCOSE 84 01/30/2021    Lab Results  Component Value Date   ALT 17 12/08/2020   AST 22 12/08/2020   ALKPHOS 66 12/08/2020   BILITOT 0.8 12/08/2020    Physical Exam: BP 122/80   Pulse 70   Ht 4\' 9"  (1.448 m)   Wt 155 lb (70.3 kg)   BMI 33.54 kg/m  Constitutional: Pleasant,well-developed, female in no acute distress. Neurological: Alert and oriented to person place and time. Psychiatric: Normal mood and affect. Behavior is normal.   ASSESSMENT: 77 y.o. female here for assessment of the following  1. RUQ pain   2. Gallbladder polyp   3. Lipoma of stomach   4. History of colon polyps    As above, she continues to have intermittent right upper quadrant pain.  Not classic for  biliary colic as she does have this without any perennial relationship, however she does have some reproduction with eating heavier foods.  Historically with prior right upper quadrant ultrasound showed no gallstones and a very small gallbladder polyp.  Recent surveillance ultrasound has showed interval growth of the gallbladder polyp.  Recall she also has a large lipoma in her stomach, this has slightly increased in size over time.    The question over time has been what is causing her pain and if this large lipoma of her stomach was related.  Generally lipomas do not cause symptoms but they can at larger sizes.  Surgical resection of this would seem rather aggressive, we discussed option of unroofing this endoscopically with ESD, that would require tertiary care referral.  I am not convinced this lipoma is causing any symptoms and if intervention on this would even benefit her in any way.  Given the interval increase in size of the gallbladder polyp and some of her prandial relationship to her symptoms recently, I think reasonable to have her see general surgery to discuss cholecystectomy.  As above she was referred back in April but the local surgical group in town does not accept her insurance.  We discussed this again and what cholecystectomy would entail.  She is agreeable to see surgery, I will refer her to [facility] clinic to discuss cholecystectomy.  If they plan on removing her gallbladder we will hold off on further surveillance ultrasound.  If she does not have her gallbladder removed, then she will need a follow-up surveillance ultrasound to reassess the gallbladder polyp.  If she has her gallbladder removed we will await her course  and see if this resolves her symptoms.  If not and her symptoms persist, then it remains possible that the lipoma potentially could be related.  We will await her course.  Otherwise colonoscopy recently done with no high risk lesions, she  would not be due for another 5 years for exam, at that time she will be 77 years old, an age at which routine surveillance is not routinely performed but we can discuss at that time how she wants to proceed.  PLAN: - refer to general surgery to discuss cholecystectomy - if having surgery holding off on surveillance RUQ [place]. If she does not have surgery she will let us know and we will schedule surveillance [place] - discussed likelyhood of lipoma causing symptoms, I think less likely. Holding off on referral for endoscopic unroofing. May consider interval imaging over time given slight enlargement in size on the last exam - likely no further colonoscopies given no high risk lesions on her last exam  Harlin Rain, MD [facility]

## 2023-01-23 NOTE — Patient Instructions (Addendum)
We are referring you to General Surgery - [facility] to discuss Cholecystectomy.  They will contact you directly to schedule an appointment.  It may take a week or more before you hear from them.  Please feel free to contact us if you have not heard from them within 2 weeks and we will follow up on the referral.   Thank you for entrusting me with your care and for choosing [facility], Dr. Ileene Patrick    If your blood pressure at your visit was 140/90 or greater, please contact your primary care physician to follow up on this. ______________________________________________________  If you are age 55 or older, your body mass index should be between 23-30. Your Body mass index is 33.54 kg/m. If this is out of the aforementioned range listed, please consider follow up with your Primary Care Provider.  If you are age 67 or younger, your body mass index should be between 19-25. Your Body mass index is 33.54 kg/m. If this is out of the aformentioned range listed, please consider follow up with your Primary Care Provider.  ________________________________________________________  The [place] [facility] providers would like to encourage you to use [facility] to communicate with providers for non-urgent requests or questions.  Due to long hold times on the telephone, sending your provider a message by [facility] may be a faster and more efficient way to get a response.  Please allow 48 business hours for a response.  Please remember that this is for non-urgent requests.  _______________________________________________________  Due to recent changes in healthcare laws, you may see the results of your imaging and laboratory studies on MyChart before your provider has had a chance to review them.  We understand that in some cases there may be results that are confusing or concerning to you. Not all laboratory results come back in the same time frame and the provider may be waiting for multiple  results in order to interpret others.  Please give [place] 48 hours in order for your provider to thoroughly review all the results before contacting the office for clarification of your results.

## 2023-01-30 NOTE — Telephone Encounter (Signed)
Called to check status.When they called the patient, she indicated she wanted to be seen at the [facility] location.However,  there isn't a [place] location for [facility].  They will let patient know that and see if she would like to be seen in [facility]

## 2023-02-13 ENCOUNTER — Other Ambulatory Visit (HOSPITAL_COMMUNITY): Payer: Self-pay | Admitting: Surgery

## 2023-02-13 DIAGNOSIS — R1011 Right upper quadrant pain: Secondary | ICD-10-CM

## 2023-02-24 ENCOUNTER — Encounter (HOSPITAL_COMMUNITY)
Admission: RE | Admit: 2023-02-24 | Discharge: 2023-02-24 | Disposition: A | Discharge status: Home / Self Care | Payer: Medicare Other | Source: Ambulatory Visit | Attending: Surgery | Admitting: Surgery

## 2023-02-24 DIAGNOSIS — R1011 Right upper quadrant pain: Secondary | ICD-10-CM | POA: Insufficient documentation

## 2023-02-24 MED ORDER — TECHNETIUM TC 99M MEBROFENIN IV KIT
5.1800 | PACK | Freq: Once | INTRAVENOUS | Status: AC
  Administered 2023-02-24: 5.18 via INTRAVENOUS

## 2023-04-17 NOTE — Progress Notes (Signed)
Subjective:     Patient ID: Lahoma Crocker, female   DOB: March 29, 1946, 77 y.o.   MRN: [id number]  HPI  Chief Complaint  Patient presents with   Follow-up    Referring provider: Georgianne Fick, MD  HPI: 77 year old female never smoker with lupus seen for pulmonary consult during hospitalization 2017 for bilateral effusions. Patient has grade 2 diastolic congestive heart failure  Shabria Tilles is a 77 y.o. female with Lupus off medical therapies >5 years. Now on prednisone and Plaquenil, grade II diastolic congestive heart failure, thrombocytopenia, and history of bilateral pleural effusions.   10/23/2015 Hospital Follow Up: Patient presents to the office for hospital follow-up of bilateral pleural effusions which were treated with bilateral thoracentesis.She had been off treatment for her Lupus for >5 years, is now on prednisone, lasix  and Plaquenil. She denies shortness of breath and states she is feeling much better.Denies chest pain, SOB, fever, leg or calf pain.She denies cough. She has noted swelling in her lower extremities. She is compliant with lasix treatment, prednisone, Plaquenil and Lipitor. She has followed up with Dr. Kathi Ludwig, rheumatology, and is following up with her PCP next week. She is currently on 80 mg of prednisone daily which is being managed by Dr. Kathi Ludwig..  Significant Events/Procedures:  Admit date: 10/03/2015 Discharge date: 10/05/2015  Principal Problem:   Bilateral pleural effusion (Recurrent) Active Problems:   Acute respiratory failure with hypoxia (HCC)   Left ventricular diatolic dysfunction, NYHA class 2   SLE (systemic lupus erythematosus) (HCC)   Recurrent pleural effusion on left   Pleural effusion, bilateral   SOB (shortness of breath)  Procedures:  4/25 left thoracentesis yielding 1.45 L clear yellow fluid 4/26 right thoracentesis Yielded 1 liter of clear yellow fluid. 09/22/2015: Echo: 65-70%  EF  10/23/2015: CXR: Small-to-moderate-sized bilateral pleural effusions slightly less than that seen on October 04 2015. There is no pneumothorax.   OV 10/16/2016  Chief Complaint  Patient presents with   Follow-up    pt states breathing is ok but she does get a little winded here and there chest xray showed fluid on her lungs so Dr. Arnette Felts told her to see her pulmonologist   77 year old African-American female who I'm seeing for the first time. Last seen in our office just over a year ago. Based on review of the chart and her history she tells me that she is living [place] and was diagnosed with lupus of the joints and skin rash over 10 years ago. Some 6 years ago she moved to [place]. She was already on chronic prednisone. Some 4 years ago she establish with [facility] rheumatology Department. Since then she's been on Plaquenil. Then approximately in April 2017 started developing bilateral pleural effusions but review of the chart shows transudate and nondiagnostic cytology on 3 occasions all in April 2017. She's been maintained on Lasix with potassium. This has kept her under control. Then she reports that earlier this year she saw her Medicare physician Dr. Nicholos Johns and chest x-ray suggested pleural effusion. Indicates approximately a month ago. The same thing is some pleural effusion. She only has mild dyspnea on extreme exertion. She's been referred back to pulmonary. I do not have the most recent chest x-ray from primary care physician's office. But I did  visualize all the 2017 films and I concur with the report. Other than that she feels well.   11/05/2016 Follow up : Pleural effusions Patient returns for a two-week follow-up.  Patient was seen in the office for evaluation of bilateral pleural effusions. Patient was seen in during hospitalization. April 2017 for bilateral pleural effusions. Patient underwent thoracentesis twice in April 2017. Was felt to be  transudate of effusions. Patient was felt this may be secondary to her lupus. And was referred to rheumatology. She is followed by rheumatology and is currently on prednisone 10 mg and Plaquenil.  Echo April 2017 showed normal EF and grade 2 diastolic dysfunction. Patient is on Lasix 80 mg daily .Seen by PCP last month told bnp was nml .  Last visit. Patient was set up for a thoracentesis or left pleural effusion with 330 cc of pleural fluid removed. It appeared to be transudate. Cytology was negative for malignant cells..  Sudie Grumbling 01/26/2018  Chief Complaint  Patient presents with   Acute Visit    Pt has been having pain in lungs x2 weeks. Pt states the pain is mainly pressure and she is also having pain in her back. Pt is also having SOB and tightness in chest. Pt denies any cough.   77 year old female personally not seen in this practice for over a year. She is known to have lupus with diastolic dysfunctionand transudative left pleural effusion over a year ago. She follows with Dr. Kathi Ludwig for her lupus which is treated with Imuran, prednisone and Plaquenil. She is here acutely because for the last few to several weeks she's had upper back suprascapular and infrascapular spasms with pain associated with deep inspiration but she also has palpable tenderness in this area. She also feels heavy in the central chest area and particularly feels dyspneic when she lies down. She also feels like she has to take a good deep breath in order to feelthat she is getting enough oxygen. Dyspnea is also worse when she exerts herself. She feels that her pleural effusion might be coming back she also feels that it might be similar to her pulmonary embolism that she suffered 20 years ago while in [place] [is a new history that we got today] for which she took Coumadin at that time. This no orthopnea or edema or paroxysmal nocturnal dyspnea  She had CT abdomen in December 2018 lung cutthat I personally visualized showed very  minimal pleural effusion. December 2018 creatinine was normal  OV 09/26/2020  Subjective:  Patient ID: Lahoma Crocker, female , DOB: 30-Nov-1945 , age 49 y.o. , MRN: [id number] , ADDRESS: [address] [place] [address] PCP Rossie Muskrat, MD Patient Care Team: Rossie Muskrat, MD as PCP - General (Rheumatology)  This Provider for this visit: Treatment Team:  Attending Provider: Kalman Shan, MD    09/26/2020 -   Chief Complaint  Patient presents with   Follow-up    Chest and back pain/tightness for 2 weeks. Occ dry cough   Acute visit  HPI Seeley Karmann 77 y.o. -female with history of lupus erythematosus on prednisone, Plaquenil and azathioprine according to history.  Sees Dr. Kathi Ludwig in [facility] rheumatology.  Last visit with the rheumatologist was 3 months ago with normal lab work.  She has not been seen in this practice for 3 years almost.  She is here because for the last 2 weeks she has had nonspecific musculoskeletal pain in the center part of the chest left and right infra axillary area and also in the back.  This pain is made worse by twisting motions of the chest and the upper back.  It is not associated with exertion.  There is  no associated shortness of breath or wheezing orthopnea paroxysmal nocturnal dyspnea fever or chills.  She is really worried about her previous pleural effusion coming back.  She wants investigation for this she does not want blood work.   OV 11/01/2020  Subjective:  Patient ID: Lahoma Crocker, female , DOB: 1946/01/06 , age 73 y.o. , MRN: [id number] , ADDRESS: [address] [place] [address] PCP Rossie Muskrat, MD Patient Care Team: Rossie Muskrat, MD as PCP - General (Rheumatology)  This Provider for this visit: Treatment Team:  Attending Provider: Kalman Shan, MD    11/01/2020 -   Chief Complaint  Patient presents with   Follow-up    Chest xray performed 5/20 and echo performed 5/23.  Pt states she is still having pain in  her sides, ribs, which radiates to the back. Denies any complaints of cough.     HPI Aeriel Madron 77 y.o. -presents for her atypical chest pain work-up.  She tells me that the pain is still ongoing.  Its been ongoing for a month.  She points to her right inframammary right infra axillary area.  It goes to the back.  She says positions such as standing up or lying down make it worse.  I asked her to rotate her body with her standing and this did not exacerbate the pain.  When I pressed that she could feel the pain but it was not tender.  There is no other fever or any other symptoms.  She underwent CT abdomen.  The lung images are clear I personally visualized this.  There is a gastric lipoma.  Dr. Adela Lank is going to do endoscopy.  There is a possibility that this might be causing the pain although recently this is in a different location.  She had a chest x-ray that is clear I personally visualized this.  She also had echocardiogram that is normal.  I explained to her that I do not know the cause for this pain.  There is no wheezing or cough or fever or chills.  She says she tried a course of prednisone with Dr. Kathi Ludwig and did not help.   Narrative & Impression  CLINICAL DATA:  Chest pain   EXAM: CHEST - 2 VIEW   COMPARISON:  09/16/2019   FINDINGS: Heart and mediastinal contours are within normal limits. No focal opacities or effusions. No acute bony abnormality.   IMPRESSION: No active cardiopulmonary disease.     Electronically Signed   By: Charlett Nose M.D.   On: 10/27/2020 11:06      CT abd 10/17/20  Narrative & Impression  CLINICAL DATA:  Right upper quadrant abdominal pain.   EXAM: CT ABDOMEN AND PELVIS WITH CONTRAST   TECHNIQUE: Multidetector CT imaging of the abdomen and pelvis was performed using the standard protocol following bolus administration of intravenous contrast.   CONTRAST:  75mL OMNIPAQUE IOHEXOL 300 MG/ML  SOLN   COMPARISON:  Ultrasound September 28, 2020 and CT abdomen pelvis June 06, 2017   FINDINGS: Lower chest: No acute abnormality.  Small hiatal hernia.   Hepatobiliary: Mild diffuse hepatic steatosis. No suspicious hepatic lesion. Gallbladder is unremarkable. No biliary ductal dilation.   Pancreas: Within normal limits.   Spleen: Atrophic   Adrenals/Urinary Tract: Bilateral adrenal glands are unremarkable.   1.2 cm cyst in the right lower pole. Tiny bilateral hypodense renal lesions which are technically too small to accurately characterize but favored represent cysts. Symmetric enhancement excretion of contrast in the bilateral  kidneys. No suspicious filling defect visualized within the opacified portions of the collecting system and proximal ureters on delayed imaging.   Urinary bladder is decompressed limiting evaluation   Stomach/Bowel: Small hiatal hernia. Increased size of the fat density lesion indent gastric antrum which now measures 4.1 x 2.4 cm previously 2.4 x 1.7 cm no pathologic dilation of small bowel. Appendix is grossly unremarkable. Enteric contrast visualized to the level of the descending colon. Moderate volume of formed stool throughout the colon.   Vascular/Lymphatic: Aortic atherosclerosis without aneurysmal dilation. No pathologically enlarged abdominal or pelvic lymph nodes.   Reproductive: Leiomyomatous uterus.  No suspicious adnexal lesions.   Other: No abdominopelvic ascites. Pelvic floor laxity with small cystocele.   Musculoskeletal: Mild multilevel degenerative changes spine. Diffuse demineralization of bone. No acute osseous abnormality.   IMPRESSION: 1. No acute abnormality in the abdomen or pelvis. 2. Increased size of the 4.1 cm fat density lesion projecting into the gastric antrum, favored to represent a gastric lipoma. 3. Mild diffuse hepatic steatosis. 4. Moderate volume of formed stool throughout the colon. 5. Pelvic floor laxity with small cystocele. 6. Aortic  atherosclerosis.   Aortic Atherosclerosis (ICD10-I70.0).     Electronically Signed   By: Maudry Mayhew MD   On: 10/18/2020 15:1     ECHOCARDIOGRAM COMPLETE  Result Date: 10/30/2020    ECHOCARDIOGRAM REPORT   Patient Name:   DESTINEE SUING Date of Exam: 10/30/2020 Medical Rec #:  [id number]     Height:       61.0 in Accession #:    [id number]    Weight:       160.8 lb Date of Birth:  [birth year]     BSA:          1.722 m Patient Age:    74 years      BP:           116/64 mmHg Patient Gender: F             HR:           84 bpm. Exam Location:  [address] Procedure: 2D Echo, Cardiac Doppler and Color Doppler Indications:    R07.9 Chest pain  History:        Patient has prior history of Echocardiogram examinations, most                 recent 02/03/2018. Signs/Symptoms:Chest Pain; Risk                 Factors:Dyslipidemia.  Sonographer:    Samule Ohm RDCS Referring Phys: 23 Ricky Doan IMPRESSIONS  1. Left ventricular ejection fraction, by estimation, is 60 to 65%. The left ventricle has normal function. The left ventricle has no regional wall motion abnormalities. Left ventricular diastolic parameters were normal.  2. Right ventricular systolic function is normal. The right ventricular size is normal.  3. Left atrial size was mildly dilated.  4. The mitral valve is abnormal. Trivial mitral valve regurgitation. No evidence of mitral stenosis.  5. The aortic valve is normal in structure. Aortic valve regurgitation is not visualized. No aortic stenosis is present.  6. The inferior vena cava is normal in size with greater than 50% respiratory variability, suggesting right atrial pressure of 3 mmHg. FINDINGS  Left Ventricle: Left ventricular ejection fraction, by estimation, is 60 to 65%. The left ventricle has normal function. The left ventricle has no regional wall motion abnormalities. The left ventricular internal cavity size was normal in size. There is  no left ventricular hypertrophy. Left  ventricular diastolic parameters were normal. Right Ventricle: The right ventricular size is normal. No increase in right ventricular wall thickness. Right ventricular systolic function is normal. Left Atrium: Left atrial size was mildly dilated. Right Atrium: Right atrial size was normal in size. Pericardium: There is no evidence of pericardial effusion. Mitral Valve: The mitral valve is abnormal. There is mild thickening of the mitral valve leaflet(s). There is mild calcification of the mitral valve leaflet(s). Mild mitral annular calcification. Trivial mitral valve regurgitation. No evidence of mitral valve stenosis. Tricuspid Valve: The tricuspid valve is normal in structure. Tricuspid valve regurgitation is not demonstrated. No evidence of tricuspid stenosis. Aortic Valve: The aortic valve is normal in structure. Aortic valve regurgitation is not visualized. No aortic stenosis is present. Pulmonic Valve: The pulmonic valve was normal in structure. Pulmonic valve regurgitation is not visualized. No evidence of pulmonic stenosis. Aorta: The aortic root is normal in size and structure. Venous: The inferior vena cava is normal in size with greater than 50% respiratory variability, suggesting right atrial pressure of 3 mmHg. IAS/Shunts: No atrial level shunt detected by color flow Doppler.  LEFT VENTRICLE PLAX 2D LVIDd:         4.10 cm  Diastology LVIDs:         2.50 cm  LV e' medial:    6.28 cm/s LV PW:         1.00 cm  LV E/e' medial:  15.4 LV IVS:        0.80 cm  LV e' lateral:   6.36 cm/s LVOT diam:     1.80 cm  LV E/e' lateral: 15.2 LV SV:         62 LV SV Index:   36 LVOT Area:     2.54 cm  RIGHT VENTRICLE             IVC RV S prime:     17.24 cm/s  IVC diam: 0.90 cm TAPSE (M-mode): 2.4 cm LEFT ATRIUM             Index       RIGHT ATRIUM           Index LA diam:        3.30 cm 1.92 cm/m  RA Pressure: 3.00 mmHg LA Vol (A2C):   62.1 ml 36.07 ml/m RA Area:     12.60 cm LA Vol (A4C):   81.4 ml 47.28 ml/m RA  Volume:   29.50 ml  17.14 ml/m LA Biplane Vol: 73.6 ml 42.75 ml/m  AORTIC VALVE LVOT Vmax:   114.80 cm/s LVOT Vmean:  81.380 cm/s LVOT VTI:    0.243 m  AORTA Ao Root diam: 3.10 cm Ao Asc diam:  2.80 cm MV E velocity: 96.82 cm/s   TRICUSPID VALVE MV A velocity: 113.80 cm/s  Estimated RAP:  3.00 mmHg MV E/A ratio:  0.85                             SHUNTS                             Systemic VTI:  0.24 m                             Systemic Diam: 1.80 cm Charlton Haws MD Electronically signed by Theron Arista  Eden Emms MD Signature Date/Time: 10/30/2020/10:53:11 AM    Final       OV 04/18/2023  Subjective:  Patient ID: [name], female , DOB: 1946-05-24 , age 55 y.o. , MRN: [id number] , ADDRESS: [address] [place] [address] PCP Elie Confer, NP Patient Care Team: Elie Confer, NP as PCP - General  This Provider for this visit: Treatment Team:  Attending Provider: Kalman Shan, MD    04/18/2023 -   Chief Complaint  Patient presents with   Follow-up    Pt states has not been diagnosed with asthma. States when she breaths chest feels tight. No inhaler usage      HPI ASLYNN ZOLTOWSKI 77 y.o. -returns for follow-up.  I personally am not seen her in 2 and half years.  She was once seen for pleural effusion and also atypical chest pain in the setting of lupus.  Currently she tells me overall she was doing really well without the safety of Chest pains but some few months ago the atypical chest pain returned.  She is pointing to the sternal area.  On and off.  It happens when she does things beyond that she is not able to elaborate.  She also fell 2 weeks ago and then since then is having left infra axillary pleuritic pain.  She absolutely does not want to do a CT scan of the chest.  She believes the lupus under control on prednisone Imuran and Plaquenil.  There is no shortness of breath no cough no wheezing no fevers no chills no weight loss.  She only wants a chest x-ray today.  I  have asked her to get an echocardiogram as well.  Otherwise no other new issues.  No hemoptysis.  I personally visualized the CT abdomen that she had in the summer 2024.  She seems to have a right lower lobe atelectasis which was not present in the previous year.  I did show this to her but despite this she does not want to have a CT chest.   PFT      No data to display            LAB RESULTS last 96 hours No results found.  LAB RESULTS last 90 days No results found for this or any previous visit (from the past 2160 hour(s)).       has a past medical history of Arthritis, Back stiffness (03/13/2017), Cataract, Chronic abdominal pain, Diverticulosis, Gallbladder polyp, GERD (gastroesophageal reflux disease), History of colon polyps, HLD (hyperlipidemia), Lipoma of stomach, Lupus (2014), and Pleural effusion (09/2015).   reports that she has never smoked. She has never used smokeless tobacco.  Past Surgical History:  Procedure Laterality Date   BREAST EXCISIONAL BIOPSY Right    CATARACT EXTRACTION, BILATERAL     COLONOSCOPY     ESOPHAGOGASTRODUODENOSCOPY N/A 09/09/2017   Procedure: ESOPHAGOGASTRODUODENOSCOPY (EGD);  Surgeon: Benancio Deeds, MD;  Location: [facility];  Service: Gastroenterology;  Laterality: N/A;   KYPHOPLASTY N/A 02/08/2021   Procedure: KYPHOPLASTY THORACIC 7, THORACIC 9, THORACIC 10;  Surgeon: Venita Lick, MD;  Location: [facility];  Service: Orthopedics;  Laterality: N/A;   REPLACEMENT TOTAL KNEE Right    TOTAL KNEE ARTHROPLASTY Left 04/24/2020   Procedure: TOTAL KNEE ARTHROPLASTY;  Surgeon: Ollen Gross, MD;  Location: [facility];  Service: Orthopedics;  Laterality: Left;    UPPER GASTROINTESTINAL ENDOSCOPY      Allergies  Allergen Reactions   Penicillins Shortness Of Breath and  Palpitations    Has patient had a PCN reaction causing immediate rash, facial/tongue/throat swelling, SOB or lightheadedness with hypotension: Yes Has patient had a  PCN reaction causing severe rash involving mucus membranes or skin necrosis: No Has patient had a PCN reaction that required hospitalization No Has patient had a PCN reaction occurring within the last 10 years: No If all of the above answers are "NO", then may proceed with Cephalosporin use.     Immunization History  Administered Date(s) Administered   Influenza, High Dose Seasonal PF 03/30/2015, 03/06/2016, 04/10/2017   Influenza, Quadrivalent, Recombinant, Inj, Pf 03/20/2017, 02/24/2018, 03/04/2019, 03/03/2020   Influenza,inj,Quad PF,6+ Mos 04/10/2016   Influenza,inj,quad, With Preservative 03/10/2020   Influenza,trivalent, recombinat, inj, PF 02/19/2023   Influenza-Unspecified 06/10/2018   PFIZER(Purple Top)SARS-COV-2 Vaccination 08/06/2019, 09/01/2019   PPD Test 04/05/2015   Pneumococcal Conjugate-13 02/19/2013   Pneumococcal Polysaccharide-23 01/23/2017   Zoster Recombinant(Shingrix) 08/17/2019    Family History  Problem Relation Age of Onset   Asthma Mother    Colon cancer Sister    Colon polyps Brother    Other Brother        colon surgery, growth removed   Bladder Cancer Brother    Breast cancer Daughter        double masectomy   Colon cancer Other        niece, deceased 09-23-16  Esophageal cancer Neg Hx    Rectal cancer Neg Hx    Stomach cancer Neg Hx      Current Outpatient Medications:    AMBULATORY NON FORMULARY MEDICATION, Medication Name: Topical diltiazem 2 % and Lidocaine 5% for anal fissure- pea sized amount applied TID until healed, Disp: 30 g, Rfl: 0   azaTHIOprine (IMURAN) 50 MG tablet, Take 100 mg by mouth daily. , Disp: , Rfl: 2   carboxymethylcellul-glycerin (REFRESH OPTIVE) 0.5-0.9 % ophthalmic solution, Place 1 drop into both eyes 2 (two) times daily as needed for dry eyes., Disp: , Rfl:    famotidine (PEPCID) 20 MG tablet, Take 1 tablet (20 mg total) by mouth daily as needed for indigestion or heartburn., Disp: , Rfl:    furosemide (LASIX) 80  MG tablet, Take 1 tablet (80 mg total) by mouth daily. Please have PCP manage going forward. (Patient taking differently: Take 40 mg by mouth daily. Please have PCP manage going forward.pt taking 40 mg a day), Disp: 30 tablet, Rfl: 0   hydroxychloroquine (PLAQUENIL) 200 MG tablet, Take 1 tablet (200 mg total) by mouth daily. (Patient taking differently: Take 400 mg by mouth daily.), Disp: 30 tablet, Rfl: 0   KLOR-CON M20 20 MEQ tablet, TAKE 1 TABLET (20 MEQ TOTAL) BY MOUTH DAILY. PLEASE HAVE PCP MANAGE GOING FORWARD., Disp: 30 tablet, Rfl: 0   Potassium Chloride ER 20 MEQ TBCR, Take 10 tablets by mouth daily., Disp: , Rfl:    predniSONE (DELTASONE) 5 MG tablet, Take 5 mg by mouth daily with breakfast., Disp: , Rfl:    tiZANidine (ZANAFLEX) 4 MG tablet, Take 1 tablet (4 mg total) by mouth every 6 (six) hours as needed for muscle spasms., Disp: 20 tablet, Rfl: 0      Objective:   Vitals:   04/18/23 0923  BP: 118/76  Pulse: 78  SpO2: 97%  Weight: 153 lb (69.4 kg)  Height: 5\' 1"  (1.549 m)    Estimated body mass index is 28.91 kg/m as calculated from the following:   Height as of this encounter: 5\' 1"  (1.549 m).   Weight  as of this encounter: 153 lb (69.4 kg).  @WEIGHTCHANGE @  [facility] Weights   04/18/23 0923  Weight: 153 lb (69.4 kg)     Physical Exam   General: No distress. Looks well O2 at rest: no Cane present: no Sitting in wheel chair: no Frail: no Obese: no Neuro: Alert and Oriented x 3. GCS 15. Speech normal Psych: Pleasant Resp:  Barrel Chest - no.  Wheeze - no, Crackles - no, No overt respiratory distress CVS: Normal heart sounds. Murmurs - no Ext: Stigmata of Connective Tissue Disease - no HEENT: Normal upper airway. PEERL +. No post nasal drip        Assessment:       ICD-10-CM   1. Atypical chest pain  R07.89     2. History of pleural effusion  Z87.09     3. History of systemic lupus erythematosus (SLE) (HCC)  M32.9     4. Immunosuppressed status  (HCC)  D84.9          Plan:     Patient Instructions     ICD-10-CM   1. History of systemic lupus erythematosus (SLE) (HCC)  M32.9   2. History of pleural effusion  Z87.09   3. Chest pain, atypical  R07.89    Pain sounds very musculoskeletal and not lung related Ct abdomen lung images look clear May 2022 and June/july 2023 but some scarring lung base June 2024 Echocardiogram normal May 2022    No pulmonary causes for pain identified  Plan - CXR 2 view 04/18/2023   - no CT chest per your wishes and shared decision making - ECHO next few to several weeks   Followup 5-10 weeks with APP and 1 year with Dr Marchelle Gearing   FOLLOWUP Return in about 7 weeks (around 06/06/2023) for 15 min visit, with any of the APPS, Face to Face Visit .    SIGNATURE    Dr. Kalman Shan, M.D., F.C.C.P,  Pulmonary and Critical Care Medicine Staff Physician, [facility] Center Director - Interstitial Lung Disease  Program  Pulmonary Fibrosis [facility] at [facility] [place], [place], [address]  Pager: [phone], If no answer or between  15:00h - 7:00h: call [phone] Telephone: [phone]  10:13 AM 04/18/2023

## 2023-04-17 NOTE — Patient Instructions (Addendum)
ICD-10-CM   1. History of systemic lupus erythematosus (SLE) (HCC)  M32.9   2. History of pleural effusion  Z87.09   3. Chest pain, atypical  R07.89    Pain sounds very musculoskeletal and not lung related Ct abdomen lung images look clear May 2022 and June/july 2023 but some scarring lung base June 2024 Echocardiogram normal May 2022    No pulmonary causes for pain identified  Plan - CXR 2 view 04/18/2023   - no CT chest per your wishes and shared decision making - ECHO next few to several weeks   Followup 5-10 weeks with APP and 1 year with Dr Marchelle Gearing

## 2023-04-18 ENCOUNTER — Ambulatory Visit: Payer: Medicare Other

## 2023-04-18 ENCOUNTER — Encounter: Payer: Self-pay | Admitting: Internal Medicine

## 2023-04-18 ENCOUNTER — Ambulatory Visit (INDEPENDENT_AMBULATORY_CARE_PROVIDER_SITE_OTHER): Payer: Medicare Other | Admitting: Internal Medicine

## 2023-04-18 VITALS — BP 118/76 | HR 78 | Ht 61.0 in | Wt 153.0 lb

## 2023-04-18 DIAGNOSIS — Z8709 Personal history of other diseases of the respiratory system: Secondary | ICD-10-CM

## 2023-04-18 DIAGNOSIS — M329 Systemic lupus erythematosus, unspecified: Secondary | ICD-10-CM | POA: Diagnosis not present

## 2023-04-18 DIAGNOSIS — D849 Immunodeficiency, unspecified: Secondary | ICD-10-CM

## 2023-04-18 DIAGNOSIS — R0789 Other chest pain: Secondary | ICD-10-CM

## 2023-04-18 DIAGNOSIS — R0602 Shortness of breath: Secondary | ICD-10-CM

## 2023-05-26 ENCOUNTER — Other Ambulatory Visit (HOSPITAL_COMMUNITY): Payer: Medicare Other

## 2023-05-27 ENCOUNTER — Ambulatory Visit (HOSPITAL_COMMUNITY): Payer: Medicare Other | Attending: Cardiology

## 2023-05-27 DIAGNOSIS — R0602 Shortness of breath: Secondary | ICD-10-CM | POA: Insufficient documentation

## 2023-05-27 LAB — ECHOCARDIOGRAM COMPLETE
Area-P 1/2: 3.63 cm2
P 1/2 time: 212 ms
S' Lateral: 2.8 cm

## 2023-06-19 ENCOUNTER — Ambulatory Visit (INDEPENDENT_AMBULATORY_CARE_PROVIDER_SITE_OTHER): Payer: Medicare Other | Admitting: Primary Care

## 2023-06-19 ENCOUNTER — Encounter: Payer: Self-pay | Admitting: Primary Care

## 2023-06-19 VITALS — BP 122/62 | HR 73 | Temp 98.6°F | Ht 60.0 in | Wt 156.8 lb

## 2023-06-19 DIAGNOSIS — K219 Gastro-esophageal reflux disease without esophagitis: Secondary | ICD-10-CM

## 2023-06-19 DIAGNOSIS — R0789 Other chest pain: Secondary | ICD-10-CM

## 2023-06-19 MED ORDER — OMEPRAZOLE 20 MG PO CPDR: 20.0000 mg | DELAYED_RELEASE_CAPSULE | Freq: Every day | ORAL | 0 refills | Status: DC

## 2023-06-19 NOTE — Patient Instructions (Addendum)
-  CHEST DISCOMFORT AND PALPITATIONS: Your chest discomfort and palpitations may be related to findings from your echocardiogram, which showed mild changes in your heart's structure and function. We will refer you to a cardiologist for further evaluation and management, and you should continue to monitor your symptoms.  -POSSIBLE GASTROESOPHAGEAL REFLUX DISEASE (GERD): GERD is a condition where stomach acid frequently flows back into the tube connecting your mouth and stomach, causing discomfort. To help manage this, you will start taking Omeprazole  daily for 2-4 weeks. Please take it 30 minutes before eating in the morning.   INSTRUCTIONS  Please follow up with a cardiologist for further evaluation of your chest discomfort and palpitations. Continue taking Omeprazole  daily as directed and monitor your symptoms. If you notice any changes or have concerns, schedule a follow-up appointment.

## 2023-06-19 NOTE — Progress Notes (Signed)
 @Patient  ID: Jane Chavez, female    DOB: 1945/06/28, 78 y.o.   MRN: [id number]  Chief Complaint  Patient presents with   Follow-up    Feeling some better, midchest pain off/on, had cold at Christmas with cough-yellow, but better now, sob at times    Referring provider: Cristopher Suzen HERO, NP  HPI: 78 year old female, smoked.  Past medical history significant for diastolic dysfunction, recurrent pleural effusion, acute respiratory failure, GERD, hyperlipidemia, systemic lupus.  Patient of Dr. Geronimo.   06/19/2023 Discussed the use of AI scribe software for clinical note transcription with the patient, who gave verbal consent to proceed.  History of Present Illness   The patient, a 78 year old with a history of bronchitis, presented with intermittent chest tightness and palpitations. These symptoms began around Christmas, during a bout of bronchitis, characterized by a productive cough with yellow sputum. The cough has since resolved, but the chest tightness and palpitations persist. The patient describes the chest tightness as a sensation of 'fluttering' in the heart, which is not associated with any specific activity and tends to resolve with rest. She denies active chest pain today.   An echocardiogram performed in December 2024 revealed mild left ventricular hypertrophy, impaired relaxation of the left ventricle (grade one diastolic dysfunction), and mild enlargement of the right ventricle. There was also evidence of mitral valve calcification and mild pulmonic valve regurgitation.  The patient also reported episodes of what she believed to be acid reflux, which were relieved by taking Pepcid . She has been on prednisone , which could potentially increase the risk of reflux. The patient has made dietary changes, which she believes have helped with these symptoms.     Allergies  Allergen Reactions   Penicillins Shortness Of Breath and Palpitations    Has patient had a PCN reaction  causing immediate rash, facial/tongue/throat swelling, SOB or lightheadedness with hypotension: Yes Has patient had a PCN reaction causing severe rash involving mucus membranes or skin necrosis: No Has patient had a PCN reaction that required hospitalization No Has patient had a PCN reaction occurring within the last 10 years: No If all of the above answers are NO, then may proceed with Cephalosporin use.     Immunization History  Administered Date(s) Administered   Influenza, High Dose Seasonal PF 03/30/2015, 03/06/2016, 04/10/2017   Influenza, Quadrivalent, Recombinant, Inj, Pf 03/20/2017, 02/24/2018, 03/04/2019, 03/03/2020   Influenza,inj,Quad PF,6+ Mos 04/10/2016   Influenza,inj,quad, With Preservative 03/10/2020   Influenza,trivalent, recombinat, inj, PF 02/19/2023   Influenza-Unspecified 06/10/2018   PFIZER(Purple Top)SARS-COV-2 Vaccination 08/06/2019, 09/01/2019   PPD Test 04/05/2015   Pneumococcal Conjugate-13 02/19/2013   Pneumococcal Polysaccharide-23 01/23/2017   Zoster Recombinant(Shingrix) 08/17/2019    Past Medical History:  Diagnosis Date   Arthritis    RA   Back stiffness 03/13/2017   Pt.'s Dr. thinks it is Lupus related.   Cataract    removed both eyes   Chronic abdominal pain    Diverticulosis    Gallbladder polyp    GERD (gastroesophageal reflux disease)    History of colon polyps    HLD (hyperlipidemia)    Lipoma of stomach    Lupus 2014   Pleural effusion 09/2015    Tobacco History: Social History   Tobacco Use  Smoking Status Never  Smokeless Tobacco Never   Counseling given: Not Answered   Outpatient Medications Prior to Visit  Medication Sig Dispense Refill   AMBULATORY NON FORMULARY MEDICATION Medication Name: Topical diltiazem 2 % and Lidocaine  5% for anal fissure-  pea sized amount applied TID until healed 30 g 0   azaTHIOprine (IMURAN) 50 MG tablet Take 100 mg by mouth daily.   2   carboxymethylcellul-glycerin (REFRESH OPTIVE)  0.5-0.9 % ophthalmic solution Place 1 drop into both eyes 2 (two) times daily as needed for dry eyes.     furosemide  (LASIX ) 80 MG tablet Take 1 tablet (80 mg total) by mouth daily. Please have PCP manage going forward. (Patient taking differently: Take 40 mg by mouth daily. Please have PCP manage going forward.pt taking 40 mg a day) 30 tablet 0   hydroxychloroquine  (PLAQUENIL ) 200 MG tablet Take 1 tablet (200 mg total) by mouth daily. (Patient taking differently: Take 400 mg by mouth daily.) 30 tablet 0   KLOR-CON  M20 20 MEQ tablet TAKE 1 TABLET (20 MEQ TOTAL) BY MOUTH DAILY. PLEASE HAVE PCP MANAGE GOING FORWARD. 30 tablet 0   predniSONE  (DELTASONE ) 5 MG tablet Take 5 mg by mouth daily with breakfast.     famotidine  (PEPCID ) 20 MG tablet Take 1 tablet (20 mg total) by mouth daily as needed for indigestion or heartburn. (Patient not taking: Reported on 06/19/2023)     Potassium Chloride  ER 20 MEQ TBCR Take 10 tablets by mouth daily.     tiZANidine  (ZANAFLEX ) 4 MG tablet Take 1 tablet (4 mg total) by mouth every 6 (six) hours as needed for muscle spasms. (Patient not taking: Reported on 06/19/2023) 20 tablet 0   No facility-administered medications prior to visit.   Review of Systems  Review of Systems  Constitutional: Negative.   HENT: Negative.    Respiratory: Negative.  Negative for cough and shortness of breath.   Cardiovascular:        Intermittent chest discomfort and palpitations relieved by rest    Physical Exam  BP 122/62 (BP Location: Right Arm)   Pulse 73   Temp 98.6 F (37 C) (Temporal)   Ht 5' (1.524 m)   Wt 156 lb 12.8 oz (71.1 kg)   SpO2 99%   BMI 30.62 kg/m  Physical Exam Constitutional:      Appearance: Normal appearance.  HENT:     Head: Normocephalic and atraumatic.     Mouth/Throat:     Mouth: Mucous membranes are moist.     Pharynx: Oropharynx is clear.  Cardiovascular:     Rate and Rhythm: Normal rate and regular rhythm.     Heart sounds: No murmur  heard. Pulmonary:     Effort: Pulmonary effort is normal.     Breath sounds: Normal breath sounds. No wheezing or rhonchi.  Musculoskeletal:        General: Normal range of motion.  Skin:    General: Skin is warm and dry.  Neurological:     General: No focal deficit present.     Mental Status: She is alert and oriented to person, place, and time. Mental status is at baseline.  Psychiatric:        Mood and Affect: Mood normal.        Behavior: Behavior normal.        Thought Content: Thought content normal.        Judgment: Judgment normal.      Lab Results:  CBC    Component Value Date/Time   WBC 8.9 01/30/2021 1200   RBC 4.92 01/30/2021 1200   HGB 14.5 01/30/2021 1200   HCT 46.2 (H) 01/30/2021 1200   PLT 143 (L) 01/30/2021 1200   MCV 93.9 01/30/2021 1200   MCH  29.5 01/30/2021 1200   MCHC 31.4 01/30/2021 1200   RDW 14.5 01/30/2021 1200   LYMPHSABS 3.2 12/08/2020 1539   MONOABS 0.9 12/08/2020 1539   EOSABS 0.2 12/08/2020 1539   BASOSABS 0.0 12/08/2020 1539    BMET    Component Value Date/Time   NA 143 01/30/2021 1200   K 3.1 (L) 01/30/2021 1200   CL 108 01/30/2021 1200   CO2 28 01/30/2021 1200   GLUCOSE 84 01/30/2021 1200   BUN 9 01/30/2021 1200   CREATININE 0.80 12/12/2021 0938   CALCIUM  9.3 01/30/2021 1200   GFRNONAA >60 01/30/2021 1200   GFRAA >60 10/04/2015 0724    BNP    Component Value Date/Time   BNP 20.7 10/03/2015 1009    ProBNP No results found for: PROBNP  Imaging: ECHOCARDIOGRAM COMPLETE Result Date: 05/27/2023    ECHOCARDIOGRAM REPORT   Patient Name:   TEMPLE EWART Date of Exam: 05/27/2023 Medical Rec #:  [id number]       Height:       61.0 in Accession #:    [id number]      Weight:       153.0 lb Date of Birth:  09-11-1945       BSA:          1.686 m Patient Age:    77 years        BP:           118/76 mmHg Patient Gender: F               HR:           68 bpm. Exam Location:  [address] Procedure: 2D Echo, Cardiac Doppler and Color  Doppler Indications:    R06.00 SOB  History:        Patient has prior history of Echocardiogram examinations, most                 recent 10/30/2020. Lupus, Signs/Symptoms:Shortness of Breath;                 Risk Factors:Dyslipidemia and H/o pleural effusion.  Sonographer:    Elsie Bohr RDCS Referring Phys: [id number] MURALI RAMASWAMY IMPRESSIONS  1. Left ventricular ejection fraction, by estimation, is 60 to 65%. The left ventricle has normal function. The left ventricle has no regional wall motion abnormalities. There is mild left ventricular hypertrophy of the septal segment. Left ventricular diastolic parameters are consistent with Grade I diastolic dysfunction (impaired relaxation).  2. Right ventricular systolic function is normal. The right ventricular size is mildly enlarged. There is normal pulmonary artery systolic pressure. The estimated right ventricular systolic pressure is 32.6 mmHg.  3. The mitral valve is normal in structure. No evidence of mitral valve regurgitation. No evidence of mitral stenosis. Moderate to severe mitral annular calcification.  4. The aortic valve is normal in structure. Aortic valve regurgitation is not visualized. No aortic stenosis is present.  5. Pulmonic valve regurgitation is moderate.  6. The inferior vena cava is normal in size with greater than 50% respiratory variability, suggesting right atrial pressure of 3 mmHg. FINDINGS  Left Ventricle: Left ventricular ejection fraction, by estimation, is 60 to 65%. The left ventricle has normal function. The left ventricle has no regional wall motion abnormalities. The left ventricular internal cavity size was normal in size. There is  mild left ventricular hypertrophy of the septal segment. Left ventricular diastolic parameters are consistent with Grade I diastolic dysfunction (impaired relaxation). Right Ventricle: The right ventricular  size is mildly enlarged. No increase in right ventricular wall thickness. Right ventricular  systolic function is normal. There is normal pulmonary artery systolic pressure. The tricuspid regurgitant velocity is 2.72  m/s, and with an assumed right atrial pressure of 3 mmHg, the estimated right ventricular systolic pressure is 32.6 mmHg. Left Atrium: Left atrial size was normal in size. Right Atrium: Right atrial size was normal in size. Pericardium: There is no evidence of pericardial effusion. Mitral Valve: The mitral valve is normal in structure. Moderate to severe mitral annular calcification. No evidence of mitral valve regurgitation. No evidence of mitral valve stenosis. Tricuspid Valve: The tricuspid valve is normal in structure. Tricuspid valve regurgitation is mild . No evidence of tricuspid stenosis. Aortic Valve: The aortic valve is normal in structure. Aortic valve regurgitation is not visualized. Aortic regurgitation PHT measures 212 msec. No aortic stenosis is present. Pulmonic Valve: The pulmonic valve was normal in structure. Pulmonic valve regurgitation is moderate. No evidence of pulmonic stenosis. Aorta: The aortic root is normal in size and structure. Venous: The inferior vena cava is normal in size with greater than 50% respiratory variability, suggesting right atrial pressure of 3 mmHg. IAS/Shunts: No atrial level shunt detected by color flow Doppler.  LEFT VENTRICLE PLAX 2D LVIDd:         4.20 cm   Diastology LVIDs:         2.80 cm   LV e' medial:    7.15 cm/s LV PW:         1.00 cm   LV E/e' medial:  12.3 LV IVS:        1.20 cm   LV e' lateral:   7.83 cm/s LVOT diam:     1.80 cm   LV E/e' lateral: 11.2 LV SV:         57 LV SV Index:   34 LVOT Area:     2.54 cm  RIGHT VENTRICLE            IVC RVSP:           32.6 mmHg  IVC diam: 1.50 cm LEFT ATRIUM             Index        RIGHT ATRIUM           Index LA diam:        4.10 cm 2.43 cm/m   RA Pressure: 3.00 mmHg LA Vol (A2C):   42.6 ml 25.27 ml/m  RA Area:     19.00 cm LA Vol (A4C):   51.6 ml 30.61 ml/m  RA Volume:   55.80 ml  33.11  ml/m LA Biplane Vol: 47.6 ml 28.24 ml/m  AORTIC VALVE LVOT Vmax:   110.00 cm/s LVOT Vmean:  70.860 cm/s LVOT VTI:    0.225 m AI PHT:      212 msec  AORTA Ao Root diam: 2.90 cm Ao Asc diam:  3.20 cm MITRAL VALVE                TRICUSPID VALVE MV Area (PHT): 3.63 cm     TR Peak grad:   29.6 mmHg MV Decel Time: 209 msec     TR Vmax:        272.00 cm/s MV E velocity: 87.72 cm/s   Estimated RAP:  3.00 mmHg MV A velocity: 105.60 cm/s  RVSP:           32.6 mmHg MV E/A ratio:  0.83  SHUNTS                             Systemic VTI:  0.22 m                             Systemic Diam: 1.80 cm Oneil Parchment MD Electronically signed by Oneil Parchment MD Signature Date/Time: 05/27/2023/12:02:58 PM    Final      Assessment & Plan:   1. Atypical chest pain (Primary) - Ambulatory referral to Cardiology  2. Gastroesophageal reflux disease, unspecified whether esophagitis present     Chest Discomfort and Palpitations Patient experiences intermittent chest discomfort and palpitations. Echocardiogram showed mild left ventricular hypertrophy, grade 1 DD, mild enlargement of the right ventricle, calcification of the mitral valve, and pulmonic valve regurgitation. These findings could be contributing to the palpitations. Chest X-ray in November was normal. -Refer to a cardiologist for further evaluation and management. -Continue monitoring symptoms.  Possible Gastroesophageal Reflux Disease (GERD) Reports of chest discomfort and a history of acid reflux symptoms. Currently on prednisone , which can increase the risk of reflux. -Start Omeprazole  20mg  daily for 2-4 weeks to reduce acid production in the stomach and potentially alleviate chest discomfort. -Advise to take Omeprazole  30 minutes before eating in the morning.  General Health Maintenance -Continue current medications as prescribed. -Follow-up as needed for any changes in symptoms.      Almarie LELON Ferrari, NP 06/19/2023

## 2023-07-15 ENCOUNTER — Other Ambulatory Visit: Payer: Self-pay | Admitting: Primary Care

## 2023-07-23 ENCOUNTER — Other Ambulatory Visit: Payer: Self-pay | Admitting: Surgery

## 2023-07-23 DIAGNOSIS — K824 Cholesterolosis of gallbladder: Secondary | ICD-10-CM

## 2023-07-23 DIAGNOSIS — R1011 Right upper quadrant pain: Secondary | ICD-10-CM

## 2023-08-14 ENCOUNTER — Ambulatory Visit
Admission: RE | Admit: 2023-08-14 | Discharge: 2023-08-14 | Disposition: A | Discharge status: Home / Self Care | Payer: Medicare Other | Source: Ambulatory Visit | Attending: Surgery | Admitting: Surgery

## 2023-08-14 DIAGNOSIS — R1011 Right upper quadrant pain: Secondary | ICD-10-CM

## 2023-08-14 DIAGNOSIS — K824 Cholesterolosis of gallbladder: Secondary | ICD-10-CM

## 2023-08-20 ENCOUNTER — Ambulatory Visit: Payer: Medicare Other | Attending: Cardiology | Admitting: Cardiology

## 2023-08-20 ENCOUNTER — Encounter: Payer: Self-pay | Admitting: Cardiology

## 2023-08-20 VITALS — BP 118/80 | HR 84 | Resp 16 | Ht 60.0 in | Wt 156.4 lb

## 2023-08-20 DIAGNOSIS — I2510 Atherosclerotic heart disease of native coronary artery without angina pectoris: Secondary | ICD-10-CM

## 2023-08-20 DIAGNOSIS — I700 Atherosclerosis of aorta: Secondary | ICD-10-CM

## 2023-08-20 DIAGNOSIS — R002 Palpitations: Secondary | ICD-10-CM

## 2023-08-20 DIAGNOSIS — R072 Precordial pain: Secondary | ICD-10-CM | POA: Diagnosis present

## 2023-08-20 DIAGNOSIS — R9431 Abnormal electrocardiogram [ECG] [EKG]: Secondary | ICD-10-CM

## 2023-08-20 MED ORDER — ATORVASTATIN CALCIUM 10 MG PO TABS: 10.0000 mg | ORAL_TABLET | Freq: Every day | ORAL | 0 refills | Status: DC

## 2023-08-20 NOTE — Patient Instructions (Addendum)
 Medication Instructions:  Your physician has recommended you make the following change in your medication:   1) START atorvastatin 10 mg daily  *If you need a refill on your cardiac medications before your next appointment, please call your pharmacy*  Testing/Procedures: Your physician has requested that you have an exercise stress myoview. For further information please visit [email or web address] Please follow instruction sheet, as given.   Follow-Up: As needed  Other Instructions Exercise Myoview (Stress Test) Instructions  Please arrive 15 minutes prior to your appointment time for registration and insurance purposes.   The test will take approximately 3 to 4 hours to complete; you may bring reading material.  If someone comes with you to your appointment, they will need to remain in the main lobby due to limited space in the testing area. **If you are pregnant or breastfeeding, please notify the nuclear lab prior to your appointment**   How to prepare for your Myocardial Perfusion Test: Do not eat or drink 3 hours prior to your test, except you may have water. Do not consume products containing caffeine (regular or decaffeinated) 12 hours prior to your test. (ex: coffee, chocolate, sodas, tea). Do bring a list of your current medications with you.  If not listed below, you may take your medications as normal. Do wear comfortable clothes (no dresses or overalls) and walking shoes, tennis shoes preferred (No heels or open toe shoes are allowed). Do NOT wear cologne, perfume, aftershave, or lotions (deodorant is allowed). If these instructions are not followed, your test will have to be rescheduled.   Please report to [address] for your test.  If you have questions or concerns about your appointment, you can call the Nuclear Lab at [phone].   If you cannot keep your appointment, please provide 24 hours notification to the Nuclear Lab, to avoid a possible $50  charge to your account.      1st Floor: - Lobby - Registration  - Pharmacy  - Lab - Cafe  2nd Floor: - PV Lab - Diagnostic Testing (echo, CT, nuclear med)  3rd Floor: - Vacant  4th Floor: - TCTS (cardiothoracic surgery) - AFib Clinic - Structural Heart Clinic - Vascular Surgery  - Vascular Ultrasound  5th Floor: - [facility] (general and EP) - Clinical Pharmacy for coumadin, hypertension, lipid, weight-loss medications, and med management appointments    Valet parking services will be available as well.

## 2023-08-20 NOTE — Progress Notes (Signed)
 Cardiology Office Note:  .   Date:  08/20/2023  ID:  Jane Chavez, DOB 1945-11-03, MRN [id number] PCP: [facility]  [facility] Providers Cardiologist:  None   History of Present Illness: .   Jane Chavez is a 78 y.o. African-American female patient with prior history of tobacco use disorder history of lupus, GERD, hypercholesterolemia, referred to me for evaluation of chest pain and dyspnea on exertion.  She has mild abdominal aortic atherosclerosis by ultrasound of the abdomen and CT of the abdomen pelvis performed in 2024 along with hepatic steatosis.  Patient states that for the past 2 to 3 months she has not noticed chest tightness in the middle of the chest, comes on with exertion activity but sometimes at rest as well.  Describes it as tightness last for 5 to 10 minutes with exertional activity and sometimes relieved with rest.  But she has also noticed similar pain when she is at home doing routine activities or even resting.  Sometimes associated with palpitation.  She is also noticed palpitations, described as skipped beats that lasts a few seconds, mostly noted during rest and with physical activity.  She denies dyspnea, dizziness or syncope.  Denies symptoms of claudication.  She is a non-smoker.   Labs    Lab Results  Component Value Date   NA 143 01/30/2021   K 3.1 (L) 01/30/2021   CO2 28 01/30/2021   GLUCOSE 84 01/30/2021   BUN 9 01/30/2021   CREATININE 0.80 12/12/2021   CALCIUM 9.3 01/30/2021   GFR 73.88 01/26/2018   GFRNONAA >60 01/30/2021      Latest Ref Rng & Units 12/12/2021    9:38 AM 01/30/2021   12:00 PM 12/08/2020    3:39 PM  BMP  Glucose 70 - 99 mg/dL  84  73   BUN 8 - 23 mg/dL  9  14   Creatinine 1.19 - 1.00 mg/dL 1.47  8.29  5.62   Sodium 135 - 145 mmol/L  143  142   Potassium 3.5 - 5.1 mmol/L  3.1  4.1   Chloride 98 - 111 mmol/L  108  108   CO2 22 - 32 mmol/L  28  28   Calcium 8.9 - 10.3 mg/dL  9.3  9.2        Latest Ref Rng & Units 01/30/2021   12:00 PM 12/08/2020    3:39 PM 04/26/2020    3:22 AM  CBC  WBC 4.0 - 10.5 K/uL 8.9  10.8  13.9   Hemoglobin 12.0 - 15.0 g/dL 13.0  86.5  78.4   Hematocrit 36.0 - 46.0 % 46.2  46.6  40.1   Platelets 150 - 400 K/uL 143  172  123    No results found for: "HGBA1C"  Lab Results  Component Value Date   TSH 1.227 09/22/2015    External Labs:  NA  Review of Systems  Cardiovascular:  Positive for chest pain. Negative for dyspnea on exertion and leg swelling.   Physical Exam:   VS:  BP 118/80 (BP Location: Left Arm, Patient Position: Sitting, Cuff Size: Normal)   Pulse 84   Resp 16   Ht 5' (1.524 m)   Wt 156 lb 6.4 oz (70.9 kg)   SpO2 96%   BMI 30.54 kg/m    Wt Readings from Last 3 Encounters:  08/20/23 156 lb 6.4 oz (70.9 kg)  06/19/23 156 lb 12.8 oz (71.1 kg)  04/18/23 153 lb (69.4 kg)  Physical Exam Constitutional:      Appearance: She is obese.  Neck:     Vascular: No carotid bruit or JVD.  Cardiovascular:     Rate and Rhythm: Normal rate and regular rhythm.     Pulses: Intact distal pulses.     Heart sounds: Normal heart sounds. No murmur heard.    No gallop.  Pulmonary:     Effort: Pulmonary effort is normal.     Breath sounds: Normal breath sounds.  Abdominal:     General: Bowel sounds are normal.     Palpations: Abdomen is soft.  Musculoskeletal:     Right lower leg: No edema.     Left lower leg: No edema.    Studies Reviewed: Marland Kitchen    Chest x-ray two-view 04/18/2023: Cardiac silhouette is unremarkable. No pneumothorax or pleural effusion. The lungs are clear. Aorta is calcified. Multilevel thoracic vertebroplasties.  ECHOCARDIOGRAM COMPLETE 05/27/2023  1. Left ventricular ejection fraction, by estimation, is 60 to 65%. The left ventricle has normal function. The left ventricle has no regional wall motion abnormalities. There is mild left ventricular hypertrophy of the septal segment. Left ventricular diastolic parameters are  consistent with Grade I diastolic dysfunction (impaired relaxation). 2. Right ventricular systolic function is normal. The right ventricular size is mildly enlarged. There is normal pulmonary artery systolic pressure. The estimated right ventricular systolic pressure is 32.6 mmHg. 3. The mitral valve is normal in structure. No evidence of mitral valve regurgitation. No evidence of mitral stenosis. Moderate to severe mitral annular calcification. 4. The aortic valve is normal in structure. Aortic valve regurgitation is not visualized. No aortic stenosis is present. 5. Pulmonic valve regurgitation is moderate. 6. The inferior vena cava is normal in size with greater than 50% respiratory variability, suggesting right atrial pressure of 3 mmHg.  EKG:    EKG Interpretation Date/Time:  Wednesday August 20 2023 10:21:30 EDT Ventricular Rate:  73 PR Interval:  174 QRS Duration:  78 QT Interval:  388 QTC Calculation: 427 R Axis:   34  Text Interpretation: EKG 08/20/2023: Normal sinus rhythm at the rate of 73 bpm, normal axis, nonspecific inferior lateral T wave abnormalities.  Compared to 02/08/2021, inferolateral T wave abnormality new. Confirmed by Delrae Rend [phone]) on 08/20/2023 10:32:39 AM     Medications and allergies    Allergies  Allergen Reactions   Penicillins Shortness Of Breath and Palpitations    Has patient had a PCN reaction causing immediate rash, facial/tongue/throat swelling, SOB or lightheadedness with hypotension: Yes Has patient had a PCN reaction causing severe rash involving mucus membranes or skin necrosis: No Has patient had a PCN reaction that required hospitalization No Has patient had a PCN reaction occurring within the last 10 years: No If all of the above answers are "NO", then may proceed with Cephalosporin use.     Current Outpatient Medications:    AMBULATORY NON FORMULARY MEDICATION, Medication Name: Topical diltiazem 2 % and Lidocaine 5% for anal fissure-  pea sized amount applied TID until healed, Disp: 30 g, Rfl: 0   atorvastatin (LIPITOR) 10 MG tablet, Take 1 tablet (10 mg total) by mouth daily., Disp: 90 tablet, Rfl: 0   azaTHIOprine (IMURAN) 50 MG tablet, Take 100 mg by mouth daily. , Disp: , Rfl: 2   carboxymethylcellul-glycerin (REFRESH OPTIVE) 0.5-0.9 % ophthalmic solution, Place 1 drop into both eyes 2 (two) times daily as needed for dry eyes., Disp: , Rfl:    famotidine (PEPCID) 20 MG tablet, Take 1  tablet (20 mg total) by mouth daily as needed for indigestion or heartburn., Disp: , Rfl:    furosemide (LASIX) 40 MG tablet, Take 40 mg by mouth daily., Disp: , Rfl:    hydroxychloroquine (PLAQUENIL) 200 MG tablet, Take 1 tablet (200 mg total) by mouth daily. (Patient taking differently: Take 400 mg by mouth daily.), Disp: 30 tablet, Rfl: 0   KLOR-CON M20 20 MEQ tablet, TAKE 1 TABLET (20 MEQ TOTAL) BY MOUTH DAILY. PLEASE HAVE PCP MANAGE GOING FORWARD., Disp: 30 tablet, Rfl: 0   omeprazole (PRILOSEC) 20 MG capsule, TAKE 1 CAPSULE BY MOUTH EVERY DAY, Disp: 90 capsule, Rfl: 1   Potassium Chloride ER 20 MEQ TBCR, Take 10 tablets by mouth daily., Disp: , Rfl:    predniSONE (DELTASONE) 5 MG tablet, Take 5 mg by mouth daily with breakfast., Disp: , Rfl:    tiZANidine (ZANAFLEX) 4 MG tablet, Take 1 tablet (4 mg total) by mouth every 6 (six) hours as needed for muscle spasms., Disp: 20 tablet, Rfl: 0   ASSESSMENT AND PLAN: .      ICD-10-CM   1. Precordial pain  R07.2 EKG 12-Lead    EKG 12-Lead    Cardiac Stress Test: Informed Consent Details: Physician/Practitioner Attestation; Transcribe to consent form and obtain patient signature    MYOCARDIAL PERFUSION IMAGING    2. Palpitations  R00.2     3. Nonspecific abnormal electrocardiogram (ECG) (EKG)  R94.31     4. Aortic atherosclerosis (HCC)  I70.0 EKG 12-Lead    atorvastatin (LIPITOR) 10 MG tablet    Cardiac Stress Test: Informed Consent Details: Physician/Practitioner Attestation; Transcribe to  consent form and obtain patient signature    MYOCARDIAL PERFUSION IMAGING    5. Coronary artery calcification seen on CAT scan  I25.10 Cardiac Stress Test: Informed Consent Details: Physician/Practitioner Attestation; Transcribe to consent form and obtain patient signature    MYOCARDIAL PERFUSION IMAGING     1. Precordial pain (Primary) Patient's exertional chest discomfort suggestive of angina pectoris however some of the symptoms are at rest and suggest noncardiac chest pain.  Extremely difficult to make out however in view of lupus as a risk factor, coronary atherosclerosis and aortic atherosclerosis risk factor, it would be worthwhile to proceed with exercise nuclear stress testing.  Given her connective tissue disease, she is at extreme high risk for vascular events.  Also because of this, I will go ahead and start her on atorvastatin 10 mg daily, will try to obtain labs from her PCP.  Goal LDL should be closer to 70.  I will request him to follow-up on the same, 90-day prescription for Lipitor 10 mg was given to the patient and refills with her PCP. She also has new abnormal EKG with inferolateral nonspecific T wave inversions, ischemic excluded.  - EKG 12-Lead - EKG 12-Lead - Cardiac Stress Test: Informed Consent Details: Physician/Practitioner Attestation; Transcribe to consent form and obtain patient signature  2. Palpitations Patient has occasional episodes of palpitations mostly occurring at rest then with exertional activities are physical activity, lasting a few seconds, the suggest PACs and PVCs.  I simply reassured her.  3. Aortic atherosclerosis (HCC) Reviewed her chart extensively, prior CT scan of the chest, abdomen, ultrasound of the abdomen also reviewed.  She does have hepatic steatosis as well.  Discussed regarding weight loss and lifestyle changes.  Daily exercise also discussed.  I am starting her on Lipitor 10 mg daily.  - EKG 12-Lead - atorvastatin (LIPITOR) 10 MG  tablet; Take  1 tablet (10 mg total) by mouth daily.  Dispense: 90 tablet; Refill: 0 - Cardiac Stress Test: Informed Consent Details: Physician/Practitioner Attestation; Transcribe to consent form and obtain patient signature  4. Coronary artery calcification seen on CAT scan As dictated above. - Cardiac Stress Test: Informed Consent Details: Physician/Practitioner Attestation; Transcribe to consent form and obtain patient signature  I will personally perform the test and if I find abnormalities,  will perform further evaluation. Otherwise unless new on ongoing symptoms(patient advised to contact us), preventive  therapy is recommended. I will then see the patient on a PRN basis.      Signed,  Yates Decamp, MD, [facility] 08/20/2023, 12:05 PM [facility] [address] [place], [place] [address] Phone: [phone]. Fax:  [phone]

## 2023-08-29 ENCOUNTER — Telehealth (HOSPITAL_COMMUNITY): Payer: Self-pay | Admitting: *Deleted

## 2023-08-29 NOTE — Telephone Encounter (Signed)
 Patient given detailed instructions per Myocardial Perfusion Study Information Sheet for the test on 09/02/2023 at 8:00. Patient notified to arrive 15 minutes early and that it is imperative to arrive on time for appointment to keep from having the test rescheduled.  If you need to cancel or reschedule your appointment, please call the office within 24 hours of your appointment. . Patient verbalized understanding.Jane Chavez

## 2023-09-01 ENCOUNTER — Ambulatory Visit (HOSPITAL_COMMUNITY): Attending: Cardiovascular Disease

## 2023-09-01 ENCOUNTER — Encounter: Payer: Self-pay | Admitting: Cardiology

## 2023-09-01 DIAGNOSIS — I2510 Atherosclerotic heart disease of native coronary artery without angina pectoris: Secondary | ICD-10-CM | POA: Insufficient documentation

## 2023-09-01 DIAGNOSIS — R072 Precordial pain: Secondary | ICD-10-CM | POA: Insufficient documentation

## 2023-09-01 DIAGNOSIS — I700 Atherosclerosis of aorta: Secondary | ICD-10-CM | POA: Diagnosis not present

## 2023-09-01 LAB — MYOCARDIAL PERFUSION IMAGING
LV dias vol: 70 mL (ref 46–106)
LV sys vol: 26 mL
Nuc Stress EF: 63 %
Peak HR: 85 {beats}/min
Rest HR: 67 {beats}/min
Rest Nuclear Isotope Dose: 10.7 mCi
SDS: 0
SRS: 0
SSS: 0
ST Depression (mm): 0 mm
Stress Nuclear Isotope Dose: 32.3 mCi
TID: 1.17

## 2023-09-01 MED ORDER — TECHNETIUM TC 99M TETROFOSMIN IV KIT
32.3000 | PACK | Freq: Once | INTRAVENOUS | Status: AC | PRN
  Administered 2023-09-01: 32.3 via INTRAVENOUS

## 2023-09-01 MED ORDER — REGADENOSON 0.4 MG/5ML IV SOLN
0.4000 mg | Freq: Once | INTRAVENOUS | Status: AC
  Administered 2023-09-01: 0.4 mg via INTRAVENOUS

## 2023-09-01 MED ORDER — TECHNETIUM TC 99M TETROFOSMIN IV KIT
10.7000 | PACK | Freq: Once | INTRAVENOUS | Status: AC | PRN
  Administered 2023-09-01: 10.7 via INTRAVENOUS

## 2023-09-01 NOTE — Progress Notes (Signed)
 Normal stress test

## 2023-09-05 ENCOUNTER — Telehealth: Payer: Self-pay | Admitting: Cardiology

## 2023-09-05 NOTE — Telephone Encounter (Signed)
 Patient states she is returning call. Please advise

## 2023-09-05 NOTE — Telephone Encounter (Signed)
 Returned pt call. Results given, no questions at this time.

## 2023-09-11 ENCOUNTER — Encounter (INDEPENDENT_AMBULATORY_CARE_PROVIDER_SITE_OTHER): Payer: Medicare Other | Admitting: Ophthalmology

## 2023-09-24 ENCOUNTER — Encounter (INDEPENDENT_AMBULATORY_CARE_PROVIDER_SITE_OTHER): Admitting: Ophthalmology

## 2023-09-26 ENCOUNTER — Other Ambulatory Visit: Payer: Self-pay | Admitting: Family Medicine

## 2023-09-26 DIAGNOSIS — Z780 Asymptomatic menopausal state: Secondary | ICD-10-CM

## 2023-11-04 NOTE — Progress Notes (Signed)
 [facility] - Clinic Note  11/05/2023   CHIEF COMPLAINT Patient presents for Retina Follow Up  HISTORY OF PRESENT ILLNESS: Jane Chavez is a 78 y.o. female who presents to the clinic today for:  HPI     Retina Follow Up   Patient presents with  Other.  In both eyes.  This started 10 months ago.  Duration of 10 months.  Since onset it is stable.        Comments   Pt presents for 10 month retina f/u, midzonal CR scarring and drusen OU. Pt states no changes in vision but she is noticing more sensitivity to oncoming headlights during the day. Pt denies FOL, floaters, pain. Pt is using Blink BID OU.       Last edited by Carrington Clack, COT on 11/05/2023  8:44 AM.     Patient is complaining about glare in the day time.   Referring physician: Florance Hun, MD [address] [place],  [place] [address]  HISTORICAL INFORMATION:  Selected notes from the MEDICAL RECORD NUMBER Referred by Dr. Carloyn Chi for retina eval LEE:  Ocular Hx- PMH- SLE on plaquenil  (200mg  daily)   CURRENT MEDICATIONS: Current Outpatient Medications (Ophthalmic Drugs)  Medication Sig   carboxymethylcellul-glycerin (REFRESH OPTIVE) 0.5-0.9 % ophthalmic solution Place 1 drop into both eyes 2 (two) times daily as needed for dry eyes.   No current facility-administered medications for this visit. (Ophthalmic Drugs)   Current Outpatient Medications (Other)  Medication Sig   AMBULATORY NON FORMULARY MEDICATION Medication Name: Topical diltiazem 2 % and Lidocaine  5% for anal fissure- pea sized amount applied TID until healed   atorvastatin  (LIPITOR) 10 MG tablet Take 1 tablet (10 mg total) by mouth daily.   azaTHIOprine (IMURAN) 50 MG tablet Take 100 mg by mouth daily.    ergocalciferol (VITAMIN D2) 1.25 MG (50000 UT) capsule 1 capsule.   famotidine  (PEPCID ) 20 MG tablet Take 1 tablet (20 mg total) by mouth daily as needed for indigestion or heartburn.   furosemide  (LASIX ) 40 MG tablet  Take 40 mg by mouth daily.   hydroxychloroquine  (PLAQUENIL ) 200 MG tablet Take 1 tablet (200 mg total) by mouth daily. (Patient taking differently: Take 400 mg by mouth daily.)   KLOR-CON  M20 20 MEQ tablet TAKE 1 TABLET (20 MEQ TOTAL) BY MOUTH DAILY. PLEASE HAVE PCP MANAGE GOING FORWARD.   omeprazole  (PRILOSEC) 20 MG capsule TAKE 1 CAPSULE BY MOUTH EVERY DAY   Potassium Chloride  ER 20 MEQ TBCR Take 10 tablets by mouth daily.   predniSONE  (DELTASONE ) 5 MG tablet Take 5 mg by mouth daily with breakfast.   tiZANidine  (ZANAFLEX ) 4 MG tablet Take 1 tablet (4 mg total) by mouth every 6 (six) hours as needed for muscle spasms.   No current facility-administered medications for this visit. (Other)   REVIEW OF SYSTEMS: ROS   Positive for: Musculoskeletal, Eyes Last edited by Carrington Clack, COT on 11/05/2023  8:36 AM.      ALLERGIES Allergies  Allergen Reactions   Penicillins Shortness Of Breath and Palpitations    Has patient had a PCN reaction causing immediate rash, facial/tongue/throat swelling, SOB or lightheadedness with hypotension: Yes Has patient had a PCN reaction causing severe rash involving mucus membranes or skin necrosis: No Has patient had a PCN reaction that required hospitalization No Has patient had a PCN reaction occurring within the last 10 years: No If all of the above answers are "NO", then may proceed with  Cephalosporin use.    PAST MEDICAL HISTORY Past Medical History:  Diagnosis Date   Arthritis    RA   Back stiffness 03/13/2017   Pt.'s Dr. thinks it is Lupus related.   Cataract    removed both eyes   Chronic abdominal pain    Diverticulosis    Gallbladder polyp    GERD (gastroesophageal reflux disease)    History of colon polyps    HLD (hyperlipidemia)    Lipoma of stomach    Lupus 2014   Pleural effusion 09/2015   Past Surgical History:  Procedure Laterality Date   BREAST EXCISIONAL BIOPSY Right    CATARACT EXTRACTION, BILATERAL     COLONOSCOPY      ESOPHAGOGASTRODUODENOSCOPY N/A 09/09/2017   Procedure: ESOPHAGOGASTRODUODENOSCOPY (EGD);  Surgeon: Ace Holder, MD;  Location: [facility];  Service: Gastroenterology;  Laterality: N/A;   KYPHOPLASTY N/A 02/08/2021   Procedure: KYPHOPLASTY THORACIC 7, THORACIC 9, THORACIC 10;  Surgeon: Mort Ards, MD;  Location: [facility];  Service: Orthopedics;  Laterality: N/A;   REPLACEMENT TOTAL KNEE Right    TOTAL KNEE ARTHROPLASTY Left 04/24/2020   Procedure: TOTAL KNEE ARTHROPLASTY;  Surgeon: Liliane Rei, MD;  Location: [facility];  Service: Orthopedics;  Laterality: Left;    UPPER GASTROINTESTINAL ENDOSCOPY     FAMILY HISTORY Family History  Problem Relation Age of Onset   Asthma Mother    Colon cancer Sister    Colon polyps Brother    Other Brother        colon surgery, growth removed   Bladder Cancer Brother    Breast cancer Daughter        double masectomy   Colon cancer Other        niece, deceased 08-13-16  Esophageal cancer Neg Hx    Rectal cancer Neg Hx    Stomach cancer Neg Hx    SOCIAL HISTORY Social History   Tobacco Use   Smoking status: Never   Smokeless tobacco: Never  Vaping Use   Vaping status: Never Used  Substance Use Topics   Alcohol  use: No   Drug use: No       OPHTHALMIC EXAM:  Base Eye Exam     Visual Acuity (Snellen - Linear)       Right Left   Dist cc 20/30 +2 20/20 -1   Dist ph cc NI          Tonometry (Tonopen, 8:50 AM)       Right Left   Pressure 14 12         Pupils       Pupils Dark Light Shape React APD   Right PERRL 3 2 Round Brisk None   Left PERRL 3 2 Round Brisk None         Visual Fields       Left Right    Full Full         Extraocular Movement       Right Left    Full, Ortho Full, Ortho         Neuro/Psych     Oriented x3: Yes         Dilation     Both eyes: 1.0% Mydriacyl, 2.5% Phenylephrine  @ 8:52 AM           Slit Lamp and Fundus Exam     External Exam        Right Left   External Normal Normal  Slit Lamp Exam       Right Left   Lids/Lashes Dermatochalasis - upper lid Dermatochalasis - upper lid   Conjunctiva/Sclera White and quiet White and quiet   Cornea arcus, well healed cataract wound, EBMD arcus, well healed cataract wound, EBMD   Anterior Chamber deep and clear deep and clear   Iris Round and dilated Round and dilated   Lens PC IOL in good position with open PC PC IOL in good position   Anterior Vitreous mild syneresis, Posterior vitreous detachment mild syneresis         Fundus Exam       Right Left   Disc Pink and Sharp, Compact, mild PPP Trace Pallor, Sharp rim, mild PPA, mild elevation   C/D Ratio 0.1 0.1   Macula Flat, Good foveal reflex, RPE mottling, No heme or edema Flat, Good foveal reflex, RPE mottling, No heme or edema   Vessels mild attenuation, mild tortuosity attenuated, mild tortuosity   Periphery Attached, focal midzonal drusen, RPE mottling and clumping at 1130 outside of ST arcades, ?amelanotic nevus, focal pigmented CR atrophy at 0500 Attached, focal midzonal drusen, RPE mottling superior to disc, mild paving stone degeneration           Refraction     Wearing Rx       Sphere Cylinder Axis Add   Right -1.75 +1.25 003 +2.50   Left -0.75 +1.25 016 +2.50    Age: 73   Type: Bifocal           IMAGING AND PROCEDURES  Imaging and Procedures for 11/05/2023         ASSESSMENT/PLAN:   ICD-10-CM   1. Scarring, chorioretinal, bilateral  H31.003     2. Drusen  H35.369     3. Long-term use of Plaquenil   Z79.899 OCT, Retina - OU - Both Eyes    4. Systemic lupus erythematosus, unspecified SLE type, unspecified organ involvement status (HCC)  M32.9     5. Pseudophakia, both eyes  Z96.1       1,2. Midzonal CR scarring and drusen OU  - focal midzonal CR scarring and drusen superior to disc OU  - monitor  - f/u in 9-12 mos -- DFE/OCT  3,4. Plaquenil  (hydroxychloroquine  [HCQ]) use for  Lupus  - monitored by Dr. Carloyn Chi - no retinal toxicity noted on exam or OCT today - normal OCT OU - pt taking 200 mg daily for >10 years - pt reports wt is ~70 kg - 200/70 = 2.86 mg/kg/day - the AAO recommends daily dosing of < 5.0 mg/kg for HCQ -- below recommended threshold - f/u 9-12 months, DFE, OCT  5. Pseudophakia OU  - s/p CE/IOL (Dr. Lasandra Points, ~2020)  - IOL in good position, doing well  - monitor  Ophthalmic Meds Ordered this visit:  No orders of the defined types were placed in this encounter.    No follow-ups on file.  There are no Patient Instructions on file for this visit.  Explained the diagnoses, plan, and follow up with the patient and they expressed understanding.  Patient expressed understanding of the importance of proper follow up care.   This document serves as a record of services personally performed by Jeanice Millard, MD, PhD. It was created on their behalf by Angelia Kelp, an ophthalmic technician. The creation of this record is the provider's dictation and/or activities during the visit.    Electronically signed by: Angelia Kelp, OA, 11/05/23  9:37 AM  This document serves  as a record of services personally performed by Jeanice Millard, MD, PhD. It was created on their behalf by Morley Arabia. Bevin Bucks, OA an ophthalmic technician. The creation of this record is the provider's dictation and/or activities during the visit.    Electronically signed by: Morley Arabia. Bevin Bucks, OA 11/05/23 9:37 AM  This document serves as a record of services personally performed by Jeanice Millard, MD, PhD. It was created on their behalf by Olene Berne, COT an ophthalmic technician. The creation of this record is the provider's dictation and/or activities during the visit.    Electronically signed by:  Olene Berne, COT  11/05/23 9:41 AM   Jeanice Millard, M.D., Ph.D. Diseases & Surgery of the Retina and Vitreous [facility] 11/05/2023  \  Abbreviations: M myopia (nearsighted); A astigmatism; H hyperopia (farsighted); P presbyopia; Mrx spectacle prescription;  CTL contact lenses; OD right eye; OS left eye; OU both eyes  XT exotropia; ET esotropia; PEK punctate epithelial keratitis; PEE punctate epithelial erosions; DES dry eye syndrome; MGD meibomian gland dysfunction; ATs artificial tears; PFAT's preservative free artificial tears; NSC nuclear sclerotic cataract; PSC posterior subcapsular cataract; ERM epi-retinal membrane; PVD posterior vitreous detachment; RD retinal detachment; DM diabetes mellitus; DR diabetic retinopathy; NPDR non-proliferative diabetic retinopathy; PDR proliferative diabetic retinopathy; CSME clinically significant macular edema; DME diabetic macular edema; dbh dot blot hemorrhages; CWS cotton wool spot; POAG primary open angle glaucoma; C/D cup-to-disc ratio; HVF humphrey visual field; GVF goldmann visual field; OCT optical coherence tomography; IOP intraocular pressure; BRVO Branch retinal vein occlusion; CRVO central retinal vein occlusion; CRAO central retinal artery occlusion; BRAO branch retinal artery occlusion; RT retinal tear; SB scleral buckle; PPV pars plana vitrectomy; VH Vitreous hemorrhage; PRP panretinal laser photocoagulation; IVK intravitreal kenalog; VMT vitreomacular traction; MH Macular hole;  NVD neovascularization of the disc; NVE neovascularization elsewhere; AREDS age related eye disease study; ARMD age related macular degeneration; POAG primary open angle glaucoma; EBMD epithelial/anterior basement membrane dystrophy; ACIOL anterior chamber intraocular lens; IOL intraocular lens; PCIOL posterior chamber intraocular lens; Phaco/IOL phacoemulsification with intraocular lens placement; PRK photorefractive keratectomy; LASIK laser assisted in situ keratomileusis; HTN hypertension; DM diabetes mellitus; COPD chronic obstructive pulmonary disease

## 2023-11-05 ENCOUNTER — Encounter (INDEPENDENT_AMBULATORY_CARE_PROVIDER_SITE_OTHER): Payer: Self-pay | Admitting: Ophthalmology

## 2023-11-05 ENCOUNTER — Ambulatory Visit (INDEPENDENT_AMBULATORY_CARE_PROVIDER_SITE_OTHER): Admitting: Ophthalmology

## 2023-11-05 DIAGNOSIS — H31003 Unspecified chorioretinal scars, bilateral: Secondary | ICD-10-CM

## 2023-11-05 DIAGNOSIS — M329 Systemic lupus erythematosus, unspecified: Secondary | ICD-10-CM

## 2023-11-05 DIAGNOSIS — Z79899 Other long term (current) drug therapy: Secondary | ICD-10-CM | POA: Diagnosis not present

## 2023-11-05 DIAGNOSIS — H35369 Drusen (degenerative) of macula, unspecified eye: Secondary | ICD-10-CM

## 2023-11-05 DIAGNOSIS — Z961 Presence of intraocular lens: Secondary | ICD-10-CM

## 2023-11-06 ENCOUNTER — Emergency Department (HOSPITAL_COMMUNITY)

## 2023-11-06 ENCOUNTER — Encounter (HOSPITAL_COMMUNITY): Payer: Self-pay

## 2023-11-06 ENCOUNTER — Inpatient Hospital Stay (HOSPITAL_COMMUNITY)
Admission: EM | Admit: 2023-11-06 | Discharge: 2023-11-11 | DRG: 871 | Disposition: A | Discharge status: Home Health | Attending: Internal Medicine | Admitting: Internal Medicine

## 2023-11-06 ENCOUNTER — Other Ambulatory Visit: Payer: Self-pay

## 2023-11-06 DIAGNOSIS — Z1152 Encounter for screening for COVID-19: Secondary | ICD-10-CM | POA: Diagnosis not present

## 2023-11-06 DIAGNOSIS — N12 Tubulo-interstitial nephritis, not specified as acute or chronic: Secondary | ICD-10-CM | POA: Diagnosis present

## 2023-11-06 DIAGNOSIS — D6959 Other secondary thrombocytopenia: Secondary | ICD-10-CM | POA: Diagnosis present

## 2023-11-06 DIAGNOSIS — N3000 Acute cystitis without hematuria: Secondary | ICD-10-CM | POA: Diagnosis not present

## 2023-11-06 DIAGNOSIS — D696 Thrombocytopenia, unspecified: Secondary | ICD-10-CM | POA: Insufficient documentation

## 2023-11-06 DIAGNOSIS — K219 Gastro-esophageal reflux disease without esophagitis: Secondary | ICD-10-CM | POA: Diagnosis present

## 2023-11-06 DIAGNOSIS — J189 Pneumonia, unspecified organism: Principal | ICD-10-CM | POA: Diagnosis present

## 2023-11-06 DIAGNOSIS — R0902 Hypoxemia: Secondary | ICD-10-CM | POA: Diagnosis present

## 2023-11-06 DIAGNOSIS — M329 Systemic lupus erythematosus, unspecified: Secondary | ICD-10-CM | POA: Diagnosis present

## 2023-11-06 DIAGNOSIS — R9431 Abnormal electrocardiogram [ECG] [EKG]: Secondary | ICD-10-CM | POA: Insufficient documentation

## 2023-11-06 DIAGNOSIS — Z7960 Long term (current) use of unspecified immunomodulators and immunosuppressants: Secondary | ICD-10-CM

## 2023-11-06 DIAGNOSIS — Z800 Family history of malignant neoplasm of digestive organs: Secondary | ICD-10-CM | POA: Diagnosis not present

## 2023-11-06 DIAGNOSIS — Z8052 Family history of malignant neoplasm of bladder: Secondary | ICD-10-CM | POA: Diagnosis not present

## 2023-11-06 DIAGNOSIS — Z860100 Personal history of colon polyps, unspecified: Secondary | ICD-10-CM | POA: Diagnosis not present

## 2023-11-06 DIAGNOSIS — A419 Sepsis, unspecified organism: Principal | ICD-10-CM | POA: Diagnosis present

## 2023-11-06 DIAGNOSIS — I480 Paroxysmal atrial fibrillation: Secondary | ICD-10-CM | POA: Diagnosis present

## 2023-11-06 DIAGNOSIS — N390 Urinary tract infection, site not specified: Secondary | ICD-10-CM | POA: Diagnosis present

## 2023-11-06 DIAGNOSIS — Z825 Family history of asthma and other chronic lower respiratory diseases: Secondary | ICD-10-CM

## 2023-11-06 DIAGNOSIS — R5381 Other malaise: Secondary | ICD-10-CM | POA: Diagnosis present

## 2023-11-06 DIAGNOSIS — E785 Hyperlipidemia, unspecified: Secondary | ICD-10-CM | POA: Diagnosis present

## 2023-11-06 DIAGNOSIS — D849 Immunodeficiency, unspecified: Secondary | ICD-10-CM | POA: Diagnosis present

## 2023-11-06 DIAGNOSIS — Z83719 Family history of colon polyps, unspecified: Secondary | ICD-10-CM | POA: Diagnosis not present

## 2023-11-06 DIAGNOSIS — Z803 Family history of malignant neoplasm of breast: Secondary | ICD-10-CM | POA: Diagnosis not present

## 2023-11-06 DIAGNOSIS — Z96653 Presence of artificial knee joint, bilateral: Secondary | ICD-10-CM | POA: Diagnosis present

## 2023-11-06 DIAGNOSIS — N200 Calculus of kidney: Secondary | ICD-10-CM | POA: Diagnosis present

## 2023-11-06 DIAGNOSIS — Z7952 Long term (current) use of systemic steroids: Secondary | ICD-10-CM

## 2023-11-06 DIAGNOSIS — M328 Other forms of systemic lupus erythematosus: Secondary | ICD-10-CM | POA: Diagnosis not present

## 2023-11-06 DIAGNOSIS — Z79899 Other long term (current) drug therapy: Secondary | ICD-10-CM | POA: Diagnosis not present

## 2023-11-06 DIAGNOSIS — Z880 Allergy status to penicillin: Secondary | ICD-10-CM

## 2023-11-06 DIAGNOSIS — Z66 Do not resuscitate: Secondary | ICD-10-CM | POA: Diagnosis present

## 2023-11-06 DIAGNOSIS — R109 Unspecified abdominal pain: Secondary | ICD-10-CM | POA: Insufficient documentation

## 2023-11-06 LAB — CBC WITH DIFFERENTIAL/PLATELET
Abs Immature Granulocytes: 0.17 10*3/uL — ABNORMAL HIGH (ref 0.00–0.07)
Basophils Absolute: 0 10*3/uL (ref 0.0–0.1)
Basophils Relative: 0 %
Eosinophils Absolute: 0 10*3/uL (ref 0.0–0.5)
Eosinophils Relative: 0 %
HCT: 44.8 % (ref 36.0–46.0)
Hemoglobin: 14.3 g/dL (ref 12.0–15.0)
Immature Granulocytes: 1 %
Lymphocytes Relative: 4 %
Lymphs Abs: 0.7 10*3/uL (ref 0.7–4.0)
MCH: 29.3 pg (ref 26.0–34.0)
MCHC: 31.9 g/dL (ref 30.0–36.0)
MCV: 91.8 fL (ref 80.0–100.0)
Monocytes Absolute: 1 10*3/uL (ref 0.1–1.0)
Monocytes Relative: 5 %
Neutro Abs: 16.7 10*3/uL — ABNORMAL HIGH (ref 1.7–7.7)
Neutrophils Relative %: 90 %
Platelets: 45 10*3/uL — ABNORMAL LOW (ref 150–400)
RBC: 4.88 MIL/uL (ref 3.87–5.11)
RDW: 15.2 % (ref 11.5–15.5)
WBC: 18.6 10*3/uL — ABNORMAL HIGH (ref 4.0–10.5)
nRBC: 0 % (ref 0.0–0.2)

## 2023-11-06 LAB — COMPREHENSIVE METABOLIC PANEL WITH GFR
ALT: 33 U/L (ref 0–44)
AST: 48 U/L — ABNORMAL HIGH (ref 15–41)
Albumin: 3.2 g/dL — ABNORMAL LOW (ref 3.5–5.0)
Alkaline Phosphatase: 63 U/L (ref 38–126)
Anion gap: 8 (ref 5–15)
BUN: 11 mg/dL (ref 8–23)
CO2: 22 mmol/L (ref 22–32)
Calcium: 8.1 mg/dL — ABNORMAL LOW (ref 8.9–10.3)
Chloride: 107 mmol/L (ref 98–111)
Creatinine, Ser: 0.81 mg/dL (ref 0.44–1.00)
GFR, Estimated: >60 mL/min (ref >60)
Glucose, Bld: 110 mg/dL — ABNORMAL HIGH (ref 70–99)
Potassium: 3.4 mmol/L — ABNORMAL LOW (ref 3.5–5.1)
Sodium: 137 mmol/L (ref 135–145)
Total Bilirubin: 1.1 mg/dL (ref 0.0–1.2)
Total Protein: 7.5 g/dL (ref 6.5–8.1)

## 2023-11-06 LAB — URINALYSIS, W/ REFLEX TO CULTURE (INFECTION SUSPECTED)
Bilirubin Urine: NEGATIVE
Glucose, UA: NEGATIVE mg/dL
Ketones, ur: 5 mg/dL — AB
Nitrite: POSITIVE — AB
Protein, ur: NEGATIVE mg/dL
Specific Gravity, Urine: 1.011 (ref 1.005–1.030)
WBC, UA: >50 WBC/hpf (ref 0–5)
pH: 5 (ref 5.0–8.0)

## 2023-11-06 LAB — PROTIME-INR
INR: 1.1 (ref 0.8–1.2)
Prothrombin Time: 14 s (ref 11.4–15.2)

## 2023-11-06 LAB — RESP PANEL BY RT-PCR (RSV, FLU A&B, COVID)  RVPGX2
Influenza A by PCR: NEGATIVE
Influenza B by PCR: NEGATIVE
Resp Syncytial Virus by PCR: NEGATIVE
SARS Coronavirus 2 by RT PCR: NEGATIVE

## 2023-11-06 LAB — I-STAT CG4 LACTIC ACID, ED: Lactic Acid, Venous: 1.8 mmol/L (ref 0.5–1.9)

## 2023-11-06 LAB — STREP PNEUMONIAE URINARY ANTIGEN: Strep Pneumo Urinary Antigen: NEGATIVE

## 2023-11-06 LAB — PROCALCITONIN: Procalcitonin: 1.24 ng/mL

## 2023-11-06 LAB — CBG MONITORING, ED: Glucose-Capillary: 86 mg/dL (ref 70–99)

## 2023-11-06 LAB — TECHNOLOGIST SMEAR REVIEW: Plt Morphology: DECREASED

## 2023-11-06 LAB — C-REACTIVE PROTEIN: CRP: 20.5 mg/dL — ABNORMAL HIGH (ref <1.0)

## 2023-11-06 MED ORDER — IPRATROPIUM-ALBUTEROL 0.5-2.5 (3) MG/3ML IN SOLN
3.0000 mL | RESPIRATORY_TRACT | Status: DC | PRN
  Administered 2023-11-06 – 2023-11-07 (×2): 3 mL via RESPIRATORY_TRACT
  Filled 2023-11-06 (×2): qty 3

## 2023-11-06 MED ORDER — LEVOFLOXACIN IN D5W 750 MG/150ML IV SOLN
750.0000 mg | INTRAVENOUS | Status: DC
  Administered 2023-11-07 – 2023-11-09 (×3): 750 mg via INTRAVENOUS
  Filled 2023-11-06 (×3): qty 150

## 2023-11-06 MED ORDER — POTASSIUM CHLORIDE IN NACL 20-0.9 MEQ/L-% IV SOLN
INTRAVENOUS | Status: AC
  Filled 2023-11-06 (×3): qty 1000

## 2023-11-06 MED ORDER — MORPHINE SULFATE (PF) 2 MG/ML IV SOLN
2.0000 mg | Freq: Once | INTRAVENOUS | Status: AC
  Administered 2023-11-06: 2 mg via INTRAVENOUS
  Filled 2023-11-06: qty 1

## 2023-11-06 MED ORDER — ONDANSETRON HCL 4 MG/2ML IJ SOLN
4.0000 mg | Freq: Four times a day (QID) | INTRAMUSCULAR | Status: DC | PRN
  Administered 2023-11-06: 4 mg via INTRAVENOUS
  Filled 2023-11-06: qty 2

## 2023-11-06 MED ORDER — ACETAMINOPHEN 500 MG PO TABS
1000.0000 mg | ORAL_TABLET | Freq: Once | ORAL | Status: AC
  Administered 2023-11-06: 1000 mg via ORAL
  Filled 2023-11-06: qty 2

## 2023-11-06 MED ORDER — ENOXAPARIN SODIUM 40 MG/0.4ML IJ SOSY: 40.0000 mg | PREFILLED_SYRINGE | INTRAMUSCULAR | Status: DC

## 2023-11-06 MED ORDER — PREDNISONE 5 MG PO TABS
10.0000 mg | ORAL_TABLET | Freq: Every day | ORAL | Status: DC
  Administered 2023-11-07 – 2023-11-11 (×5): 10 mg via ORAL
  Filled 2023-11-06 (×5): qty 2

## 2023-11-06 MED ORDER — PREDNISONE 5 MG PO TABS: 5.0000 mg | ORAL_TABLET | Freq: Every day | ORAL | Status: DC

## 2023-11-06 MED ORDER — TIZANIDINE HCL 4 MG PO TABS: 4.0000 mg | ORAL_TABLET | Freq: Four times a day (QID) | ORAL | Status: DC | PRN

## 2023-11-06 MED ORDER — SODIUM CHLORIDE 0.9 % IV BOLUS
1000.0000 mL | Freq: Once | INTRAVENOUS | Status: AC
Start: 2023-11-06 — End: 2023-11-06
  Administered 2023-11-06: 1000 mL via INTRAVENOUS

## 2023-11-06 MED ORDER — PANTOPRAZOLE SODIUM 40 MG PO TBEC: 40.0000 mg | DELAYED_RELEASE_TABLET | Freq: Every day | ORAL | Status: DC

## 2023-11-06 MED ORDER — LEVOFLOXACIN IN D5W 750 MG/150ML IV SOLN
750.0000 mg | Freq: Once | INTRAVENOUS | Status: AC
  Administered 2023-11-06: 750 mg via INTRAVENOUS
  Filled 2023-11-06: qty 150

## 2023-11-06 NOTE — ED Triage Notes (Addendum)
 Patient arrived via EMS for dizziness, weakness, diarrhea. Patient states she has been feeling weak and dizzy for about 2 days, she went to bend over to pick something up today and she fell. Patient denies hitting head, denies LOC. Per EMS, unable to get orthostatics due to patient being so dizzy when standing. Temp 102.53F in triage.

## 2023-11-06 NOTE — ED Provider Notes (Signed)
 [place] [facility]ERGENCY DEPARTMENT AT [facility] Provider Note   CSN: [id number] Arrival date & time: 11/06/23  1345     History  Chief Complaint  Patient presents with   Dizziness    Jane Chavez is a 78 y.o. female.  With a history of SLE, pleural effusions and respiratory failure presents to the ED for weakness.  2 days of symptoms including dizziness, weakness, diarrhea some back pain.  Episode of near syncope at home today with no associated trauma.  Also notes fevers as well.  Denies chest pain shortness of breath   Dizziness      Home Medications Prior to Admission medications   Medication Sig Start Date End Date Taking? Authorizing Provider  AMBULATORY NON FORMULARY MEDICATION Medication Name: Topical diltiazem 2 % and Lidocaine  5% for anal fissure- pea sized amount applied TID until healed 07/19/22   Armbruster, Lendon Queen, MD  atorvastatin  (LIPITOR) 10 MG tablet Take 1 tablet (10 mg total) by mouth daily. 08/20/23 11/18/23  Knox Perl, MD  azaTHIOprine (IMURAN) 50 MG tablet Take 100 mg by mouth daily.  03/11/17   [provider]  carboxymethylcellul-glycerin (REFRESH OPTIVE) 0.5-0.9 % ophthalmic solution Place 1 drop into both eyes 2 (two) times daily as needed for dry eyes.    [provider]  ergocalciferol (VITAMIN D2) 1.25 MG (50000 UT) capsule 1 capsule. 09/28/23 03/26/24  [provider]  famotidine  (PEPCID ) 20 MG tablet Take 1 tablet (20 mg total) by mouth daily as needed for indigestion or heartburn. 04/10/21   Armbruster, Lendon Queen, MD  furosemide  (LASIX ) 40 MG tablet Take 40 mg by mouth daily.    [provider]  hydroxychloroquine  (PLAQUENIL ) 200 MG tablet Take 1 tablet (200 mg total) by mouth daily. Patient taking differently: Take 400 mg by mouth daily. 10/05/15   Lonita Roach, MD  KLOR-CON  M20 20 MEQ tablet TAKE 1 TABLET (20 MEQ TOTAL) BY MOUTH DAILY. PLEASE HAVE PCP MANAGE GOING FORWARD. 11/01/20   Armbruster,  Lendon Queen, MD  omeprazole  (PRILOSEC) 20 MG capsule TAKE 1 CAPSULE BY MOUTH EVERY DAY 07/20/23   Antonio Baumgarten, NP  Potassium Chloride  ER 20 MEQ TBCR Take 10 tablets by mouth daily.    [provider]  predniSONE  (DELTASONE ) 5 MG tablet Take 5 mg by mouth daily with breakfast.    [provider]  tiZANidine  (ZANAFLEX ) 4 MG tablet Take 1 tablet (4 mg total) by mouth every 6 (six) hours as needed for muscle spasms. 12/08/20   Long, Joshua G, MD      Allergies    Penicillins    Review of Systems   Review of Systems  Neurological:  Positive for dizziness.    Physical Exam Updated Vital Signs BP 110/66   Pulse 98   Temp (!) 102.9 F (39.4 C) (Oral)   Resp (!) 31   Ht 5\' 1"  (1.549 m)   Wt 68 kg   SpO2 97%   BMI 28.34 kg/m  Physical Exam Vitals and nursing note reviewed.  HENT:     Head: Normocephalic and atraumatic.  Eyes:     Pupils: Pupils are equal, round, and reactive to light.  Cardiovascular:     Rate and Rhythm: Normal rate and regular rhythm.  Pulmonary:     Effort: Pulmonary effort is normal.     Comments: Diminished breath sounds at left base with no wheezing rales or rhonchi Abdominal:     Palpations: Abdomen is soft.  Tenderness: There is no abdominal tenderness.  Skin:    General: Skin is warm and dry.  Neurological:     Mental Status: She is alert.  Psychiatric:        Mood and Affect: Mood normal.     ED Results / Procedures / Treatments   Labs (all labs ordered are listed, but only abnormal results are displayed) Labs Reviewed  COMPREHENSIVE METABOLIC PANEL WITH GFR - Abnormal; Notable for the following components:      Result Value   Potassium 3.4 (*)    Glucose, Bld 110 (*)    Calcium  8.1 (*)    Albumin 3.2 (*)    AST 48 (*)    All other components within normal limits  CBC WITH DIFFERENTIAL/PLATELET - Abnormal; Notable for the following components:   WBC 18.6 (*)    Platelets 45 (*)    Neutro Abs 16.7 (*)    Abs  Immature Granulocytes 0.17 (*)    All other components within normal limits  CULTURE, BLOOD (ROUTINE X 2)  CULTURE, BLOOD (ROUTINE X 2)  RESP PANEL BY RT-PCR (RSV, FLU A&B, COVID)  RVPGX2  RESPIRATORY PANEL BY PCR  GASTROINTESTINAL PANEL BY PCR, STOOL (REPLACES STOOL CULTURE)  C DIFFICILE QUICK SCREEN W PCR REFLEX    URINE CULTURE  PROTIME-INR  URINALYSIS, W/ REFLEX TO CULTURE (INFECTION SUSPECTED)  PROCALCITONIN  LEGIONELLA PNEUMOPHILA SEROGP 1 UR AG  STREP PNEUMONIAE URINARY ANTIGEN  COMPREHENSIVE METABOLIC PANEL WITH GFR  CBC  C-REACTIVE PROTEIN  HCV AB W REFLEX TO QUANT PCR  HIV ANTIBODY (ROUTINE TESTING W REFLEX)  TECHNOLOGIST SMEAR REVIEW  DIFFERENTIAL  CBG MONITORING, ED  CBG MONITORING, ED  I-STAT CG4 LACTIC ACID, ED  I-STAT CG4 LACTIC ACID, ED    EKG None  Radiology DG Chest 2 View Result Date: 11/06/2023 CLINICAL DATA:  Suspected sepsis. EXAM: CHEST - 2 VIEW COMPARISON:  Chest radiograph dated 04/18/2023. FINDINGS: Patchy area of airspace opacity in the left lower lobe most consistent with developing infiltrate. Clinical correlation and follow-up to resolution recommended. The right lung is clear. No pleural effusion or pneumothorax. The cardiac silhouette is within limits. No acute osseous pathology. Osteopenia with multilevel degenerative changes and vertebroplasty. IMPRESSION: Left lower lobe infiltrate. Electronically Signed   By: Angus Bark M.D.   On: 11/06/2023 16:37   OCT, Retina - OU - Both Eyes Result Date: 11/05/2023 Right Eye Quality was borderline. Central Foveal Thickness: 291. Progression has been stable. Findings include normal foveal contour, no IRF, no SRF, myopic contour, outer retinal atrophy (Diffusely decreased ellipsoid signal -- greatest non-centrally). Left Eye Quality was borderline. Central Foveal Thickness: 296. Progression has been stable. Findings include normal foveal contour, no IRF, no SRF, myopic contour, outer retinal atrophy  (Diffusely decreased ellipsoid signal -- greatest non-centrally). Notes *Images captured and stored on drive Diagnosis / Impression: NFP; no IRF/SRF OU Myopic contour OU Diffusely decreased ellipsoid signal -- greatest non-centrally OU Clinical management: See below Abbreviations: NFP - Normal foveal profile. CME - cystoid macular edema. PED - pigment epithelial detachment. IRF - intraretinal fluid. SRF - subretinal fluid. EZ - ellipsoid zone. ERM - epiretinal membrane. ORA - outer retinal atrophy. ORT - outer retinal tubulation. SRHM - subretinal hyper-reflective material. IRHM - intraretinal hyper-reflective material    Procedures Procedures    Medications Ordered in ED Medications  pantoprazole  (PROTONIX ) EC tablet 40 mg (has no administration in time range)  tiZANidine  (ZANAFLEX ) tablet 4 mg (has no administration in time range)  enoxaparin  (LOVENOX ) injection 40 mg (has no administration in time range)  0.9 % NaCl with KCl 20 mEq/ L  infusion (has no administration in time range)  levofloxacin (LEVAQUIN) IVPB 750 mg (has no administration in time range)  predniSONE  (DELTASONE ) tablet 10 mg (has no administration in time range)  acetaminophen  (TYLENOL ) tablet 1,000 mg (1,000 mg Oral Given 11/06/23 1511)  sodium chloride  0.9 % bolus 1,000 mL (1,000 mLs Intravenous New Bag/Given 11/06/23 1452)  levofloxacin (LEVAQUIN) IVPB 750 mg (750 mg Intravenous New Bag/Given 11/06/23 1528)  morphine  (PF) 2 MG/ML injection 2 mg (2 mg Intravenous Given 11/06/23 1559)    ED Course/ Medical Decision Making/ A&P Clinical Course as of 11/06/23 1728  Thu Nov 06, 2023  1727 Elevated white count.  New focal consolidation on chest x-ray consistent with pneumonia.  Covered with Levaquin.  Patient remains hemodynamically stable.  Discussed with admitting hospitalist accepts patient for admission [MP]    Clinical Course User Index [MP] Sallyanne Creamer, DO                                 Medical Decision  Making 78 year old female with history as above presenting for 2 days of symptoms including fever weakness near syncope.  Noted to be febrile tachypneic and hypoxic on initial assessment.  Diminished breath sounds at left base.  Presentation concerning for acute infectious process including pneumonia, viral respiratory illness, UTI along with recurrent pleural effusion.  Will obtain laboratory workup chest x-ray UA give Tylenol  for fever and start on antibiotics for empiric coverage of community-acquired pneumonia.  New O2 requirement.  Amount and/or Complexity of Data Reviewed Labs: ordered. Radiology: ordered.  Risk Prescription drug management. Decision regarding hospitalization.           Final Clinical Impression(s) / ED Diagnoses Final diagnoses:  Community acquired pneumonia of left lower lobe of lung    Rx / DC Orders ED Discharge Orders     None         Sallyanne Creamer, DO 11/06/23 1728

## 2023-11-06 NOTE — H&P (Addendum)
 History and Physical    ABAIGEAL Chavez NGE:[id number] DOB: 1945-07-18 DOA: 11/06/2023  PCP: [facility]  Patient coming from: home   Chief Complaint: dizziness  HPI: Jane Chavez is a 78 y.o. female with medical history significant for SLE presents with above complaint.  For the past few days has felt run down and has had several instances of felling lightheaded shortly after standing. Had runny nose and some sneezing a couple of days ago, has also had a few days of loose/watery stool no vomiting or abd pain. No dysuria but does have urinary frequency. No flank pain but does have new mid-thoracic back pain that doesn't radiate. No skin lesions. No headache or neck pain/stiffness. No throat pain. No earache.    Review of Systems: As per HPI otherwise 10 point review of systems negative.    Past Medical History:  Diagnosis Date   Arthritis    RA   Back stiffness 03/13/2017   Pt.'s Dr. thinks it is Lupus related.   Cataract    removed both eyes   Chronic abdominal pain    Diverticulosis    Gallbladder polyp    GERD (gastroesophageal reflux disease)    History of colon polyps    HLD (hyperlipidemia)    Lipoma of stomach    Lupus 2014   Pleural effusion 09/2015    Past Surgical History:  Procedure Laterality Date   BREAST EXCISIONAL BIOPSY Right    CATARACT EXTRACTION, BILATERAL     COLONOSCOPY     ESOPHAGOGASTRODUODENOSCOPY N/A 09/09/2017   Procedure: ESOPHAGOGASTRODUODENOSCOPY (EGD);  Surgeon: Jane Holder, MD;  Location: [facility];  Service: Gastroenterology;  Laterality: N/A;   KYPHOPLASTY N/A 02/08/2021   Procedure: KYPHOPLASTY THORACIC 7, THORACIC 9, THORACIC 10;  Surgeon: Jane Ards, MD;  Location: [facility] OR;  Service: Orthopedics;  Laterality: N/A;   REPLACEMENT TOTAL KNEE Right    TOTAL KNEE ARTHROPLASTY Left 04/24/2020   Procedure: TOTAL KNEE ARTHROPLASTY;  Surgeon: Jane Rei, MD;  Location: [facility];  Service: Orthopedics;   Laterality: Left;    UPPER GASTROINTESTINAL ENDOSCOPY       reports that she has never smoked. She has never used smokeless tobacco. She reports that she does not drink alcohol  and does not use drugs.  Allergies  Allergen Reactions   Penicillins Shortness Of Breath and Palpitations    Has patient had a PCN reaction causing immediate rash, facial/tongue/throat swelling, SOB or lightheadedness with hypotension: Yes Has patient had a PCN reaction causing severe rash involving mucus membranes or skin necrosis: No Has patient had a PCN reaction that required hospitalization No Has patient had a PCN reaction occurring within the last 10 years: No If all of the above answers are "NO", then may proceed with Cephalosporin use.     Family History  Problem Relation Age of Onset   Asthma Mother    Colon cancer Sister    Colon polyps Brother    Other Brother        colon surgery, growth removed   Bladder Cancer Brother    Breast cancer Daughter        double masectomy   Colon cancer Other        niece, deceased 09/01/16  Esophageal cancer Neg Hx    Rectal cancer Neg Hx    Stomach cancer Neg Hx     Prior to Admission medications   Medication Sig Start Date End Date Taking? Authorizing Provider  AMBULATORY NON  FORMULARY MEDICATION Medication Name: Topical diltiazem 2 % and Lidocaine  5% for anal fissure- pea sized amount applied TID until healed 07/19/22   Chavez, Jane Queen, MD  atorvastatin  (LIPITOR) 10 MG tablet Take 1 tablet (10 mg total) by mouth daily. 08/20/23 11/18/23  Jane Perl, MD  azaTHIOprine (IMURAN) 50 MG tablet Take 100 mg by mouth daily.  03/11/17   [provider]  carboxymethylcellul-glycerin (REFRESH OPTIVE) 0.5-0.9 % ophthalmic solution Place 1 drop into both eyes 2 (two) times daily as needed for dry eyes.    [provider]  ergocalciferol (VITAMIN D2) 1.25 MG (50000 UT) capsule 1 capsule. 09/28/23 03/26/24  [provider]  famotidine   (PEPCID ) 20 MG tablet Take 1 tablet (20 mg total) by mouth daily as needed for indigestion or heartburn. 04/10/21   Chavez, Jane Queen, MD  furosemide  (LASIX ) 40 MG tablet Take 40 mg by mouth daily.    [provider]  hydroxychloroquine  (PLAQUENIL ) 200 MG tablet Take 1 tablet (200 mg total) by mouth daily. Patient taking differently: Take 400 mg by mouth daily. 10/05/15   Jane Roach, MD  KLOR-CON  M20 20 MEQ tablet TAKE 1 TABLET (20 MEQ TOTAL) BY MOUTH DAILY. PLEASE HAVE PCP MANAGE GOING FORWARD. 11/01/20   Chavez, Jane Queen, MD  omeprazole  (PRILOSEC) 20 MG capsule TAKE 1 CAPSULE BY MOUTH EVERY DAY 07/20/23   Jane Baumgarten, NP  Potassium Chloride  ER 20 MEQ TBCR Take 10 tablets by mouth daily.    [provider]  predniSONE  (DELTASONE ) 5 MG tablet Take 5 mg by mouth daily with breakfast.    [provider]  tiZANidine  (ZANAFLEX ) 4 MG tablet Take 1 tablet (4 mg total) by mouth every 6 (six) hours as needed for muscle spasms. 12/08/20   Jane Ching, MD    Physical Exam: Vitals:   11/06/23 1357 11/06/23 1406 11/06/23 1515 11/06/23 1630  BP: (!) 169/66  (!) 140/79 110/66  Pulse: 99  79 98  Resp: (!) 32  (!) 21 (!) 31  Temp: (!) 102.9 F (39.4 C)     TempSrc: Oral     SpO2: 93%  (!) 81% 97%  Weight:  68 kg    Height:  5\' 1"  (1.549 m)      Constitutional: No acute distress Head: Atraumatic Eyes: Conjunctiva clear ENM: Moist mucous membranes. Normal dentition.  Neck: Supple Respiratory: scattered rales Cardiovascular: mild tachy, rr Abdomen: Non-tender, non-distended. No masses. No rebound or guarding. Positive bowel sounds. Musculoskeletal: No joint deformity upper and lower extremities. Normal ROM, no contractures. Normal muscle tone. No focal spinous process tenderness, no cva tenderness Skin: No rashes, lesions, or ulcers.  Extremities: No peripheral edema. Palpable peripheral pulses. Neurologic: Alert, moving all 4 extremities. Psychiatric:  Normal insight and judgement.   Labs on Admission: I have personally reviewed following labs and imaging studies  CBC: Recent Labs  Lab 11/06/23 1437  WBC 18.6*  NEUTROABS 16.7*  HGB 14.3  HCT 44.8  MCV 91.8  PLT 45*   Basic Metabolic Panel: Recent Labs  Lab 11/06/23 1437  NA 137  K 3.4*  CL 107  CO2 22  GLUCOSE 110*  BUN 11  CREATININE 0.81  CALCIUM  8.1*   GFR: Estimated Creatinine Clearance: 53 mL/min (by C-G formula based on SCr of 0.81 mg/dL). Liver Function Tests: Recent Labs  Lab 11/06/23 1437  AST 48*  ALT 33  ALKPHOS 63  BILITOT 1.1  PROT 7.5  ALBUMIN 3.2*   No results for  input(s): "LIPASE", "AMYLASE" in the last 168 hours. No results for input(s): "AMMONIA" in the last 168 hours. Coagulation Profile: Recent Labs  Lab 11/06/23 1437  INR 1.1   Cardiac Enzymes: No results for input(s): "CKTOTAL", "CKMB", "CKMBINDEX", "TROPONINI" in the last 168 hours. BNP (last 3 results) No results for input(s): "PROBNP" in the last 8760 hours. HbA1C: No results for input(s): "HGBA1C" in the last 72 hours. CBG: Recent Labs  Lab 11/06/23 1402  GLUCAP 86   Lipid Profile: No results for input(s): "CHOL", "HDL", "LDLCALC", "TRIG", "CHOLHDL", "LDLDIRECT" in the last 72 hours. Thyroid  Function Tests: No results for input(s): "TSH", "T4TOTAL", "FREET4", "T3FREE", "THYROIDAB" in the last 72 hours. Anemia Panel: No results for input(s): "VITAMINB12", "FOLATE", "FERRITIN", "TIBC", "IRON", "RETICCTPCT" in the last 72 hours. Urine analysis:    Component Value Date/Time   COLORURINE AMBER (A) 01/30/2021 1133   APPEARANCEUR CLEAR 01/30/2021 1133   LABSPEC 1.024 01/30/2021 1133   PHURINE 5.0 01/30/2021 1133   GLUCOSEU NEGATIVE 01/30/2021 1133   HGBUR NEGATIVE 01/30/2021 1133   BILIRUBINUR NEGATIVE 01/30/2021 1133   KETONESUR NEGATIVE 01/30/2021 1133   PROTEINUR NEGATIVE 01/30/2021 1133   NITRITE NEGATIVE 01/30/2021 1133   LEUKOCYTESUR NEGATIVE 01/30/2021 1133     Radiological Exams on Admission: DG Chest 2 View Result Date: 11/06/2023 CLINICAL DATA:  Suspected sepsis. EXAM: CHEST - 2 VIEW COMPARISON:  Chest radiograph dated 04/18/2023. FINDINGS: Patchy area of airspace opacity in the left lower lobe most consistent with developing infiltrate. Clinical correlation and follow-up to resolution recommended. The right lung is clear. No pleural effusion or pneumothorax. The cardiac silhouette is within limits. No acute osseous pathology. Osteopenia with multilevel degenerative changes and vertebroplasty. IMPRESSION: Left lower lobe infiltrate. Electronically Signed   By: Angus Bark M.D.   On: 11/06/2023 16:37   OCT, Retina - OU - Both Eyes Result Date: 11/05/2023 Right Eye Quality was borderline. Central Foveal Thickness: 291. Progression has been stable. Findings include normal foveal contour, no IRF, no SRF, myopic contour, outer retinal atrophy (Diffusely decreased ellipsoid signal -- greatest non-centrally). Left Eye Quality was borderline. Central Foveal Thickness: 296. Progression has been stable. Findings include normal foveal contour, no IRF, no SRF, myopic contour, outer retinal atrophy (Diffusely decreased ellipsoid signal -- greatest non-centrally). Notes *Images captured and stored on drive Diagnosis / Impression: NFP; no IRF/SRF OU Myopic contour OU Diffusely decreased ellipsoid signal -- greatest non-centrally OU Clinical management: See below Abbreviations: NFP - Normal foveal profile. CME - cystoid macular edema. PED - pigment epithelial detachment. IRF - intraretinal fluid. SRF - subretinal fluid. EZ - ellipsoid zone. ERM - epiretinal membrane. ORA - outer retinal atrophy. ORT - outer retinal tubulation. SRHM - subretinal hyper-reflective material. IRHM - intraretinal hyper-reflective material    EKG: Independently reviewed. Sinus tach  Assessment/Plan Principal Problem:   CAP (community acquired pneumonia) Active Problems:   SLE  (systemic lupus erythematosus) (HCC)   # Sepsis By fever, leukocytosis. Hemodynamically stable, normal lactate. Probably 2/2 cap but has other symptoms (as below) that could explain sepsis. Is immune compromised (see below) - abx as below - continue fluids - f/u procal  # Hypoxia? EDP said o2 down to 80s but I turned off her o2 and seems to maintain in the mid-90s - monitor  # Orthostasis 2/2 infection - PT consult  # CAP Fever, sepsis, infiltrate on cxr, though interestingly not very symptoms, denies cough but did have runny nose and cough a few days ago - continue levofloxacin - f/u  blood cultures - f/u respiratory panel, urine antigens  # Urinary frequency Unfortunately no ua prior to start of abx. Possible this is a ua. No flank pain or abd pain to suggest pyelo - send urine for ua and culture  # Loose stool/diarrhea For several days, could have gi illness. No abd pain - stool for gi pathogen panel and c diff  # Thoracic back pain Generalized, no focal tenderness - monitor, f/u crp, consider further eval pending response to and findings from above w/u  # Transaminitis Mild, ast 48 - trend for now  # Thrombocytopenia Baseline low 100s, here 45, suspect 2/2 sepsis. SLE may contribute? - trend - f/u hiv, hcv, smear  # SLE - hold home plaquenil  and imuran given infection - cont home prednisone , will increase from 5 to 10, may need stress dose steroids depending on clinical course  DVT prophylaxis: SCDs (2/2 thrombocytopenia) Code Status: dnr/dni confirmed with patient  Family Communication: none at bedside   Consults called: none   Level of care: Med-Surg Status is: Inpatient Remains inpatient appropriate because: severity of illness    Raymonde Calico MD [facility] Pager [phone]  If 7PM-7AM, please contact night-coverage [email or web address] Password [email or web address]  11/06/2023, 5:10 PM

## 2023-11-06 NOTE — ED Provider Triage Note (Signed)
 Emergency Medicine Provider Triage Evaluation Note  Jane Chavez , a 78 y.o. female  was evaluated in triage.  Pt complains of weakness.  For the past 2 days patient states she feels very tired and weak.  She has some congestion but also endorsed having increased urinary frequency and urgency as well as having persistent diarrhea.  No chest pain or shortness of breath no abdominal pain no sore throat.  Review of Systems  Positive: As above Negative: As above  Physical Exam  BP (!) 169/66   Pulse 99   Temp (!) 102.9 F (39.4 C) (Oral)   Resp (!) 32   Ht 5\' 1"  (1.549 m)   Wt 68 kg   SpO2 93%   BMI 28.34 kg/m  Gen:   Awake, no distress   Resp:  Normal effort  MSK:   Moves extremities without difficulty  Other:    Medical Decision Making  Medically screening exam initiated at 2:23 PM.  Appropriate orders placed.  JAYSA KISE was informed that the remainder of the evaluation will be completed by another provider, this initial triage assessment does not replace that evaluation, and the importance of remaining in the ED until their evaluation is complete.  Code sepsis initiated   Debbra Fairy, PA-C 11/06/23 1424

## 2023-11-07 ENCOUNTER — Encounter (INDEPENDENT_AMBULATORY_CARE_PROVIDER_SITE_OTHER): Payer: Self-pay | Admitting: Ophthalmology

## 2023-11-07 DIAGNOSIS — M328 Other forms of systemic lupus erythematosus: Secondary | ICD-10-CM | POA: Diagnosis not present

## 2023-11-07 DIAGNOSIS — N12 Tubulo-interstitial nephritis, not specified as acute or chronic: Secondary | ICD-10-CM | POA: Insufficient documentation

## 2023-11-07 DIAGNOSIS — R9431 Abnormal electrocardiogram [ECG] [EKG]: Secondary | ICD-10-CM | POA: Diagnosis not present

## 2023-11-07 DIAGNOSIS — N3000 Acute cystitis without hematuria: Secondary | ICD-10-CM | POA: Diagnosis not present

## 2023-11-07 DIAGNOSIS — J189 Pneumonia, unspecified organism: Secondary | ICD-10-CM | POA: Diagnosis not present

## 2023-11-07 LAB — COMPREHENSIVE METABOLIC PANEL WITH GFR
ALT: 25 U/L (ref 0–44)
AST: 35 U/L (ref 15–41)
Albumin: 2.4 g/dL — ABNORMAL LOW (ref 3.5–5.0)
Alkaline Phosphatase: 54 U/L (ref 38–126)
Anion gap: 3 — ABNORMAL LOW (ref 5–15)
BUN: 8 mg/dL (ref 8–23)
CO2: 20 mmol/L — ABNORMAL LOW (ref 22–32)
Calcium: 7.5 mg/dL — ABNORMAL LOW (ref 8.9–10.3)
Chloride: 116 mmol/L — ABNORMAL HIGH (ref 98–111)
Creatinine, Ser: 0.8 mg/dL (ref 0.44–1.00)
GFR, Estimated: >60 mL/min (ref >60)
Glucose, Bld: 100 mg/dL — ABNORMAL HIGH (ref 70–99)
Potassium: 3.7 mmol/L (ref 3.5–5.1)
Sodium: 139 mmol/L (ref 135–145)
Total Bilirubin: 1 mg/dL (ref 0.0–1.2)
Total Protein: 6 g/dL — ABNORMAL LOW (ref 6.5–8.1)

## 2023-11-07 LAB — RESPIRATORY PANEL BY PCR

## 2023-11-07 LAB — BLOOD CULTURE ID PANEL (REFLEXED) - BCID2

## 2023-11-07 LAB — CBC
HCT: 39.1 % (ref 36.0–46.0)
Hemoglobin: 12.4 g/dL (ref 12.0–15.0)
MCH: 29.3 pg (ref 26.0–34.0)
MCHC: 31.7 g/dL (ref 30.0–36.0)
MCV: 92.4 fL (ref 80.0–100.0)
Platelets: 24 10*3/uL — CL (ref 150–400)
RBC: 4.23 MIL/uL (ref 3.87–5.11)
RDW: 15.5 % (ref 11.5–15.5)
WBC: 16 10*3/uL — ABNORMAL HIGH (ref 4.0–10.5)
nRBC: 0 % (ref 0.0–0.2)

## 2023-11-07 LAB — MAGNESIUM: Magnesium: 1.9 mg/dL (ref 1.7–2.4)

## 2023-11-07 LAB — URINE CULTURE: Culture: NO GROWTH

## 2023-11-07 LAB — HIV ANTIBODY (ROUTINE TESTING W REFLEX): HIV Screen 4th Generation wRfx: NONREACTIVE

## 2023-11-07 MED ORDER — POTASSIUM CHLORIDE CRYS ER 20 MEQ PO TBCR
40.0000 meq | EXTENDED_RELEASE_TABLET | Freq: Once | ORAL | Status: AC
  Administered 2023-11-07: 40 meq via ORAL
  Filled 2023-11-07: qty 2

## 2023-11-07 MED ORDER — PANTOPRAZOLE SODIUM 40 MG PO TBEC
40.0000 mg | DELAYED_RELEASE_TABLET | Freq: Every day | ORAL | Status: DC
  Administered 2023-11-07 – 2023-11-11 (×5): 40 mg via ORAL
  Filled 2023-11-07 (×5): qty 1

## 2023-11-07 MED ORDER — ACETAMINOPHEN 325 MG PO TABS
650.0000 mg | ORAL_TABLET | Freq: Four times a day (QID) | ORAL | Status: DC | PRN
  Administered 2023-11-07 (×2): 650 mg via ORAL
  Filled 2023-11-07 (×2): qty 2

## 2023-11-07 MED ORDER — ATORVASTATIN CALCIUM 10 MG PO TABS
10.0000 mg | ORAL_TABLET | Freq: Every day | ORAL | Status: DC
  Administered 2023-11-07 – 2023-11-11 (×5): 10 mg via ORAL
  Filled 2023-11-07 (×5): qty 1

## 2023-11-07 MED ORDER — LACTATED RINGERS IV SOLN: INTRAVENOUS | Status: AC

## 2023-11-07 MED ORDER — OXYCODONE HCL 5 MG PO TABS
2.5000 mg | ORAL_TABLET | ORAL | Status: DC | PRN
  Administered 2023-11-07 (×2): 2.5 mg via ORAL
  Filled 2023-11-07 (×2): qty 1

## 2023-11-07 MED ORDER — ACETAMINOPHEN 325 MG PO TABS
650.0000 mg | ORAL_TABLET | Freq: Four times a day (QID) | ORAL | Status: DC | PRN
  Administered 2023-11-08 (×2): 650 mg via ORAL
  Filled 2023-11-07 (×2): qty 2

## 2023-11-07 NOTE — TOC Initial Note (Signed)
 Transition of Care [facility]) - Initial/Assessment Note    Patient Details  Name: Jane Chavez MRN: [id number] Date of Birth: Nov 01, 1945  Transition of Care [facility]) CM/SW Contact:    Tessie Fila, RN Phone Number: 11/07/2023, 3:55 PM  Clinical Narrative:                 NCM met with pt at bedside. Pt denies any SDOH risks at this time. No current HH or DME needs. PT rec HH PT and RW. Discussed recommendations and pt is agreeable for NCM to set up [facility] PT. Pt states that she has a RW at home. NCM spoke with Bartholomew Light with [facility] and she has agreed to offer [facility] PT services at discharge. Pt will need HH orders at DC. TOC following.  Expected Discharge Plan: Home w Home Health Services Barriers to Discharge: Continued Medical Work up   Patient Goals and CMS Choice Patient states their goals for this hospitalization and ongoing recovery are:: To return home CMS Medicare.gov Compare Post Acute Care list provided to:: Patient Choice offered to / list presented to : Patient [place] ownership interest in [facility].provided to:: Patient    Expected Discharge Plan and Services In-house Referral: NA Discharge Planning Services: CM Consult Post Acute Care Choice: Home Health Living arrangements for the past 2 months: Single Family Home                 DME Arranged: N/A DME Agency: NA       HH Arranged: PT HH Agency: [facility] Date HH Agency Contacted: 11/07/23 Time HH Agency Contacted: [phone] Representative spoke with at [facility] Agency: Johnson Nanny [phone]  Prior Living Arrangements/Services Living arrangements for the past 2 months: Single Family Home Lives with:: Adult Children Patient language and need for interpreter reviewed:: Yes Do you feel safe going back to the place where you live?: Yes      Need for Family Participation in Patient Care: No (Comment) Care giver support system in place?: Yes (comment) Current home services: DME, Other  (comment) (Rolling walker) Criminal Activity/Legal Involvement Pertinent to Current Situation/Hospitalization: No - Comment as needed  Activities of Daily Living   ADL Screening (condition at time of admission) Independently performs ADLs?: Yes (appropriate for developmental age) Is the patient deaf or have difficulty hearing?: No Does the patient have difficulty seeing, even when wearing glasses/contacts?: Yes Does the patient have difficulty concentrating, remembering, or making decisions?: No  Permission Sought/Granted Permission sought to share information with : Family Supports Permission granted to share information with : Yes, Verbal Permission Granted  Share Information with NAME: Farris Hong (Daughter)  [phone]           Emotional Assessment Appearance:: Appears older than stated age Attitude/Demeanor/Rapport: Gracious, Engaged Affect (typically observed): Accepting Orientation: : Oriented to Self, Oriented to Place, Oriented to  Time, Oriented to Situation Alcohol  / Substance Use: Not Applicable Psych Involvement: No (comment)  Admission diagnosis:  CAP (community acquired pneumonia) [J18.9] Patient Active Problem List   Diagnosis Date Noted   CAP (community acquired pneumonia) 11/06/2023   Sepsis (HCC) 11/06/2023   OA (osteoarthritis) of knee 04/24/2020   Primary osteoarthritis of left knee 04/24/2020   Abnormal CT of the abdomen    Abdominal pain, epigastric    SOB (shortness of breath)    Acute respiratory failure with hypoxia (HCC) 10/03/2015   HLD (hyperlipidemia) 10/03/2015   Left ventricular diastolic dysfunction, NYHA class 2 10/03/2015   SLE (  systemic lupus erythematosus) (HCC) 10/03/2015   Enlarged thyroid  gland 10/03/2015   Recurrent pleural effusion on left    Bilateral pleural effusion (Recurrent) 09/21/2015   GERD (gastroesophageal reflux disease) 09/21/2015   PCP:  [facility] Pharmacy:   [id number] -  [place], [place] - [address] AT [facility] [address] [place] [address] Phone: [phone] Fax: [phone]     Social Drivers of Health (SDOH) Social History: SDOH Screenings   Food Insecurity: No Food Insecurity (11/06/2023)  Housing: Low Risk  (11/06/2023)  Transportation Needs: No Transportation Needs (11/06/2023)  Utilities: Not At Risk (11/06/2023)  Social Connections: Socially Isolated (11/06/2023)  Tobacco Use: Low Risk  (11/07/2023)   SDOH Interventions:     Readmission Risk Interventions    11/07/2023    3:40 PM  Readmission Risk Prevention Plan  Post Dischage Appt Complete  Medication Screening Complete  Transportation Screening Complete

## 2023-11-07 NOTE — Evaluation (Signed)
 Physical Therapy Evaluation Patient Details Name: Jane Chavez MRN: [id number] DOB: 1946-01-11 Today's Date: 11/07/2023  History of Present Illness  78 y.o. female admitted on 11/06/23 for CAP and sepsis.  Past medical history significant for SLE, bil TKAs  Clinical Impression  Pt admitted with above diagnosis.  Pt currently with functional limitations due to the deficits listed below (see PT Problem List). Pt will benefit from acute skilled PT to increase their independence and safety with mobility to allow discharge.  Pt reports "back" pain at rest however increased with standing/weight bearing so only performed OOB to recliner today.  When questioned further, pt denies any falls prior to admission and points to bilateral iliac crest areas as pain areas.  Pt states her grandson can stay with her upon d/c and physically assist if needed (required min assist today for transfer).         If plan is discharge home, recommend the following: A little help with walking and/or transfers;A little help with bathing/dressing/bathroom;Assistance with cooking/housework;Help with stairs or ramp for entrance   Can travel by private vehicle        Equipment Recommendations Rolling walker (2 wheels)  Recommendations for Other Services       Functional Status Assessment Patient has had a recent decline in their functional status and demonstrates the ability to make significant improvements in function in a reasonable and predictable amount of time.     Precautions / Restrictions Precautions Precautions: Fall      Mobility  Bed Mobility Overal bed mobility: Needs Assistance Bed Mobility: Supine to Sit     Supine to sit: Contact guard, HOB elevated, Used rails     General bed mobility comments: increased time and effort    Transfers Overall transfer level: Needs assistance Equipment used: Rolling walker (2 wheels) Transfers: Sit to/from Stand, Bed to chair/wheelchair/BSC Sit to Stand:  Min assist   Step pivot transfers: Min assist       General transfer comment: pt reports increased "back" but pointing to iliac crests bilaterally with weight bearing, encouraged pt to use UEs through RW, pt requiring min assist for stability as legs buckling with pain    Ambulation/Gait               General Gait Details: pt felt unable due to pain  Stairs            Wheelchair Mobility     Tilt Bed    Modified Rankin (Stroke Patients Only)       Balance Overall balance assessment: Mild deficits observed, not formally tested (denies recent falls)                                           Pertinent Vitals/Pain Pain Assessment Pain Assessment: Faces Faces Pain Scale: Hurts whole lot Pain Location: states back but points to bil pelvis (iliac crests) Pain Descriptors / Indicators: Discomfort, Tender Pain Intervention(s): Repositioned, Monitored during session, Limited activity within patient's tolerance (RN notified of pain, RN reports pt taking tylenol )    Home Living Family/patient expects to be discharged to:: Private residence Living Arrangements: Alone Available Help at Discharge: Family (pt reports her grandson will stay with her at d/c and provide 24/7 assist) Type of Home: House Home Access: Level entry       Home Layout: One level Home Equipment: Agricultural consultant (2 wheels)  Prior Function Prior Level of Function : Independent/Modified Independent             Mobility Comments: reports being independent       Extremity/Trunk Assessment        Lower Extremity Assessment Lower Extremity Assessment: Generalized weakness       Communication   Communication Communication: No apparent difficulties    Cognition Arousal: Alert Behavior During Therapy: WFL for tasks assessed/performed   PT - Cognitive impairments: No apparent impairments                         Following commands: Intact        Cueing       General Comments      Exercises     Assessment/Plan    PT Assessment Patient needs continued PT services  PT Problem List Decreased mobility;Decreased activity tolerance;Decreased strength;Pain;Decreased knowledge of use of DME;Decreased balance       PT Treatment Interventions DME instruction;Gait training;Balance training;Functional mobility training;Therapeutic activities;Therapeutic exercise;Stair training;Patient/family education    PT Goals (Current goals can be found in the Care Plan section)  Acute Rehab PT Goals PT Goal Formulation: With patient Time For Goal Achievement: 11/21/23 Potential to Achieve Goals: Good    Frequency Min 2X/week     Co-evaluation               AM-PAC PT "6 Clicks" Mobility  Outcome Measure Help needed turning from your back to your side while in a flat bed without using bedrails?: A Little Help needed moving from lying on your back to sitting on the side of a flat bed without using bedrails?: A Little Help needed moving to and from a bed to a chair (including a wheelchair)?: A Lot Help needed standing up from a chair using your arms (e.g., wheelchair or bedside chair)?: A Lot Help needed to walk in hospital room?: A Lot Help needed climbing 3-5 steps with a railing? : A Lot 6 Click Score: 14    End of Session Equipment Utilized During Treatment: Gait belt Activity Tolerance: Patient limited by pain Patient left: in chair;with call bell/phone within reach;with chair alarm set Nurse Communication: Mobility status PT Visit Diagnosis: Difficulty in walking, not elsewhere classified (R26.2);Unsteadiness on feet (R26.81)    Time: 0102-7253 PT Time Calculation (min) (ACUTE ONLY): 13 min   Charges:   PT Evaluation $PT Eval Low Complexity: 1 Low   PT General Charges $$ ACUTE PT VISIT: 1 Visit        Henretta Lodge PT, DPT Physical Therapist Acute Rehabilitation Services Office: [phone]   Myna Asal  [place] 11/07/2023, 1:54 PM

## 2023-11-07 NOTE — Assessment & Plan Note (Signed)
 Patient presenting with multiple SIRS criteria as well as multiple sources of infection concerning for sepsis Sources of infection including left lower lobe pneumonia and urinary tract infection Blood and urine cultures pending Continuing intravenous levofloxacin  due to known history of penicillin allergy Hydrating with intravenous isotonic fluids Due to ongoing complaints of right flank pain will additionally obtain CT imaging of abdomen and pelvis to identify for any evidence of obstructive renal stone or pyelonephritis.

## 2023-11-07 NOTE — Assessment & Plan Note (Signed)
See assessment and plan above

## 2023-11-07 NOTE — Assessment & Plan Note (Signed)
 Monitoring electrolytes and replacing as necessary Monitoring on telemetry Minimize use of QT prolonging agents

## 2023-11-07 NOTE — Assessment & Plan Note (Signed)
 Continue home regimen of hydroxychloroquine  Was on Imuran in the outpatient setting leading to an immunocompromised state

## 2023-11-07 NOTE — Plan of Care (Signed)

## 2023-11-07 NOTE — Hospital Course (Addendum)
 78 year old female with past medical history of SLE , hyperlipidemia, gastroesophageal reflux disease presenting with complaints of dizziness.  Upon evaluation in the emergency department  found to have multiple SIRS criteria including substantial leukocytosis fever and tachycardia with concerns for sepsis secondary to pneumonia.  The hospitalist group was then called to assess the patient for admission the hospital.  Patient was initiated on aggressive intravenous volume resuscitation and intravenous antibiotic therapy.  Patient was treated with intravenous levofloxacin  due to known history of penicillin allergy.  Patient was initially placed on supplemental oxygen for hypoxia but this was quickly weaned.  Complaints of severe left flank pain early in the hospitalization prompted CT imaging of the abdomen and pelvis which revealed possible early left-sided pyelonephritis as well as redemonstration of the patient's substantial left lower lobe pneumonia and an additional less severe right lower lobe pneumonia.  Hospitalization has been complicated by significant thrombocytopenia.  Thrombocytopenia is thought to be secondary to infection and as patient has clinically improved thrombocytopenia has slowly also improved.   During the possible course the patient was incidentally found to be going in and out of atrial fibrillation with rapid ventricular response.  Patient has been suffering from bouts of palpitations and shortness of breath for at least the past several months and has even recently been seen by cardiology for these complaints on 08/20/2023.  It is likely the patient has been suffering from paroxysmal atrial fibrillation this entire time.  Patient was initiated on low-dose metoprolol  twice daily.  Patient also has a CHADSVASC score of 3 prompting consideration of anticoagulation however due to patient's thrombocytopenia this will be postponed until platelet count recovers.

## 2023-11-07 NOTE — Progress Notes (Signed)
 PHARMACY - PHYSICIAN COMMUNICATION CRITICAL VALUE ALERT - BLOOD CULTURE IDENTIFICATION (BCID)  Jane Chavez is an 78 y.o. female who presented to [facility] on 11/06/2023 with a chief complaint of  Chief Complaint  Patient presents with   Dizziness     Assessment:  BCx 1/2 bottles staph species   Name of physician (or Provider) Contacted: Dr. Lanette Pipe  Current antibiotics: levofloxacin  Changes to prescribed antibiotics recommended:  - No changes, continue levofloxacin   Results for orders placed or performed during the hospital encounter of 11/06/23  Blood Culture ID Panel (Reflexed) (Collected: 11/06/2023  2:37 PM)  Result Value Ref Range   Enterococcus faecalis NOT DETECTED NOT DETECTED   Enterococcus Faecium NOT DETECTED NOT DETECTED   Listeria monocytogenes NOT DETECTED NOT DETECTED   Staphylococcus species DETECTED (A) NOT DETECTED   Staphylococcus aureus (BCID) NOT DETECTED NOT DETECTED   Staphylococcus epidermidis NOT DETECTED NOT DETECTED   Staphylococcus lugdunensis NOT DETECTED NOT DETECTED   Streptococcus species NOT DETECTED NOT DETECTED   Streptococcus agalactiae NOT DETECTED NOT DETECTED   Streptococcus pneumoniae NOT DETECTED NOT DETECTED   Streptococcus pyogenes NOT DETECTED NOT DETECTED   A.calcoaceticus-baumannii NOT DETECTED NOT DETECTED   Bacteroides fragilis NOT DETECTED NOT DETECTED   Enterobacterales NOT DETECTED NOT DETECTED   Enterobacter cloacae complex NOT DETECTED NOT DETECTED   Escherichia coli NOT DETECTED NOT DETECTED   Klebsiella aerogenes NOT DETECTED NOT DETECTED   Klebsiella oxytoca NOT DETECTED NOT DETECTED   Klebsiella pneumoniae NOT DETECTED NOT DETECTED   Proteus species NOT DETECTED NOT DETECTED   Salmonella species NOT DETECTED NOT DETECTED   Serratia marcescens NOT DETECTED NOT DETECTED   Haemophilus influenzae NOT DETECTED NOT DETECTED   Neisseria meningitidis NOT DETECTED NOT DETECTED   Pseudomonas aeruginosa NOT DETECTED  NOT DETECTED   Stenotrophomonas maltophilia NOT DETECTED NOT DETECTED   Candida albicans NOT DETECTED NOT DETECTED   Candida auris NOT DETECTED NOT DETECTED   Candida glabrata NOT DETECTED NOT DETECTED   Candida krusei NOT DETECTED NOT DETECTED   Candida parapsilosis NOT DETECTED NOT DETECTED   Candida tropicalis NOT DETECTED NOT DETECTED   Cryptococcus neoformans/gattii NOT DETECTED NOT DETECTED     Van Gelinas, PharmD, BCPS 11/07/2023 7:14 PM

## 2023-11-07 NOTE — Progress Notes (Signed)
 PROGRESS NOTE   Jane Chavez  ZOX:[id number] DOB: [birth year] DOA: 11/06/2023 PCP: [facility]   Date of Service: the patient was seen and examined on 11/07/2023  Brief Narrative:  78 year old female with past medical history of SLE , hyperlipidemia, gastroesophageal reflux disease presenting with complaints of dizziness.  Upon evaluation in the emergency department patient was found to be exhibiting multiple SIRS criteria found to have multiple SIRS criteria including substantial leukocytosis fever and tachycardia with concerns for sepsis secondary to a left lower lobe pneumonia.  The hospitalist group was then called to assess the patient for admission the hospital.  Patient was initiated on aggressive intravenous volume resuscitation and intravenous antibiotic therapy.  Patient was initially placed on supplemental oxygen for hypoxia but this was quickly weaned.   Assessment & Plan Sepsis [facility]) Patient presenting with multiple SIRS criteria as well as multiple sources of infection concerning for sepsis Sources of infection including left lower lobe pneumonia and urinary tract infection Blood and urine cultures pending Continuing intravenous levofloxacin due to known history of penicillin allergy Hydrating with intravenous isotonic fluids Due to ongoing complaints of right flank pain will additionally obtain CT imaging of abdomen and pelvis to identify for any evidence of obstructive renal stone or pyelonephritis. Pneumonia of left lower lobe due to infectious organism See assessment and plan above Acute cystitis without hematuria See assessment and plan above SLE (systemic lupus erythematosus) (HCC) Continue home regimen of hydroxychloroquine  Was on Imuran in the outpatient setting leading to an immunocompromised state Prolonged QT interval Monitoring electrolytes and replacing as necessary Monitoring on telemetry Minimize use of QT prolonging agents      Subjective:  Patient is complaining of right flank pain, moderate to severe in intensity, sharp in quality, nonradiating, worse with movement.  Physical Exam:  Vitals:   11/06/23 2317 11/07/23 0030 11/07/23 0507 11/07/23 0813  BP: 117/67 108/60 129/76 (!) 112/57  Pulse: 97 96 87 92  Resp: 17 18 18 17   Temp: (!) 102.7 F (39.3 C) (!) 100.4 F (38 C) 99.7 F (37.6 C) 99.9 F (37.7 C)  TempSrc:    Oral  SpO2: 95% 92% 94% 95%  Weight:      Height:        Constitutional: Awake alert and oriented x3, no associated distress.   Skin: no rashes, no lesions, good skin turgor noted. Eyes: Pupils are equally reactive to light.  No evidence of scleral icterus or conjunctival pallor.  ENMT: Moist mucous membranes noted.  Posterior pharynx clear of any exudate or lesions.   Respiratory: Intermittent expiratory wheezing with mild bibasilar rales.  Normal respiratory effort. No accessory muscle use.  Cardiovascular: Regular rate and rhythm, no murmurs / rubs / gallops. No extremity edema. 2+ pedal pulses. No carotid bruits.  Abdomen: Abdomen is soft and nontender.  No evidence of intra-abdominal masses.  Positive bowel sounds noted in all quadrants.   Musculoskeletal: No joint deformity upper and lower extremities. Good ROM, no contractures. Normal muscle tone.  Back: Notable tenderness at thoracic spine and left flank without deformity   Data Reviewed:  I have personally reviewed and interpreted labs, imaging.  Significant findings are   CBC: Recent Labs  Lab 11/06/23 1437 11/07/23 0517  WBC 18.6* 16.0*  NEUTROABS 16.7*  --   HGB 14.3 12.4  HCT 44.8 39.1  MCV 91.8 92.4  PLT 45* 24*   Basic Metabolic Panel: Recent Labs  Lab 11/06/23 1437 11/07/23 0517  NA 137 139  K 3.4* 3.7  CL 107 116*  CO2 22 20*  GLUCOSE 110* 100*  BUN 11 8  CREATININE 0.81 0.80  CALCIUM  8.1* 7.5*   GFR: Estimated Creatinine Clearance: 53.6 mL/min (by C-G formula based on SCr of 0.8  mg/dL). Liver Function Tests: Recent Labs  Lab 11/06/23 1437 11/07/23 0517  AST 48* 35  ALT 33 25  ALKPHOS 63 54  BILITOT 1.1 1.0  PROT 7.5 6.0*  ALBUMIN 3.2* 2.4*    Coagulation Profile: Recent Labs  Lab 11/06/23 1437  INR 1.1     EKG: Personally reviewed.  Rhythm is sinus tachycardia with heart rate of 99 bpm.  QTc of 507 ms   Code Status:  DNR.  Code status decision has been confirmed with: patient Family Communication: Son is at the bedside and has been updated on plan of care.   Severity of Illness:  The appropriate patient status for this patient is INPATIENT. Inpatient status is judged to be reasonable and necessary in order to provide the required intensity of service to ensure the patient's safety. The patient's presenting symptoms, physical exam findings, and initial radiographic and laboratory data in the context of their chronic comorbidities is felt to place them at high risk for further clinical deterioration. Furthermore, it is not anticipated that the patient will be medically stable for discharge from the hospital within 2 midnights of admission.   * I certify that at the point of admission it is my clinical judgment that the patient will require inpatient hospital care spanning beyond 2 midnights from the point of admission due to high intensity of service, high risk for further deterioration and high frequency of surveillance required.*  Time spent:  57 minutes  Author:  True Fuss MD  11/07/2023 11:53 AM

## 2023-11-08 ENCOUNTER — Inpatient Hospital Stay (HOSPITAL_COMMUNITY)

## 2023-11-08 DIAGNOSIS — J189 Pneumonia, unspecified organism: Secondary | ICD-10-CM | POA: Diagnosis not present

## 2023-11-08 DIAGNOSIS — N3000 Acute cystitis without hematuria: Secondary | ICD-10-CM | POA: Diagnosis not present

## 2023-11-08 DIAGNOSIS — M328 Other forms of systemic lupus erythematosus: Secondary | ICD-10-CM | POA: Diagnosis not present

## 2023-11-08 DIAGNOSIS — R109 Unspecified abdominal pain: Secondary | ICD-10-CM | POA: Insufficient documentation

## 2023-11-08 DIAGNOSIS — R9431 Abnormal electrocardiogram [ECG] [EKG]: Secondary | ICD-10-CM | POA: Diagnosis not present

## 2023-11-08 LAB — TECHNOLOGIST SMEAR REVIEW: Plt Morphology: NORMAL

## 2023-11-08 LAB — COMPREHENSIVE METABOLIC PANEL WITH GFR
ALT: 25 U/L (ref 0–44)
AST: 32 U/L (ref 15–41)
Albumin: 2.3 g/dL — ABNORMAL LOW (ref 3.5–5.0)
Alkaline Phosphatase: 59 U/L (ref 38–126)
Anion gap: 4 — ABNORMAL LOW (ref 5–15)
BUN: 11 mg/dL (ref 8–23)
CO2: 22 mmol/L (ref 22–32)
Calcium: 7.9 mg/dL — ABNORMAL LOW (ref 8.9–10.3)
Chloride: 110 mmol/L (ref 98–111)
Creatinine, Ser: 0.7 mg/dL (ref 0.44–1.00)
GFR, Estimated: >60 mL/min (ref >60)
Glucose, Bld: 88 mg/dL (ref 70–99)
Potassium: 3.9 mmol/L (ref 3.5–5.1)
Sodium: 136 mmol/L (ref 135–145)
Total Bilirubin: 0.8 mg/dL (ref 0.0–1.2)
Total Protein: 5.9 g/dL — ABNORMAL LOW (ref 6.5–8.1)

## 2023-11-08 LAB — PROTIME-INR
INR: 1 (ref 0.8–1.2)
Prothrombin Time: 13.1 s (ref 11.4–15.2)

## 2023-11-08 LAB — CBC WITH DIFFERENTIAL/PLATELET
Abs Immature Granulocytes: 0.03 10*3/uL (ref 0.00–0.07)
Basophils Absolute: 0 10*3/uL (ref 0.0–0.1)
Basophils Relative: 0 %
Eosinophils Absolute: 0.1 10*3/uL (ref 0.0–0.5)
Eosinophils Relative: 1 %
HCT: 39.9 % (ref 36.0–46.0)
Hemoglobin: 12.4 g/dL (ref 12.0–15.0)
Immature Granulocytes: 0 %
Lymphocytes Relative: 12 %
Lymphs Abs: 1.5 10*3/uL (ref 0.7–4.0)
MCH: 29.2 pg (ref 26.0–34.0)
MCHC: 31.1 g/dL (ref 30.0–36.0)
MCV: 94.1 fL (ref 80.0–100.0)
Monocytes Absolute: 1.3 10*3/uL — ABNORMAL HIGH (ref 0.1–1.0)
Monocytes Relative: 10 %
Neutro Abs: 9.4 10*3/uL — ABNORMAL HIGH (ref 1.7–7.7)
Neutrophils Relative %: 77 %
Platelets: 27 10*3/uL — CL (ref 150–400)
RBC: 4.24 MIL/uL (ref 3.87–5.11)
RDW: 15.7 % — ABNORMAL HIGH (ref 11.5–15.5)
WBC: 12.4 10*3/uL — ABNORMAL HIGH (ref 4.0–10.5)
nRBC: 0.2 % (ref 0.0–0.2)

## 2023-11-08 LAB — HCV INTERPRETATION

## 2023-11-08 LAB — HCV AB W REFLEX TO QUANT PCR: HCV Ab: NONREACTIVE

## 2023-11-08 LAB — APTT: aPTT: 35 s (ref 24–36)

## 2023-11-08 LAB — MAGNESIUM: Magnesium: 1.9 mg/dL (ref 1.7–2.4)

## 2023-11-08 MED ORDER — IOHEXOL 300 MG/ML  SOLN
100.0000 mL | Freq: Once | INTRAMUSCULAR | Status: AC | PRN
  Administered 2023-11-08: 80 mL via INTRAVENOUS

## 2023-11-08 MED ORDER — OXYCODONE HCL 5 MG PO TABS
5.0000 mg | ORAL_TABLET | ORAL | Status: DC | PRN
  Administered 2023-11-08 – 2023-11-11 (×5): 5 mg via ORAL
  Filled 2023-11-08 (×5): qty 1

## 2023-11-08 MED ORDER — LACTATED RINGERS IV SOLN: INTRAVENOUS | Status: AC

## 2023-11-08 MED ORDER — OXYCODONE HCL 5 MG PO TABS: 2.5000 mg | ORAL_TABLET | ORAL | Status: DC | PRN

## 2023-11-08 MED ORDER — IOHEXOL 9 MG/ML PO SOLN
500.0000 mL | ORAL | Status: AC
  Administered 2023-11-08 (×2): 500 mL via ORAL

## 2023-11-08 NOTE — Progress Notes (Signed)
 PROGRESS NOTE   Jane Chavez  ZOX:[id number] DOB: [birth year] DOA: 11/06/2023 PCP: [facility]   Date of Service: the patient was seen and examined on 11/08/2023  Brief Narrative:  78 year old female with past medical history of SLE , hyperlipidemia, gastroesophageal reflux disease presenting with complaints of dizziness.  Upon evaluation in the emergency department patient was found to be exhibiting multiple SIRS criteria found to have multiple SIRS criteria including substantial leukocytosis fever and tachycardia with concerns for sepsis secondary to a left lower lobe pneumonia.  The hospitalist group was then called to assess the patient for admission the hospital.  Patient was initiated on aggressive intravenous volume resuscitation and intravenous antibiotic therapy.  Patient was initially placed on supplemental oxygen for hypoxia but this was quickly weaned.   Assessment & Plan Sepsis (HCC) Clinically improving with improving fever curve and leukocytosis.   Patient presenting with multiple SIRS criteria as well as multiple sources of infection concerning for sepsis including bilateral lower lobe pneumonia and urinary tract infection with pyelonephritis.  CT imaging abdomen pelvis performed reveals bilateral lower lobe pneumonia but is quite dense in the left lower lobe in particular. CT imaging also reveals what appears to be an early left-sided pyelonephritis although surprisingly urine culture has revealed no growth. Blood cultures pending Continuing intravenous levofloxacin  initiated on admission due to known history of penicillin allergy  Pneumonia of left lower lobe due to infectious organism See assessment and plan above Pyelonephritis of left kidney See assessment and plan above SLE (systemic lupus erythematosus) (HCC) Continue home regimen of hydroxychloroquine  Was on Imuran in the outpatient setting leading to an immunocompromised state Prolonged QT  interval Electrolyte abnormalities corrected, continuing to monitor. Monitoring on telemetry Minimizing use of QT prolonging agents, will need to be cautious since patient is currently on a fluoroquinolone. Obtaining serial EKGs Acute left flank pain Patient suffering from substantial left flank pain likely secondary to pleuritic pain from dense left lower lobe pneumonia in addition to early pyelonephritis. As needed opiate-based analgesics for substantial pain.     Subjective:  Patient is continuing to complain of severe left flank pain sharp in quality, nonradiating, worse with movement.  Physical Exam:  Vitals:   11/07/23 1607 11/07/23 1851 11/07/23 1956 11/08/23 0328  BP: 118/75  (!) 114/42 128/65  Pulse: 81  93 83  Resp: 18  18 17   Temp: 99.1 F (37.3 C)  99.1 F (37.3 C) 98.5 F (36.9 C)  TempSrc:      SpO2: 97% 97% 93% 92%  Weight:      Height:        Constitutional: Awake alert and oriented x3, no associated distress.   Skin: no rashes, no lesions, good skin turgor noted. Eyes: Pupils are equally reactive to light.  No evidence of scleral icterus or conjunctival pallor.  ENMT: Moist mucous membranes noted.  Posterior pharynx clear of any exudate or lesions.   Respiratory: Intermittent expiratory wheezing with mild bibasilar rales.  Normal respiratory effort. No accessory muscle use.  Cardiovascular: Regular rate and rhythm, no murmurs / rubs / gallops. No extremity edema. 2+ pedal pulses. No carotid bruits.  Abdomen: Some left-sided abdominal pain noted.  No evidence of intra-abdominal masses.  Positive bowel sounds noted in all quadrants.   Musculoskeletal: No joint deformity upper and lower extremities. Good ROM, no contractures. Normal muscle tone.  Back: Notable tenderness at thoracic spine and left flank without deformity   Data Reviewed:  I have personally reviewed and  interpreted labs, imaging.  Significant findings are   CBC: Recent Labs  Lab  11/06/23 1437 11/07/23 0517 11/08/23 0640  WBC 18.6* 16.0* 12.4*  NEUTROABS 16.7*  --  9.4*  HGB 14.3 12.4 12.4  HCT 44.8 39.1 39.9  MCV 91.8 92.4 94.1  PLT 45* 24* 27*   Basic Metabolic Panel: Recent Labs  Lab 11/06/23 1437 11/07/23 0517 11/08/23 0640  NA 137 139 136  K 3.4* 3.7 3.9  CL 107 116* 110  CO2 22 20* 22  GLUCOSE 110* 100* 88  BUN 11 8 11   CREATININE 0.81 0.80 0.70  CALCIUM  8.1* 7.5* 7.9*  MG  --  1.9 1.9   GFR: Estimated Creatinine Clearance: 53.6 mL/min (by C-G formula based on SCr of 0.7 mg/dL). Liver Function Tests: Recent Labs  Lab 11/06/23 1437 11/07/23 0517 11/08/23 0640  AST 48* 35 32  ALT 33 25 25  ALKPHOS 63 54 59  BILITOT 1.1 1.0 0.8  PROT 7.5 6.0* 5.9*  ALBUMIN 3.2* 2.4* 2.3*    Coagulation Profile: Recent Labs  Lab 11/06/23 1437  INR 1.1     EKG: Personally reviewed.  Rhythm is sinus tachycardia with heart rate of 99 bpm.  QTc of 507 ms   Code Status:  DNR.  Code status decision has been confirmed with: patient Family Communication: Son is at the bedside and has been updated on plan of care.   Severity of Illness:  The appropriate patient status for this patient is INPATIENT. Inpatient status is judged to be reasonable and necessary in order to provide the required intensity of service to ensure the patient's safety. The patient's presenting symptoms, physical exam findings, and initial radiographic and laboratory data in the context of their chronic comorbidities is felt to place them at high risk for further clinical deterioration. Furthermore, it is not anticipated that the patient will be medically stable for discharge from the hospital within 2 midnights of admission.   * I certify that at the point of admission it is my clinical judgment that the patient will require inpatient hospital care spanning beyond 2 midnights from the point of admission due to high intensity of service, high risk for further deterioration and high  frequency of surveillance required.*  Time spent:  50 minutes  Author:  True Fuss MD  11/08/2023 10:37 AM

## 2023-11-08 NOTE — Assessment & Plan Note (Signed)
See assessment and plan above

## 2023-11-08 NOTE — Assessment & Plan Note (Addendum)
 Clinically improving with resolved fevers, improving leukocytosis and improving procalcitonin. Multiple sources of infection concerning for sepsis including bilateral lower lobe pneumonia (left worse than right) and urinary tract infection with early pyelonephritis.  CT imaging abdomen pelvis performed reveals bilateral lower lobe pneumonia but is quite dense in the left lower lobe in particular. CT imaging also reveals what appears to be an early left-sided pyelonephritis although surprisingly urine culture has revealed no growth. Blood cultures pending.  Urine culture surprisingly is without growth. Continuing intravenous levofloxacin  initiated on admission due to known history of penicillin allergy

## 2023-11-08 NOTE — Assessment & Plan Note (Signed)
 Patient suffering from substantial left flank pain likely secondary to pleuritic pain from dense left lower lobe pneumonia in addition to early pyelonephritis. As needed opiate-based analgesics for substantial pain.

## 2023-11-08 NOTE — Plan of Care (Signed)
  Problem: Education: Goal: Knowledge of General Education information will improve Description: Including pain rating scale, medication(s)/side effects and non-pharmacologic comfort measures Outcome: Progressing   Problem: Nutrition: Goal: Adequate nutrition will be maintained Outcome: Progressing   Problem: Elimination: Goal: Will not experience complications related to bowel motility Outcome: Progressing Goal: Will not experience complications related to urinary retention Outcome: Progressing   Problem: Pain Managment: Goal: General experience of comfort will improve and/or be controlled Outcome: Progressing   Problem: Safety: Goal: Ability to remain free from injury will improve Outcome: Progressing

## 2023-11-08 NOTE — Assessment & Plan Note (Addendum)
 Electrolyte abnormalities corrected, continuing to monitor. Monitoring on telemetry Minimizing use of QT prolonging agents, will need to be cautious since patient is currently on a fluoroquinolone. Obtaining serial EKGs

## 2023-11-08 NOTE — Plan of Care (Signed)

## 2023-11-08 NOTE — Assessment & Plan Note (Signed)
 Continue home regimen of hydroxychloroquine  Was on Imuran in the outpatient setting leading to an immunocompromised state

## 2023-11-09 DIAGNOSIS — M328 Other forms of systemic lupus erythematosus: Secondary | ICD-10-CM | POA: Diagnosis not present

## 2023-11-09 DIAGNOSIS — R9431 Abnormal electrocardiogram [ECG] [EKG]: Secondary | ICD-10-CM | POA: Diagnosis not present

## 2023-11-09 DIAGNOSIS — J189 Pneumonia, unspecified organism: Secondary | ICD-10-CM | POA: Diagnosis not present

## 2023-11-09 DIAGNOSIS — N3000 Acute cystitis without hematuria: Secondary | ICD-10-CM | POA: Diagnosis not present

## 2023-11-09 LAB — COMPREHENSIVE METABOLIC PANEL WITH GFR
ALT: 32 U/L (ref 0–44)
AST: 38 U/L (ref 15–41)
Albumin: 2.3 g/dL — ABNORMAL LOW (ref 3.5–5.0)
Alkaline Phosphatase: 85 U/L (ref 38–126)
Anion gap: 5 (ref 5–15)
BUN: 11 mg/dL (ref 8–23)
CO2: 23 mmol/L (ref 22–32)
Calcium: 8 mg/dL — ABNORMAL LOW (ref 8.9–10.3)
Chloride: 107 mmol/L (ref 98–111)
Creatinine, Ser: 0.72 mg/dL (ref 0.44–1.00)
GFR, Estimated: >60 mL/min (ref >60)
Glucose, Bld: 86 mg/dL (ref 70–99)
Potassium: 3.6 mmol/L (ref 3.5–5.1)
Sodium: 135 mmol/L (ref 135–145)
Total Bilirubin: 0.8 mg/dL (ref 0.0–1.2)
Total Protein: 6.1 g/dL — ABNORMAL LOW (ref 6.5–8.1)

## 2023-11-09 LAB — CBC WITH DIFFERENTIAL/PLATELET
Abs Immature Granulocytes: 0.07 10*3/uL (ref 0.00–0.07)
Basophils Absolute: 0 10*3/uL (ref 0.0–0.1)
Basophils Relative: 0 %
Eosinophils Absolute: 0.1 10*3/uL (ref 0.0–0.5)
Eosinophils Relative: 1 %
HCT: 42.1 % (ref 36.0–46.0)
Hemoglobin: 13.4 g/dL (ref 12.0–15.0)
Immature Granulocytes: 1 %
Lymphocytes Relative: 16 %
Lymphs Abs: 1.8 10*3/uL (ref 0.7–4.0)
MCH: 29 pg (ref 26.0–34.0)
MCHC: 31.8 g/dL (ref 30.0–36.0)
MCV: 91.1 fL (ref 80.0–100.0)
Monocytes Absolute: 1.1 10*3/uL — ABNORMAL HIGH (ref 0.1–1.0)
Monocytes Relative: 10 %
Neutro Abs: 8.3 10*3/uL — ABNORMAL HIGH (ref 1.7–7.7)
Neutrophils Relative %: 72 %
Platelets: 37 10*3/uL — ABNORMAL LOW (ref 150–400)
RBC: 4.62 MIL/uL (ref 3.87–5.11)
RDW: 15.6 % — ABNORMAL HIGH (ref 11.5–15.5)
WBC: 11.4 10*3/uL — ABNORMAL HIGH (ref 4.0–10.5)
nRBC: 0.4 % — ABNORMAL HIGH (ref 0.0–0.2)

## 2023-11-09 LAB — PROCALCITONIN: Procalcitonin: 0.7 ng/mL

## 2023-11-09 LAB — CULTURE, BLOOD (ROUTINE X 2)

## 2023-11-09 LAB — MAGNESIUM: Magnesium: 1.8 mg/dL (ref 1.7–2.4)

## 2023-11-09 MED ORDER — HYDROXYCHLOROQUINE SULFATE 200 MG PO TABS
400.0000 mg | ORAL_TABLET | Freq: Every day | ORAL | Status: DC
  Administered 2023-11-09 – 2023-11-11 (×3): 400 mg via ORAL
  Filled 2023-11-09 (×3): qty 2

## 2023-11-09 MED ORDER — MAGNESIUM SULFATE 2 GM/50ML IV SOLN
2.0000 g | Freq: Once | INTRAVENOUS | Status: AC
  Administered 2023-11-09: 2 g via INTRAVENOUS
  Filled 2023-11-09: qty 50

## 2023-11-09 MED ORDER — METOPROLOL TARTRATE 12.5 MG HALF TABLET
12.5000 mg | ORAL_TABLET | Freq: Two times a day (BID) | ORAL | Status: DC
  Administered 2023-11-09 – 2023-11-11 (×5): 12.5 mg via ORAL
  Filled 2023-11-09 (×5): qty 1

## 2023-11-09 MED ORDER — SENNA 8.6 MG PO TABS
2.0000 | ORAL_TABLET | Freq: Every day | ORAL | Status: DC
  Administered 2023-11-09 – 2023-11-10 (×2): 17.2 mg via ORAL
  Filled 2023-11-09 (×2): qty 2

## 2023-11-09 MED ORDER — POTASSIUM CHLORIDE CRYS ER 20 MEQ PO TBCR
40.0000 meq | EXTENDED_RELEASE_TABLET | Freq: Once | ORAL | Status: AC
  Administered 2023-11-09: 40 meq via ORAL
  Filled 2023-11-09: qty 2

## 2023-11-09 MED ORDER — POLYETHYLENE GLYCOL 3350 17 G PO PACK: 17.0000 g | PACK | Freq: Every day | ORAL | Status: DC | PRN

## 2023-11-09 MED ORDER — POLYETHYLENE GLYCOL 3350 17 G PO PACK: 17.0000 g | PACK | Freq: Once | ORAL | Status: DC

## 2023-11-09 NOTE — Assessment & Plan Note (Signed)
 Continue home regimen of hydroxychloroquine  Was on Imuran in the outpatient setting leading to an immunocompromised state

## 2023-11-09 NOTE — Plan of Care (Signed)

## 2023-11-09 NOTE — Assessment & Plan Note (Signed)
See assessment and plan above

## 2023-11-09 NOTE — Plan of Care (Signed)
  Problem: Education: Goal: Knowledge of General Education information will improve Description Including pain rating scale, medication(s)/side effects and non-pharmacologic comfort measures Outcome: Progressing   Problem: Clinical Measurements: Goal: Ability to maintain clinical measurements within normal limits will improve Outcome: Progressing   Problem: Clinical Measurements: Goal: Will remain free from infection Outcome: Progressing   Problem: Activity: Goal: Risk for activity intolerance will decrease Outcome: Progressing   Problem: Safety: Goal: Ability to remain free from injury will improve Outcome: Progressing   

## 2023-11-09 NOTE — Assessment & Plan Note (Signed)
 Electrolyte abnormalities corrected, continuing to monitor. Monitoring on telemetry Minimizing use of QT prolonging agents, will need to be cautious since patient is currently on a fluoroquinolone. Obtaining serial EKGs

## 2023-11-09 NOTE — Assessment & Plan Note (Signed)
 Patient suffering from substantial left flank pain likely secondary to pleuritic pain from dense left lower lobe pneumonia in addition to early pyelonephritis. As needed opiate-based analgesics for substantial pain.

## 2023-11-09 NOTE — Assessment & Plan Note (Signed)
 Clinically improving with resolved fevers, improving leukocytosis and improving procalcitonin. Multiple sources of infection concerning for sepsis including bilateral lower lobe pneumonia (left worse than right) and urinary tract infection with early pyelonephritis.  CT imaging abdomen pelvis performed reveals bilateral lower lobe pneumonia but is quite dense in the left lower lobe in particular. CT imaging also reveals what appears to be an early left-sided pyelonephritis although surprisingly urine culture has revealed no growth. Blood cultures pending.  Urine culture surprisingly is without growth. Continuing intravenous levofloxacin  initiated on admission due to known history of penicillin allergy

## 2023-11-09 NOTE — Progress Notes (Signed)
 PROGRESS NOTE   Jane Chavez  ZOX:[id number] DOB: [birth year] DOA: 11/06/2023 PCP: [facility]   Date of Service: the patient was seen and examined on 11/09/2023  Brief Narrative:  78 year old female with past medical history of SLE , hyperlipidemia, gastroesophageal reflux disease presenting with complaints of dizziness.  Upon evaluation in the emergency department  found to have multiple SIRS criteria including substantial leukocytosis fever and tachycardia with concerns for sepsis secondary to pneumonia.  The hospitalist group was then called to assess the patient for admission the hospital.  Patient was initiated on aggressive intravenous volume resuscitation and intravenous antibiotic therapy.  Patient was treated with intravenous levofloxacin  due to known history of penicillin allergy.  Patient was initially placed on supplemental oxygen for hypoxia but this was quickly weaned.  Complaints of severe left flank pain early in the hospitalization prompted CT imaging of the abdomen and pelvis which revealed possible early left-sided pyelonephritis as well as redemonstration of the patient's substantial left lower lobe pneumonia and an additional less severe right lower lobe pneumonia.  Hospitalization has been complicated by significant thrombocytopenia.  Thrombocytopenia is thought to be secondary to infection and as patient has clinically improved thrombocytopenia has slowly also improved.   During the possible course the patient was incidentally found to be going in and out of atrial fibrillation with rapid ventricular response.  Patient has been suffering from bouts of palpitations and shortness of breath for at least the past several months and has even recently been seen by cardiology for these complaints on 08/20/2023.  It is likely the patient has been suffering from paroxysmal atrial fibrillation this entire time.  Patient was initiated on low-dose metoprolol  twice  daily.  Patient also has a CHADSVASC score of 3 prompting consideration of anticoagulation however due to patient's thrombocytopenia this will be postponed until platelet count recovers.     Assessment & Plan Sepsis (HCC) Clinically improving with resolved fevers, improving leukocytosis and improving procalcitonin. Multiple sources of infection concerning for sepsis including bilateral lower lobe pneumonia (left worse than right) and urinary tract infection with early pyelonephritis.  CT imaging abdomen pelvis performed reveals bilateral lower lobe pneumonia but is quite dense in the left lower lobe in particular. CT imaging also reveals what appears to be an early left-sided pyelonephritis although surprisingly urine culture has revealed no growth. Blood cultures pending.  Urine culture surprisingly is without growth. Continuing intravenous levofloxacin  initiated on admission due to known history of penicillin allergy  Pneumonia of both lower lobes due to infectious organism See assessment and plan above Pyelonephritis of left kidney See assessment and plan above Paroxysmal atrial fibrillation with rapid ventricular response (HCC) Incidental finding during this hospitalization Patient intermittently develops rapid ventricular response with concurrent shortness of breath and palpitations Patient has been suffering from the symptoms for several months and was even evaluated in cardiology clinic earlier in the year for the symptoms without uncovering the atrial fibrillation.  Patient has likely been suffering from A-fib the entire time. Patient is been placed on metoprolol  12.5 mg twice daily which will be titrated upwards to achieve rate control. CHA2DS2-VASc score is 3, initiation of anticoagulation is being delayed until thrombocytopenia resolves. Echocardiogram already performed recently, will not be repeated here Obtaining TSH SLE (systemic lupus erythematosus) (HCC) Continue home  regimen of hydroxychloroquine  Was on Imuran in the outpatient setting leading to an immunocompromised state Prolonged QT interval Electrolyte abnormalities corrected, continuing to monitor. Monitoring on telemetry Minimizing use of QT prolonging  agents, will need to be cautious since patient is currently on a fluoroquinolone. Obtaining serial EKGs Acute left flank pain Patient suffering from substantial left flank pain likely secondary to pleuritic pain from dense left lower lobe pneumonia in addition to early pyelonephritis. As needed opiate-based analgesics for substantial pain. Thrombocytopenia (HCC) Substantial thrombocytopenia secondary to sepsis/infection As infection is improving thrombocytopenia slowly recovering No clinical evidence of bleeding Monitoring platelet counts with serial CBCs     Subjective:  Patient is continuing to complain of severe left flank pain sharp in quality, nonradiating, worse with movement.  Pain is slightly improved since yesterday.  Physical Exam:  Vitals:   11/09/23 0448 11/09/23 0915 11/09/23 1227 11/09/23 2024  BP: 128/84 118/68 91/67 (!) 106/54  Pulse: 92 60 93 95  Resp: 18  18 16   Temp: 98 F (36.7 C)  98.7 F (37.1 C) 98.5 F (36.9 C)  TempSrc:   Oral   SpO2: 94%  97% 98%  Weight:      Height:        Constitutional: Awake alert and oriented x3, patient is seen in slight distress due to pain. Skin: no rashes, no lesions, good skin turgor noted. Eyes: Pupils are equally reactive to light.  No evidence of scleral icterus or conjunctival pallor.  ENMT: Moist mucous membranes noted.  Posterior pharynx clear of any exudate or lesions.   Respiratory: Bibasilar rales, worse in the left lower lobe compared to right.  Normal respiratory effort. No accessory muscle use.  Cardiovascular:  Irregularly irregular rate and rhythm., no murmurs / rubs / gallops. No extremity edema. 2+ pedal pulses. No carotid bruits.  Abdomen: Some left-sided  abdominal pain noted.  No evidence of intra-abdominal masses.  Positive bowel sounds noted in all quadrants.   Musculoskeletal: No joint deformity upper and lower extremities. Good ROM, no contractures. Normal muscle tone.  Back: Notable tenderness at thoracic spine and left flank without deformity   Data Reviewed:  I have personally reviewed and interpreted labs, imaging.  Significant findings are   CBC: Recent Labs  Lab 11/06/23 1437 11/07/23 0517 11/08/23 0640 11/09/23 0542  WBC 18.6* 16.0* 12.4* 11.4*  NEUTROABS 16.7*  --  9.4* 8.3*  HGB 14.3 12.4 12.4 13.4  HCT 44.8 39.1 39.9 42.1  MCV 91.8 92.4 94.1 91.1  PLT 45* 24* 27* 37*   Basic Metabolic Panel: Recent Labs  Lab 11/06/23 1437 11/07/23 0517 11/08/23 0640 11/09/23 0542  NA 137 139 136 135  K 3.4* 3.7 3.9 3.6  CL 107 116* 110 107  CO2 22 20* 22 23  GLUCOSE 110* 100* 88 86  BUN 11 8 11 11   CREATININE 0.81 0.80 0.70 0.72  CALCIUM  8.1* 7.5* 7.9* 8.0*  MG  --  1.9 1.9 1.8   GFR: Estimated Creatinine Clearance: 53.6 mL/min (by C-G formula based on SCr of 0.72 mg/dL). Liver Function Tests: Recent Labs  Lab 11/06/23 1437 11/07/23 0517 11/08/23 0640 11/09/23 0542  AST 48* 35 32 38  ALT 33 25 25 32  ALKPHOS 63 54 59 85  BILITOT 1.1 1.0 0.8 0.8  PROT 7.5 6.0* 5.9* 6.1*  ALBUMIN 3.2* 2.4* 2.3* 2.3*    Coagulation Profile: Recent Labs  Lab 11/06/23 1437 11/08/23 1123  INR 1.1 1.0     EKG: Personally reviewed.  Rhythm is sinus tachycardia with heart rate of 99 bpm.  QTc of 507 ms   Code Status:  DNR.  Code status decision has been confirmed with: patient Family Communication:  Son is at the bedside and has been updated on plan of care.   Severity of Illness:  The appropriate patient status for this patient is INPATIENT. Inpatient status is judged to be reasonable and necessary in order to provide the required intensity of service to ensure the patient's safety. The patient's presenting symptoms,  physical exam findings, and initial radiographic and laboratory data in the context of their chronic comorbidities is felt to place them at high risk for further clinical deterioration. Furthermore, it is not anticipated that the patient will be medically stable for discharge from the hospital within 2 midnights of admission.   * I certify that at the point of admission it is my clinical judgment that the patient will require inpatient hospital care spanning beyond 2 midnights from the point of admission due to high intensity of service, high risk for further deterioration and high frequency of surveillance required.*  Time spent:  58 minutes  Author:  True Fuss MD

## 2023-11-10 DIAGNOSIS — D696 Thrombocytopenia, unspecified: Secondary | ICD-10-CM | POA: Insufficient documentation

## 2023-11-10 DIAGNOSIS — I480 Paroxysmal atrial fibrillation: Secondary | ICD-10-CM | POA: Insufficient documentation

## 2023-11-10 DIAGNOSIS — J189 Pneumonia, unspecified organism: Secondary | ICD-10-CM | POA: Diagnosis not present

## 2023-11-10 LAB — CBC WITH DIFFERENTIAL/PLATELET
Abs Immature Granulocytes: 0.12 10*3/uL — ABNORMAL HIGH (ref 0.00–0.07)
Basophils Absolute: 0 10*3/uL (ref 0.0–0.1)
Basophils Relative: 0 %
Eosinophils Absolute: 0.1 10*3/uL (ref 0.0–0.5)
Eosinophils Relative: 1 %
HCT: 41.2 % (ref 36.0–46.0)
Hemoglobin: 13.4 g/dL (ref 12.0–15.0)
Immature Granulocytes: 1 %
Lymphocytes Relative: 20 %
Lymphs Abs: 2.2 10*3/uL (ref 0.7–4.0)
MCH: 29 pg (ref 26.0–34.0)
MCHC: 32.5 g/dL (ref 30.0–36.0)
MCV: 89.2 fL (ref 80.0–100.0)
Monocytes Absolute: 0.9 10*3/uL (ref 0.1–1.0)
Monocytes Relative: 8 %
Neutro Abs: 7.5 10*3/uL (ref 1.7–7.7)
Neutrophils Relative %: 70 %
Platelets: 62 10*3/uL — ABNORMAL LOW (ref 150–400)
RBC: 4.62 MIL/uL (ref 3.87–5.11)
RDW: 15.6 % — ABNORMAL HIGH (ref 11.5–15.5)
WBC: 10.9 10*3/uL — ABNORMAL HIGH (ref 4.0–10.5)
nRBC: 0.4 % — ABNORMAL HIGH (ref 0.0–0.2)

## 2023-11-10 LAB — COMPREHENSIVE METABOLIC PANEL WITH GFR
ALT: 47 U/L — ABNORMAL HIGH (ref 0–44)
AST: 63 U/L — ABNORMAL HIGH (ref 15–41)
Albumin: 2.5 g/dL — ABNORMAL LOW (ref 3.5–5.0)
Alkaline Phosphatase: 111 U/L (ref 38–126)
Anion gap: 10 (ref 5–15)
BUN: 15 mg/dL (ref 8–23)
CO2: 22 mmol/L (ref 22–32)
Calcium: 8.4 mg/dL — ABNORMAL LOW (ref 8.9–10.3)
Chloride: 105 mmol/L (ref 98–111)
Creatinine, Ser: 0.82 mg/dL (ref 0.44–1.00)
GFR, Estimated: >60 mL/min (ref >60)
Glucose, Bld: 104 mg/dL — ABNORMAL HIGH (ref 70–99)
Potassium: 3.9 mmol/L (ref 3.5–5.1)
Sodium: 137 mmol/L (ref 135–145)
Total Bilirubin: 0.6 mg/dL (ref 0.0–1.2)
Total Protein: 6.4 g/dL — ABNORMAL LOW (ref 6.5–8.1)

## 2023-11-10 LAB — MAGNESIUM: Magnesium: 2.2 mg/dL (ref 1.7–2.4)

## 2023-11-10 LAB — LEGIONELLA PNEUMOPHILA SEROGP 1 UR AG: L. pneumophila Serogp 1 Ur Ag: NEGATIVE

## 2023-11-10 LAB — TSH: TSH: 1.128 u[IU]/mL (ref 0.350–4.500)

## 2023-11-10 MED ORDER — LEVOFLOXACIN 750 MG PO TABS
750.0000 mg | ORAL_TABLET | Freq: Every day | ORAL | Status: DC
  Administered 2023-11-10 – 2023-11-11 (×2): 750 mg via ORAL
  Filled 2023-11-10 (×2): qty 1

## 2023-11-10 NOTE — Assessment & Plan Note (Signed)
 Incidental finding during this hospitalization Patient intermittently develops rapid ventricular response with concurrent shortness of breath and palpitations Patient has been suffering from the symptoms for several months and was even evaluated in cardiology clinic earlier in the year for the symptoms without uncovering the atrial fibrillation.  Patient has likely been suffering from A-fib the entire time. Patient is been placed on metoprolol  12.5 mg twice daily which will be titrated upwards to achieve rate control. CHA2DS2-VASc score is 3, initiation of anticoagulation is being delayed until thrombocytopenia resolves. Echocardiogram already performed recently, will not be repeated here Obtaining TSH

## 2023-11-10 NOTE — Progress Notes (Signed)
 Physical Therapy Treatment Patient Details Name: Jane Chavez MRN: [id number] DOB: May 20, 1946 Today's Date: 11/10/2023   History of Present Illness 78 y.o. female admitted on 11/06/23 for CAP and sepsis.  Past medical history significant for SLE, bil TKAs    PT Comments  Pt seated on toilet at start of session and agreeable to therapy visit. CGA off toilet and pt amb with RW to recliner for seated rest and to don back robe.Supervision for power up from recliner and pt amb ~250' with 1x standing rest break. VSS throughout ambulation with SpO2 96% or better on RA. EOS pt preferring to remain OOB in recliner and reviewed exercises for LE to facilitate muscle activation and circulation. Will continue to progress pt as able during acute stay.    If plan is discharge home, recommend the following: A little help with walking and/or transfers;A little help with bathing/dressing/bathroom;Assistance with cooking/housework;Help with stairs or ramp for entrance   Can travel by private vehicle        Equipment Recommendations  Rolling walker (2 wheels) (has one at home)    Recommendations for Other Services       Precautions / Restrictions Precautions Precautions: Fall Recall of Precautions/Restrictions: Intact Restrictions Weight Bearing Restrictions Per Provider Order: No     Mobility  Bed Mobility               General bed mobility comments: pt OOB on toilet, then to recliner    Transfers Overall transfer level: Needs assistance Equipment used: Rolling walker (2 wheels) Transfers: Sit to/from Stand Sit to Stand: Contact guard assist, Supervision                Ambulation/Gait Ambulation/Gait assistance: Contact guard assist, Supervision Gait Distance (Feet): 250 Feet Assistive device: Rolling walker (2 wheels) Gait Pattern/deviations: Step-through pattern, Decreased stride length, Trunk flexed Gait velocity: decr         Stairs             Wheelchair  Mobility     Tilt Bed    Modified Rankin (Stroke Patients Only)       Balance Overall balance assessment: Mild deficits observed, not formally tested                                          Communication Communication Communication: No apparent difficulties  Cognition Arousal: Alert Behavior During Therapy: WFL for tasks assessed/performed   PT - Cognitive impairments: No apparent impairments                         Following commands: Intact      Cueing Cueing Techniques: Verbal cues  Exercises General Exercises - Lower Extremity Ankle Circles/Pumps: AROM, Both, 10 reps Quad Sets: AROM, Both, 10 reps Gluteal Sets: AROM, Both, 10 reps    General Comments        Pertinent Vitals/Pain Pain Assessment Pain Assessment: Faces Faces Pain Scale: Hurts a little bit Pain Location: back Pain Descriptors / Indicators: Discomfort Pain Intervention(s): Limited activity within patient's tolerance, Monitored during session, Repositioned    Home Living                          Prior Function            PT Goals (current goals can now be found  in the care plan section) Acute Rehab PT Goals Patient Stated Goal: get home PT Goal Formulation: With patient Time For Goal Achievement: 11/21/23 Potential to Achieve Goals: Good Progress towards PT goals: Progressing toward goals    Frequency    Min 2X/week      PT Plan      Co-evaluation              AM-PAC PT "6 Clicks" Mobility   Outcome Measure  Help needed turning from your back to your side while in a flat bed without using bedrails?: A Little Help needed moving from lying on your back to sitting on the side of a flat bed without using bedrails?: A Little Help needed moving to and from a bed to a chair (including a wheelchair)?: A Little Help needed standing up from a chair using your arms (e.g., wheelchair or bedside chair)?: A Little Help needed to walk in  hospital room?: A Little Help needed climbing 3-5 steps with a railing? : A Lot 6 Click Score: 17    End of Session Equipment Utilized During Treatment: Gait belt Activity Tolerance: Patient tolerated treatment well Patient left: in chair;with call bell/phone within reach;with chair alarm set Nurse Communication: Mobility status PT Visit Diagnosis: Difficulty in walking, not elsewhere classified (R26.2);Unsteadiness on feet (R26.81)     Time: 9147-8295 PT Time Calculation (min) (ACUTE ONLY): 20 min  Charges:    $Gait Training: 8-22 mins PT General Charges $$ ACUTE PT VISIT: 1 Visit                     Tish Forge, DPT Acute Rehabilitation Services Office [phone]  11/10/23 3:00 PM

## 2023-11-10 NOTE — Plan of Care (Signed)
  Problem: Clinical Measurements: Goal: Will remain free from infection Outcome: Progressing   Problem: Coping: Goal: Level of anxiety will decrease Outcome: Progressing   Problem: Elimination: Goal: Will not experience complications related to bowel motility Outcome: Progressing   Problem: Safety: Goal: Ability to remain free from injury will improve Outcome: Progressing   

## 2023-11-10 NOTE — Plan of Care (Signed)
  Problem: Education: Goal: Knowledge of General Education information will improve Description: Including pain rating scale, medication(s)/side effects and non-pharmacologic comfort measures Outcome: Progressing   Problem: Health Behavior/Discharge Planning: Goal: Ability to manage health-related needs will improve Outcome: Progressing   Problem: Clinical Measurements: Goal: Will remain free from infection Outcome: Progressing Goal: Respiratory complications will improve Outcome: Progressing   Problem: Activity: Goal: Risk for activity intolerance will decrease Outcome: Progressing   Problem: Nutrition: Goal: Adequate nutrition will be maintained Outcome: Progressing   Problem: Coping: Goal: Level of anxiety will decrease Outcome: Progressing   Problem: Pain Managment: Goal: General experience of comfort will improve and/or be controlled Outcome: Progressing   Problem: Safety: Goal: Ability to remain free from injury will improve Outcome: Progressing   Problem: Skin Integrity: Goal: Risk for impaired skin integrity will decrease Outcome: Progressing   Problem: Clinical Measurements: Goal: Ability to maintain clinical measurements within normal limits will improve Outcome: Not Progressing Goal: Diagnostic test results will improve Outcome: Not Progressing Goal: Cardiovascular complication will be avoided Outcome: Not Progressing   Problem: Elimination: Goal: Will not experience complications related to bowel motility Outcome: Not Progressing Goal: Will not experience complications related to urinary retention Outcome: Not Progressing

## 2023-11-10 NOTE — Assessment & Plan Note (Signed)
 Substantial thrombocytopenia secondary to sepsis/infection As infection is improving thrombocytopenia slowly recovering No clinical evidence of bleeding Monitoring platelet counts with serial CBCs

## 2023-11-10 NOTE — Progress Notes (Signed)
 PROGRESS NOTE    Jane Chavez  AOZ:[id number] DOB: 02/15/1946 DOA: 11/06/2023 PCP: [facility]    Brief Narrative:  78 year old with history of SLE, GERD, hyperlipidemia presented with weakness, bilateral flank pain, dizziness in the ER.  In the emergency room, she was found to have leukocytosis, low-grade fever and tachycardia.  A CT scan showed mild left-sided pyelonephritis as well as left in the right lower lobe pneumonia with left lower lobe consolidation.  Treated with antibiotics. Hospital completed by developing thrombocytopenia that is already improving. Patient also noted to have intermittent A-fib.  Subjective: Patient seen and examined.  She has remained afebrile.  She feels weak otherwise no other complaints.  She has some dry cough and it hurts to cough.  Cultures negative so far. Telemetry shows sinus rhythm. Assessment & Plan:   Sepsis present on admission due to bilateral lower lobe pneumonia: Presented with fever, leukocytosis and tachypnea tachycardia. Found to have bilateral lower lobe pneumonia, left more than right. Blood cultures, coag negative Staphylococcus. Urine cultures, no growth so far. On IV Levaquin  and already responding, continue Levaquin  to complete total 7 days of therapy. Chest physiotherapy, mobility, PT OT.  Incentive and flutter valve.  Paroxysmal A-fib with RVR: Intermittent A-fib.  Now on metoprolol  12.5 mg twice daily. Recent a stress test in echocardiogram with normal ejection fraction. Patient with sepsis induced thrombocytopenia, will discuss about ambulatory heart rate monitoring on discharge.  She is seen by cardiology as outpatient.  Thrombocytopenia: Secondary to sepsis and infection.  Gradually improving.  Monitor closely.  DVT prophylaxis: Place and maintain sequential compression device Start: 11/06/23 1736   Code Status: DNR limited intervention Family Communication: None at the bedside Disposition Plan:  Status is: Inpatient Remains inpatient appropriate because: Thrombocytopenia, response to antibiotics     Consultants:  None  Procedures:  None  Antimicrobials:  Levaquin  5/29---     Objective: Vitals:   11/09/23 0915 11/09/23 1227 11/09/23 2024 11/10/23 0346  BP: 118/68 91/67 (!) 106/54 138/72  Pulse: 60 93 95 87  Resp:  18 16 16   Temp:  98.7 F (37.1 C) 98.5 F (36.9 C) 97.6 F (36.4 C)  TempSrc:  Oral    SpO2:  97% 98% 93%  Weight:      Height:        Intake/Output Summary (Last 24 hours) at 11/10/2023 0945 Last data filed at 11/10/2023 0300 Gross per 24 hour  Intake 525.87 ml  Output --  Net 525.87 ml   Filed Weights   11/06/23 1406  Weight: 68 kg    Examination:  General exam: Appears calm and comfortable.  Sitting in chair. Respiratory system: Clear to auscultation. Respiratory effort normal.  No added sounds.  On room air. Cardiovascular system: S1 & S2 heard, RRR.  Gastrointestinal system: Soft and nontender.  Bowel sound present. Central nervous system: Alert and oriented. No focal neurological deficits.    Data Reviewed: I have personally reviewed following labs and imaging studies  CBC: Recent Labs  Lab 11/06/23 1437 11/07/23 0517 11/08/23 0640 11/09/23 0542 11/10/23 0451  WBC 18.6* 16.0* 12.4* 11.4* 10.9*  NEUTROABS 16.7*  --  9.4* 8.3* 7.5  HGB 14.3 12.4 12.4 13.4 13.4  HCT 44.8 39.1 39.9 42.1 41.2  MCV 91.8 92.4 94.1 91.1 89.2  PLT 45* 24* 27* 37* 62*   Basic Metabolic Panel: Recent Labs  Lab 11/06/23 1437 11/07/23 0517 11/08/23 0640 11/09/23 0542 11/10/23 0451  NA 137 139 136 135 137  K 3.4* 3.7 3.9 3.6 3.9  CL 107 116* 110 107 105  CO2 22 20* 22 23 22   GLUCOSE 110* 100* 88 86 104*  BUN 11 8 11 11 15   CREATININE 0.81 0.80 0.70 0.72 0.82  CALCIUM  8.1* 7.5* 7.9* 8.0* 8.4*  MG  --  1.9 1.9 1.8 2.2   GFR: Estimated Creatinine Clearance: 52.3 mL/min (by C-G formula based on SCr of 0.82 mg/dL). Liver Function  Tests: Recent Labs  Lab 11/06/23 1437 11/07/23 0517 11/08/23 0640 11/09/23 0542 11/10/23 0451  AST 48* 35 32 38 63*  ALT 33 25 25 32 47*  ALKPHOS 63 54 59 85 111  BILITOT 1.1 1.0 0.8 0.8 0.6  PROT 7.5 6.0* 5.9* 6.1* 6.4*  ALBUMIN 3.2* 2.4* 2.3* 2.3* 2.5*   No results for input(s): "LIPASE", "AMYLASE" in the last 168 hours. No results for input(s): "AMMONIA" in the last 168 hours. Coagulation Profile: Recent Labs  Lab 11/06/23 1437 11/08/23 1123  INR 1.1 1.0   Cardiac Enzymes: No results for input(s): "CKTOTAL", "CKMB", "CKMBINDEX", "TROPONINI" in the last 168 hours. BNP (last 3 results) No results for input(s): "PROBNP" in the last 8760 hours. HbA1C: No results for input(s): "HGBA1C" in the last 72 hours. CBG: Recent Labs  Lab 11/06/23 1402  GLUCAP 86   Lipid Profile: No results for input(s): "CHOL", "HDL", "LDLCALC", "TRIG", "CHOLHDL", "LDLDIRECT" in the last 72 hours. Thyroid  Function Tests: Recent Labs    11/10/23 0451  TSH 1.128   Anemia Panel: No results for input(s): "VITAMINB12", "FOLATE", "FERRITIN", "TIBC", "IRON", "RETICCTPCT" in the last 72 hours. Sepsis Labs: Recent Labs  Lab 11/06/23 1504 11/06/23 1808 11/09/23 0542  PROCALCITON  --  1.24 0.70  LATICACIDVEN 1.8  --   --     Recent Results (from the past 240 hours)  Culture, blood (Routine x 2)     Status: Abnormal   Collection Time: 11/06/23  2:37 PM   Specimen: BLOOD RIGHT ARM  Result Value Ref Range Status   Specimen Description   Final    BLOOD RIGHT ARM Performed at [facility], [address], [place], [place] [address]    Special Requests   Final    BOTTLES DRAWN AEROBIC AND ANAEROBIC Blood Culture results may not be optimal due to an inadequate volume of blood received in culture bottles Performed at [facility], [address], [place], [place] [address]    Culture  Setup Time   Final    GRAM POSITIVE COCCI IN CLUSTERS AEROBIC BOTTLE ONLY CRITICAL  RESULT CALLED TO, READ BACK BY AND VERIFIED WITH: PHARMD N. GLOGOVAC [id number] @ 1903 FH    Culture (A)  Final    STAPHYLOCOCCUS HOMINIS THE SIGNIFICANCE OF ISOLATING THIS ORGANISM FROM A SINGLE VENIPUNCTURE CANNOT BE PREDICTED WITHOUT FURTHER CLINICAL AND CULTURE CORRELATION. SUSCEPTIBILITIES AVAILABLE ONLY ON REQUEST. Performed at [facility], [address] [place], [place] [address]    Report Status 11/09/2023 FINAL  Final  Resp panel by RT-PCR (RSV, Flu A&B, Covid) Nasopharyngeal Swab     Status: None   Collection Time: 11/06/23  2:37 PM   Specimen: Nasopharyngeal Swab; Nasal Swab  Result Value Ref Range Status   SARS Coronavirus 2 by RT PCR NEGATIVE NEGATIVE Final    Comment: (NOTE) SARS-CoV-2 target nucleic acids are NOT DETECTED.  The SARS-CoV-2 RNA is generally detectable in upper respiratory specimens during the acute phase of infection. The lowest concentration of SARS-CoV-2 viral copies this  assay can detect is 138 copies/mL. A negative result does not preclude SARS-Cov-2 infection and should not be used as the sole basis for treatment or other patient management decisions. A negative result may occur with  improper specimen collection/handling, submission of specimen other than nasopharyngeal swab, presence of viral mutation(s) within the areas targeted by this assay, and inadequate number of viral copies(<138 copies/mL). A negative result must be combined with clinical observations, patient history, and epidemiological information. The expected result is Negative.  Fact Sheet for Patients:  BloggerCourse.com  Fact Sheet for Healthcare Providers:  SeriousBroker.it  This test is no t yet approved or cleared by the [place]  FDA and  has been authorized for detection and/or diagnosis of SARS-CoV-2 by FDA under an Emergency Use Authorization (EUA). This EUA will remain  in effect (meaning this test can be used)  for the duration of the COVID-19 declaration under Section 564(b)(1) of the Act, 21 U.S.C.section 360bbb-3(b)(1), unless the authorization is terminated  or revoked sooner.       Influenza A by PCR NEGATIVE NEGATIVE Final   Influenza B by PCR NEGATIVE NEGATIVE Final    Comment: (NOTE) The Xpert Xpress SARS-CoV-2/FLU/RSV plus assay is intended as an aid in the diagnosis of influenza from Nasopharyngeal swab specimens and should not be used as a sole basis for treatment. Nasal washings and aspirates are unacceptable for Xpert Xpress SARS-CoV-2/FLU/RSV testing.  Fact Sheet for Patients: BloggerCourse.com  Fact Sheet for Healthcare Providers: SeriousBroker.it  This test is not yet approved or cleared by the [place]  FDA and has been authorized for detection and/or diagnosis of SARS-CoV-2 by FDA under an Emergency Use Authorization (EUA). This EUA will remain in effect (meaning this test can be used) for the duration of the COVID-19 declaration under Section 564(b)(1) of the Act, 21 U.S.C. section 360bbb-3(b)(1), unless the authorization is terminated or revoked.     Resp Syncytial Virus by PCR NEGATIVE NEGATIVE Final    Comment: (NOTE) Fact Sheet for Patients: BloggerCourse.com  Fact Sheet for Healthcare Providers: SeriousBroker.it  This test is not yet approved or cleared by the [place]  FDA and has been authorized for detection and/or diagnosis of SARS-CoV-2 by FDA under an Emergency Use Authorization (EUA). This EUA will remain in effect (meaning this test can be used) for the duration of the COVID-19 declaration under Section 564(b)(1) of the Act, 21 U.S.C. section 360bbb-3(b)(1), unless the authorization is terminated or revoked.  Performed at [facility], [address] [place], [place] [address]   Respiratory (~20 pathogens) panel by PCR      Status: None   Collection Time: 11/06/23  2:37 PM   Specimen: Nasopharyngeal Swab; Respiratory  Result Value Ref Range Status   Adenovirus NOT DETECTED NOT DETECTED Final   Coronavirus 229E NOT DETECTED NOT DETECTED Final    Comment: (NOTE) The Coronavirus on the Respiratory Panel, DOES NOT test for the novel  Coronavirus (2019 nCoV)    Coronavirus HKU1 NOT DETECTED NOT DETECTED Final   Coronavirus NL63 NOT DETECTED NOT DETECTED Final   Coronavirus OC43 NOT DETECTED NOT DETECTED Final   Metapneumovirus NOT DETECTED NOT DETECTED Final   Rhinovirus / Enterovirus NOT DETECTED NOT DETECTED Final   Influenza A NOT DETECTED NOT DETECTED Final   Influenza B NOT DETECTED NOT DETECTED Final   Parainfluenza Virus 1 NOT DETECTED NOT DETECTED Final   Parainfluenza Virus 2 NOT DETECTED NOT DETECTED Final   Parainfluenza Virus 3 NOT DETECTED NOT DETECTED Final  Parainfluenza Virus 4 NOT DETECTED NOT DETECTED Final   Respiratory Syncytial Virus NOT DETECTED NOT DETECTED Final   Bordetella pertussis NOT DETECTED NOT DETECTED Final   Bordetella Parapertussis NOT DETECTED NOT DETECTED Final   Chlamydophila pneumoniae NOT DETECTED NOT DETECTED Final   Mycoplasma pneumoniae NOT DETECTED NOT DETECTED Final    Comment: Performed at [facility], [address] [place], [place] [address]  Blood Culture ID Panel (Reflexed)     Status: Abnormal   Collection Time: 11/06/23  2:37 PM  Result Value Ref Range Status   Enterococcus faecalis NOT DETECTED NOT DETECTED Final   Enterococcus Faecium NOT DETECTED NOT DETECTED Final   Listeria monocytogenes NOT DETECTED NOT DETECTED Final   Staphylococcus species DETECTED (A) NOT DETECTED Final    Comment: CRITICAL RESULT CALLED TO, READ BACK BY AND VERIFIED WITH: PHARMD N. GLOGOVAC [id number] @ 1903 FH    Staphylococcus aureus (BCID) NOT DETECTED NOT DETECTED Final   Staphylococcus epidermidis NOT DETECTED NOT DETECTED Final   Staphylococcus lugdunensis  NOT DETECTED NOT DETECTED Final   Streptococcus species NOT DETECTED NOT DETECTED Final   Streptococcus agalactiae NOT DETECTED NOT DETECTED Final   Streptococcus pneumoniae NOT DETECTED NOT DETECTED Final   Streptococcus pyogenes NOT DETECTED NOT DETECTED Final   A.calcoaceticus-baumannii NOT DETECTED NOT DETECTED Final   Bacteroides fragilis NOT DETECTED NOT DETECTED Final   Enterobacterales NOT DETECTED NOT DETECTED Final   Enterobacter cloacae complex NOT DETECTED NOT DETECTED Final   Escherichia coli NOT DETECTED NOT DETECTED Final   Klebsiella aerogenes NOT DETECTED NOT DETECTED Final   Klebsiella oxytoca NOT DETECTED NOT DETECTED Final   Klebsiella pneumoniae NOT DETECTED NOT DETECTED Final   Proteus species NOT DETECTED NOT DETECTED Final   Salmonella species NOT DETECTED NOT DETECTED Final   Serratia marcescens NOT DETECTED NOT DETECTED Final   Haemophilus influenzae NOT DETECTED NOT DETECTED Final   Neisseria meningitidis NOT DETECTED NOT DETECTED Final   Pseudomonas aeruginosa NOT DETECTED NOT DETECTED Final   Stenotrophomonas maltophilia NOT DETECTED NOT DETECTED Final   Candida albicans NOT DETECTED NOT DETECTED Final   Candida auris NOT DETECTED NOT DETECTED Final   Candida glabrata NOT DETECTED NOT DETECTED Final   Candida krusei NOT DETECTED NOT DETECTED Final   Candida parapsilosis NOT DETECTED NOT DETECTED Final   Candida tropicalis NOT DETECTED NOT DETECTED Final   Cryptococcus neoformans/gattii NOT DETECTED NOT DETECTED Final    Comment: Performed at [facility], [address], [place], [place] [address]  Urine Culture (for pregnant, neutropenic or urologic patients or patients with an indwelling urinary catheter)     Status: None   Collection Time: 11/06/23  7:38 PM   Specimen: Urine, Clean Catch  Result Value Ref Range Status   Specimen Description   Final    URINE, CLEAN CATCH Performed at [facility], [address],  [place], [place] [address]    Special Requests   Final    NONE Performed at [facility], [address] [place], [place] [address]    Culture   Final    NO GROWTH Performed at [facility], [address], [place], [place] [address]    Report Status 11/07/2023 FINAL  Final  Culture, blood (Routine X 2) w Reflex to ID Panel     Status: None (Preliminary result)   Collection Time: 11/07/23  5:17 AM   Specimen: BLOOD  Result Value Ref Range Status  Specimen Description   Final    BLOOD LEFT ANTECUBITAL Performed at [facility], [address] [place], [place] [address]    Special Requests   Final    BOTTLES DRAWN AEROBIC ONLY Blood Culture adequate volume Performed at [facility], [address] [place], [place] [address]    Culture   Final    NO GROWTH 3 DAYS Performed at [facility], [address], [place], [place] [address]    Report Status PENDING  Incomplete         Radiology Studies: CT ABDOMEN PELVIS W CONTRAST Result Date: 11/08/2023 CLINICAL DATA:  Pyelonephritis EXAM: CT ABDOMEN AND PELVIS WITH CONTRAST TECHNIQUE: Multidetector CT imaging of the abdomen and pelvis was performed using the standard protocol following bolus administration of intravenous contrast. RADIATION DOSE REDUCTION: This exam was performed according to the departmental dose-optimization program which includes automated exposure control, adjustment of the mA and/or kV according to patient size and/or use of iterative reconstruction technique. CONTRAST:  80mL OMNIPAQUE  IOHEXOL  300 MG/ML  SOLN COMPARISON:  CT of the abdomen and pelvis performed November 26, 2022 FINDINGS: Lower chest: Bibasilar airspace consolidation is present which is worse in the left lung. Trace bilateral pleural effusions. Hepatobiliary: No focal liver abnormality is seen. No gallstones, gallbladder wall thickening, or biliary dilatation. Pancreas: Unremarkable. No pancreatic  ductal dilatation or surrounding inflammatory changes. Spleen: Normal in size without focal abnormality. Adrenals/Urinary Tract: Both kidneys are normal in size with small peripheral low attenuation densities which may represent tiny cysts. A right lower pole renal cyst is present which measures 1.7 cm and is favored to be simple. There is no hydronephrosis. There is very subtle peripheral enhancement of the left renal collecting system proximally. Stomach/Bowel: Small hiatal hernia. Oral contrast has been administered and is present to the level of the colon. Scattered colonic diverticulosis. Grossly similar appearance of gastric lipoma. The appendix is well seen and within normal limits. Vascular/Lymphatic: No significant vascular findings are present. No enlarged abdominal or pelvic lymph nodes. Reproductive: Uterus and bilateral adnexa are unremarkable. Other: Nothing significant. Musculoskeletal: Degenerative changes are present in the lumbar spine. Prior vertebral augmentation in the lower thoracic spine. IMPRESSION: 1. There is very mild enhancement of the left renal collecting system which can be observed in the setting of upper urinary tract infection. There is no hydronephrosis or significant stone disease. There is no evidence of perinephric abscess. 2. Bibasilar airspace consolidation compatible with pneumonia, worst in the left lower lobe. Electronically Signed   By: Reagan Camera M.D.   On: 11/08/2023 14:20        Scheduled Meds:  atorvastatin   10 mg Oral Daily   hydroxychloroquine   400 mg Oral Daily   levofloxacin   750 mg Oral Daily   metoprolol  tartrate  12.5 mg Oral BID   pantoprazole   40 mg Oral Daily   polyethylene glycol  17 g Oral Once   predniSONE   10 mg Oral Q breakfast   senna  2 tablet Oral QHS   Continuous Infusions:   LOS: 4 days    Time spent: 40 minutes    Vada Garibaldi, MD [facility]

## 2023-11-10 NOTE — Plan of Care (Signed)

## 2023-11-11 DIAGNOSIS — J189 Pneumonia, unspecified organism: Secondary | ICD-10-CM | POA: Diagnosis not present

## 2023-11-11 LAB — CBC WITH DIFFERENTIAL/PLATELET
Abs Immature Granulocytes: 0.16 10*3/uL — ABNORMAL HIGH (ref 0.00–0.07)
Basophils Absolute: 0.1 10*3/uL (ref 0.0–0.1)
Basophils Relative: 1 %
Eosinophils Absolute: 0.2 10*3/uL (ref 0.0–0.5)
Eosinophils Relative: 2 %
HCT: 41.2 % (ref 36.0–46.0)
Hemoglobin: 13.1 g/dL (ref 12.0–15.0)
Immature Granulocytes: 2 %
Lymphocytes Relative: 24 %
Lymphs Abs: 2.2 10*3/uL (ref 0.7–4.0)
MCH: 29.1 pg (ref 26.0–34.0)
MCHC: 31.8 g/dL (ref 30.0–36.0)
MCV: 91.6 fL (ref 80.0–100.0)
Monocytes Absolute: 0.8 10*3/uL (ref 0.1–1.0)
Monocytes Relative: 9 %
Neutro Abs: 5.7 10*3/uL (ref 1.7–7.7)
Neutrophils Relative %: 62 %
Platelets: 83 10*3/uL — ABNORMAL LOW (ref 150–400)
RBC: 4.5 MIL/uL (ref 3.87–5.11)
RDW: 15.5 % (ref 11.5–15.5)
WBC: 9.1 10*3/uL (ref 4.0–10.5)
nRBC: 0.4 % — ABNORMAL HIGH (ref 0.0–0.2)

## 2023-11-11 MED ORDER — OXYCODONE HCL 5 MG PO TABS: 5.0000 mg | ORAL_TABLET | ORAL | 0 refills | Status: AC | PRN

## 2023-11-11 MED ORDER — METOPROLOL TARTRATE 25 MG PO TABS: 12.5000 mg | ORAL_TABLET | Freq: Two times a day (BID) | ORAL | 0 refills | Status: AC

## 2023-11-11 MED ORDER — LEVOFLOXACIN 750 MG PO TABS: 750.0000 mg | ORAL_TABLET | Freq: Every day | ORAL | 0 refills | Status: AC

## 2023-11-11 NOTE — Progress Notes (Signed)
 Discharge instructions given to patient questions asked and answered. Sylvia Everts RN

## 2023-11-11 NOTE — Care Management Important Message (Signed)
 Important Message  Patient Details IM Letter given to the Patient. Name: Jane Chavez MRN: [id number] Date of Birth: 27-Dec-1945   Important Message Given:  Yes - Medicare IM     [name], Rolin 11/11/2023, 9:51 AM

## 2023-11-11 NOTE — Discharge Summary (Signed)
 Physician Discharge Summary  Jane Chavez ZOX:[id number] DOB: August 13, 1945 DOA: 11/06/2023  PCP: [facility]  Admit date: 11/06/2023 Discharge date: 11/11/2023  Admitted From: Home Disposition: Home with home health  Recommendations for Outpatient Follow-up:  Follow up with PCP in 1-2 weeks Please obtain BMP/CBC in one week   Home Health: PT/OT Equipment/Devices: Available at home  Discharge Condition: Stable CODE STATUS: DNR with limited intervention Diet recommendation: low salt diet   Discharge Summary: 78 year old with history of SLE, GERD, hyperlipidemia presented with weakness, bilateral flank pain, dizziness in the ER.  In the emergency room, she was found to have leukocytosis, low-grade fever and tachycardia.  A CT scan showed mild left-sided pyelonephritis as well as left in the right lower lobe pneumonia with left lower lobe consolidation.  Treated with antibiotics. Hospital completed by developing thrombocytopenia that is already improving. Patient also noted to have intermittent A-fib while she was febrile.   medically stable now.  Will go home and complete oral antibiotics for total 7 days.   # Sepsis present on admission due to bilateral lower lobe pneumonia: Presented with fever, leukocytosis and tachypnea tachycardia. Found to have bilateral lower lobe pneumonia, left more than right. Blood cultures, coag negative Staphylococcus. Urine cultures, no growth so far. Was On IV Levaquin  and already responding, continue Levaquin  to complete total 7 days of therapy. Adequately improved.  He still has some pleuritic chest pain, she will use local therapies including heat, Tylenol  and will use oxycodone  for moderate to severe pain.  Recommended to continue chest physiotherapy and incentive at home.   Paroxysmal A-fib with RVR: Intermittent A-fib while febrile.  Now sinus rhythm.  Started on metoprolol . Recent stress test and  echocardiogram with normal  ejection fraction. Patient with sepsis induced thrombocytopenia that is already improving now. Patient had transient A-fib related to fever.  Will not treat with anticoagulation now.  She has outpatient cardiology follow-up.   Thrombocytopenia: Secondary to sepsis and infection.  Gradually improving.  Adequate today.  Please recheck in 1 week.  Lupus: Patient can resume her home medications.  Decondition and debility: Patient will benefit with PT OT at home.  Stable for discharge.  Discharge Diagnoses:  Principal Problem:   Sepsis (HCC) Active Problems:   SLE (systemic lupus erythematosus) (HCC)   Pneumonia of both lower lobes due to infectious organism   Pyelonephritis of left kidney   Prolonged QT interval   Acute left flank pain   Paroxysmal atrial fibrillation with rapid ventricular response (HCC)   Thrombocytopenia (HCC)    Discharge Instructions  Discharge Instructions     Diet - low sodium heart healthy   Complete by: As directed    Increase activity slowly   Complete by: As directed       Allergies as of 11/11/2023       Reactions   Penicillins Shortness Of Breath, Palpitations, Other (See Comments)   Has patient had a PCN reaction causing immediate rash, facial/tongue/throat swelling, SOB or lightheadedness with hypotension: Yes Has patient had a PCN reaction causing severe rash involving mucus membranes or skin necrosis: No Has patient had a PCN reaction that required hospitalization No Has patient had a PCN reaction occurring within the last 10 years: No If all of the above answers are "NO", then may proceed with Cephalosporin use.        Medication List     STOP taking these medications    Klor-Con  M20 20 MEQ tablet Generic drug: potassium chloride   SA       TAKE these medications    AMBULATORY NON FORMULARY MEDICATION Medication Name: Topical diltiazem 2 % and Lidocaine  5% for anal fissure- pea sized amount applied TID until healed    atorvastatin  10 MG tablet Commonly known as: LIPITOR Take 1 tablet (10 mg total) by mouth daily.   azaTHIOprine 50 MG tablet Commonly known as: IMURAN Take 100 mg by mouth daily.   carboxymethylcellul-glycerin 0.5-0.9 % ophthalmic solution Commonly known as: REFRESH OPTIVE Place 1 drop into both eyes 2 (two) times daily as needed for dry eyes.   ergocalciferol 1.25 MG (50000 UT) capsule Commonly known as: VITAMIN D2 Take 50,000 Units by mouth every Tuesday.   famotidine  20 MG tablet Commonly known as: PEPCID  Take 1 tablet (20 mg total) by mouth daily as needed for indigestion or heartburn.   furosemide  40 MG tablet Commonly known as: LASIX  Take 40 mg by mouth in the morning.   hydroxychloroquine  200 MG tablet Commonly known as: PLAQUENIL  Take 1 tablet (200 mg total) by mouth daily. What changed: how much to take   levofloxacin  750 MG tablet Commonly known as: LEVAQUIN  Take 1 tablet (750 mg total) by mouth daily for 2 days.   metoprolol  tartrate 25 MG tablet Commonly known as: LOPRESSOR  Take 0.5 tablets (12.5 mg total) by mouth 2 (two) times daily.   omeprazole  20 MG capsule Commonly known as: PRILOSEC TAKE 1 CAPSULE BY MOUTH EVERY DAY   oxyCODONE  5 MG immediate release tablet Commonly known as: Oxy IR/ROXICODONE  Take 1 tablet (5 mg total) by mouth every 4 (four) hours as needed for up to 3 days for severe pain (pain score 7-10).   potassium chloride  10 MEQ tablet Commonly known as: KLOR-CON  Take 10 mEq by mouth daily.   predniSONE  5 MG tablet Commonly known as: DELTASONE  Take 5 mg by mouth daily with breakfast.   tiZANidine  4 MG tablet Commonly known as: Zanaflex  Take 1 tablet (4 mg total) by mouth every 6 (six) hours as needed for muscle spasms.   Tylenol  325 MG tablet Generic drug: acetaminophen  Take 325-650 mg by mouth every 8 (eight) hours as needed for mild pain (pain score 1-3) or headache.        Follow-up Information     [facility] Follow up.   Why: Once you are discharged from the hospital,someone from [facility] will contact you to set up your Home Health Physical therapy services. Contact information: [address] [place] [address] [phone]                Allergies  Allergen Reactions   Penicillins Shortness Of Breath, Palpitations and Other (See Comments)    Has patient had a PCN reaction causing immediate rash, facial/tongue/throat swelling, SOB or lightheadedness with hypotension: Yes Has patient had a PCN reaction causing severe rash involving mucus membranes or skin necrosis: No Has patient had a PCN reaction that required hospitalization No Has patient had a PCN reaction occurring within the last 10 years: No If all of the above answers are "NO", then may proceed with Cephalosporin use.     Consultations: None   Procedures/Studies: CT ABDOMEN PELVIS W CONTRAST Result Date: 11/08/2023 CLINICAL DATA:  Pyelonephritis EXAM: CT ABDOMEN AND PELVIS WITH CONTRAST TECHNIQUE: Multidetector CT imaging of the abdomen and pelvis was performed using the standard protocol following bolus administration of intravenous contrast. RADIATION DOSE REDUCTION: This exam was performed according to the departmental dose-optimization program which includes automated  exposure control, adjustment of the mA and/or kV according to patient size and/or use of iterative reconstruction technique. CONTRAST:  80mL OMNIPAQUE  IOHEXOL  300 MG/ML  SOLN COMPARISON:  CT of the abdomen and pelvis performed November 26, 2022 FINDINGS: Lower chest: Bibasilar airspace consolidation is present which is worse in the left lung. Trace bilateral pleural effusions. Hepatobiliary: No focal liver abnormality is seen. No gallstones, gallbladder wall thickening, or biliary dilatation. Pancreas: Unremarkable. No pancreatic ductal dilatation or surrounding inflammatory changes. Spleen: Normal in size without focal abnormality.  Adrenals/Urinary Tract: Both kidneys are normal in size with small peripheral low attenuation densities which may represent tiny cysts. A right lower pole renal cyst is present which measures 1.7 cm and is favored to be simple. There is no hydronephrosis. There is very subtle peripheral enhancement of the left renal collecting system proximally. Stomach/Bowel: Small hiatal hernia. Oral contrast has been administered and is present to the level of the colon. Scattered colonic diverticulosis. Grossly similar appearance of gastric lipoma. The appendix is well seen and within normal limits. Vascular/Lymphatic: No significant vascular findings are present. No enlarged abdominal or pelvic lymph nodes. Reproductive: Uterus and bilateral adnexa are unremarkable. Other: Nothing significant. Musculoskeletal: Degenerative changes are present in the lumbar spine. Prior vertebral augmentation in the lower thoracic spine. IMPRESSION: 1. There is very mild enhancement of the left renal collecting system which can be observed in the setting of upper urinary tract infection. There is no hydronephrosis or significant stone disease. There is no evidence of perinephric abscess. 2. Bibasilar airspace consolidation compatible with pneumonia, worst in the left lower lobe. Electronically Signed   By: Reagan Camera M.D.   On: 11/08/2023 14:20   OCT, Retina - OU - Both Eyes Result Date: 11/07/2023 Right Eye Quality was borderline. Central Foveal Thickness: 291. Progression has been stable. Findings include normal foveal contour, no IRF, no SRF, myopic contour, outer retinal atrophy (Diffusely decreased ellipsoid signal -- greatest non-centrally). Left Eye Quality was borderline. Central Foveal Thickness: 296. Progression has been stable. Findings include normal foveal contour, no IRF, no SRF, myopic contour, outer retinal atrophy (Diffusely decreased ellipsoid signal -- greatest non-centrally). Notes *Images captured and stored on drive  Diagnosis / Impression: NFP; no IRF/SRF OU Myopic contour OU Diffusely decreased ellipsoid signal -- greatest non-centrally OU Clinical management: See below Abbreviations: NFP - Normal foveal profile. CME - cystoid macular edema. PED - pigment epithelial detachment. IRF - intraretinal fluid. SRF - subretinal fluid. EZ - ellipsoid zone. ERM - epiretinal membrane. ORA - outer retinal atrophy. ORT - outer retinal tubulation. SRHM - subretinal hyper-reflective material. IRHM - intraretinal hyper-reflective material   DG Chest 2 View Result Date: 11/06/2023 CLINICAL DATA:  Suspected sepsis. EXAM: CHEST - 2 VIEW COMPARISON:  Chest radiograph dated 04/18/2023. FINDINGS: Patchy area of airspace opacity in the left lower lobe most consistent with developing infiltrate. Clinical correlation and follow-up to resolution recommended. The right lung is clear. No pleural effusion or pneumothorax. The cardiac silhouette is within limits. No acute osseous pathology. Osteopenia with multilevel degenerative changes and vertebroplasty. IMPRESSION: Left lower lobe infiltrate. Electronically Signed   By: Angus Bark M.D.   On: 11/06/2023 16:37   (Echo, Carotid, EGD, Colonoscopy, ERCP)    Subjective: Patient seen and examined.  Still has some back pain with movement.  Afebrile.  Telemetry monitor with sinus rhythm.  On room air.  She will go home, her grandson is going to stay with her.   Discharge Exam: Vitals:  11/10/23 1955 11/11/23 0402  BP: 117/61 121/68  Pulse: 86 76  Resp: 18 16  Temp: 97.9 F (36.6 C) (!) 97.4 F (36.3 C)  SpO2: 97% 97%   Vitals:   11/10/23 0346 11/10/23 1301 11/10/23 1955 11/11/23 0402  BP: 138/72 93/63 117/61 121/68  Pulse: 87 84 86 76  Resp: 16 16 18 16   Temp: 97.6 F (36.4 C) 97.8 F (36.6 C) 97.9 F (36.6 C) (!) 97.4 F (36.3 C)  TempSrc:   Oral Oral  SpO2: 93% 97% 97% 97%  Weight:      Height:        General: Pt is alert, awake, not in acute  distress Cardiovascular: RRR, S1/S2 +, no rubs, no gallops Respiratory: CTA bilaterally, no wheezing, no rhonchi Abdominal: Soft, NT, ND, bowel sounds + Extremities: no edema, no cyanosis    The results of significant diagnostics from this hospitalization (including imaging, microbiology, ancillary and laboratory) are listed below for reference.     Microbiology: Recent Results (from the past 240 hours)  Culture, blood (Routine x 2)     Status: Abnormal   Collection Time: 11/06/23  2:37 PM   Specimen: BLOOD RIGHT ARM  Result Value Ref Range Status   Specimen Description   Final    BLOOD RIGHT ARM Performed at [facility], [address] [place], [place] [address]    Special Requests   Final    BOTTLES DRAWN AEROBIC AND ANAEROBIC Blood Culture results may not be optimal due to an inadequate volume of blood received in culture bottles Performed at [facility], [address], [place], [place] [address]    Culture  Setup Time   Final    GRAM POSITIVE COCCI IN CLUSTERS AEROBIC BOTTLE ONLY CRITICAL RESULT CALLED TO, READ BACK BY AND VERIFIED WITH: PHARMD N. GLOGOVAC [id number] @ 1903 FH    Culture (A)  Final    STAPHYLOCOCCUS HOMINIS THE SIGNIFICANCE OF ISOLATING THIS ORGANISM FROM A SINGLE VENIPUNCTURE CANNOT BE PREDICTED WITHOUT FURTHER CLINICAL AND CULTURE CORRELATION. SUSCEPTIBILITIES AVAILABLE ONLY ON REQUEST. Performed at [facility], [address], [place], [place] [address]    Report Status 11/09/2023 FINAL  Final  Resp panel by RT-PCR (RSV, Flu A&B, Covid) Nasopharyngeal Swab     Status: None   Collection Time: 11/06/23  2:37 PM   Specimen: Nasopharyngeal Swab; Nasal Swab  Result Value Ref Range Status   SARS Coronavirus 2 by RT PCR NEGATIVE NEGATIVE Final    Comment: (NOTE) SARS-CoV-2 target nucleic acids are NOT DETECTED.  The SARS-CoV-2 RNA is generally detectable in upper respiratory specimens during the acute phase of infection.  The lowest concentration of SARS-CoV-2 viral copies this assay can detect is 138 copies/mL. A negative result does not preclude SARS-Cov-2 infection and should not be used as the sole basis for treatment or other patient management decisions. A negative result may occur with  improper specimen collection/handling, submission of specimen other than nasopharyngeal swab, presence of viral mutation(s) within the areas targeted by this assay, and inadequate number of viral copies(<138 copies/mL). A negative result must be combined with clinical observations, patient history, and epidemiological information. The expected result is Negative.  Fact Sheet for Patients:  BloggerCourse.com  Fact Sheet for Healthcare Providers:  SeriousBroker.it  This test is no t yet approved or cleared by the [place]  FDA and  has been authorized for detection and/or diagnosis of SARS-CoV-2 by FDA under an Emergency Use Authorization (EUA).  This EUA will remain  in effect (meaning this test can be used) for the duration of the COVID-19 declaration under Section 564(b)(1) of the Act, 21 U.S.C.section 360bbb-3(b)(1), unless the authorization is terminated  or revoked sooner.       Influenza A by PCR NEGATIVE NEGATIVE Final   Influenza B by PCR NEGATIVE NEGATIVE Final    Comment: (NOTE) The Xpert Xpress SARS-CoV-2/FLU/RSV plus assay is intended as an aid in the diagnosis of influenza from Nasopharyngeal swab specimens and should not be used as a sole basis for treatment. Nasal washings and aspirates are unacceptable for Xpert Xpress SARS-CoV-2/FLU/RSV testing.  Fact Sheet for Patients: BloggerCourse.com  Fact Sheet for Healthcare Providers: SeriousBroker.it  This test is not yet approved or cleared by the [place]  FDA and has been authorized for detection and/or diagnosis of SARS-CoV-2 by FDA  under an Emergency Use Authorization (EUA). This EUA will remain in effect (meaning this test can be used) for the duration of the COVID-19 declaration under Section 564(b)(1) of the Act, 21 U.S.C. section 360bbb-3(b)(1), unless the authorization is terminated or revoked.     Resp Syncytial Virus by PCR NEGATIVE NEGATIVE Final    Comment: (NOTE) Fact Sheet for Patients: BloggerCourse.com  Fact Sheet for Healthcare Providers: SeriousBroker.it  This test is not yet approved or cleared by the [place]  FDA and has been authorized for detection and/or diagnosis of SARS-CoV-2 by FDA under an Emergency Use Authorization (EUA). This EUA will remain in effect (meaning this test can be used) for the duration of the COVID-19 declaration under Section 564(b)(1) of the Act, 21 U.S.C. section 360bbb-3(b)(1), unless the authorization is terminated or revoked.  Performed at [facility], [address] [place], [place] [address]   Respiratory (~20 pathogens) panel by PCR     Status: None   Collection Time: 11/06/23  2:37 PM   Specimen: Nasopharyngeal Swab; Respiratory  Result Value Ref Range Status   Adenovirus NOT DETECTED NOT DETECTED Final   Coronavirus 229E NOT DETECTED NOT DETECTED Final    Comment: (NOTE) The Coronavirus on the Respiratory Panel, DOES NOT test for the novel  Coronavirus (2019 nCoV)    Coronavirus HKU1 NOT DETECTED NOT DETECTED Final   Coronavirus NL63 NOT DETECTED NOT DETECTED Final   Coronavirus OC43 NOT DETECTED NOT DETECTED Final   Metapneumovirus NOT DETECTED NOT DETECTED Final   Rhinovirus / Enterovirus NOT DETECTED NOT DETECTED Final   Influenza A NOT DETECTED NOT DETECTED Final   Influenza B NOT DETECTED NOT DETECTED Final   Parainfluenza Virus 1 NOT DETECTED NOT DETECTED Final   Parainfluenza Virus 2 NOT DETECTED NOT DETECTED Final   Parainfluenza Virus 3 NOT DETECTED NOT DETECTED  Final   Parainfluenza Virus 4 NOT DETECTED NOT DETECTED Final   Respiratory Syncytial Virus NOT DETECTED NOT DETECTED Final   Bordetella pertussis NOT DETECTED NOT DETECTED Final   Bordetella Parapertussis NOT DETECTED NOT DETECTED Final   Chlamydophila pneumoniae NOT DETECTED NOT DETECTED Final   Mycoplasma pneumoniae NOT DETECTED NOT DETECTED Final    Comment: Performed at [facility], [address] [place], [place] [address]  Blood Culture ID Panel (Reflexed)     Status: Abnormal   Collection Time: 11/06/23  2:37 PM  Result Value Ref Range Status   Enterococcus faecalis NOT DETECTED NOT DETECTED Final   Enterococcus Faecium NOT DETECTED NOT DETECTED Final   Listeria monocytogenes NOT DETECTED NOT DETECTED Final   Staphylococcus species DETECTED (A) NOT  DETECTED Final    Comment: CRITICAL RESULT CALLED TO, READ BACK BY AND VERIFIED WITH: PHARMD N. GLOGOVAC [id number] @ 1903 FH    Staphylococcus aureus (BCID) NOT DETECTED NOT DETECTED Final   Staphylococcus epidermidis NOT DETECTED NOT DETECTED Final   Staphylococcus lugdunensis NOT DETECTED NOT DETECTED Final   Streptococcus species NOT DETECTED NOT DETECTED Final   Streptococcus agalactiae NOT DETECTED NOT DETECTED Final   Streptococcus pneumoniae NOT DETECTED NOT DETECTED Final   Streptococcus pyogenes NOT DETECTED NOT DETECTED Final   A.calcoaceticus-baumannii NOT DETECTED NOT DETECTED Final   Bacteroides fragilis NOT DETECTED NOT DETECTED Final   Enterobacterales NOT DETECTED NOT DETECTED Final   Enterobacter cloacae complex NOT DETECTED NOT DETECTED Final   Escherichia coli NOT DETECTED NOT DETECTED Final   Klebsiella aerogenes NOT DETECTED NOT DETECTED Final   Klebsiella oxytoca NOT DETECTED NOT DETECTED Final   Klebsiella pneumoniae NOT DETECTED NOT DETECTED Final   Proteus species NOT DETECTED NOT DETECTED Final   Salmonella species NOT DETECTED NOT DETECTED Final   Serratia marcescens NOT DETECTED NOT DETECTED Final    Haemophilus influenzae NOT DETECTED NOT DETECTED Final   Neisseria meningitidis NOT DETECTED NOT DETECTED Final   Pseudomonas aeruginosa NOT DETECTED NOT DETECTED Final   Stenotrophomonas maltophilia NOT DETECTED NOT DETECTED Final   Candida albicans NOT DETECTED NOT DETECTED Final   Candida auris NOT DETECTED NOT DETECTED Final   Candida glabrata NOT DETECTED NOT DETECTED Final   Candida krusei NOT DETECTED NOT DETECTED Final   Candida parapsilosis NOT DETECTED NOT DETECTED Final   Candida tropicalis NOT DETECTED NOT DETECTED Final   Cryptococcus neoformans/gattii NOT DETECTED NOT DETECTED Final    Comment: Performed at [facility], [address], [place], [place] [address]  Urine Culture (for pregnant, neutropenic or urologic patients or patients with an indwelling urinary catheter)     Status: None   Collection Time: 11/06/23  7:38 PM   Specimen: Urine, Clean Catch  Result Value Ref Range Status   Specimen Description   Final    URINE, CLEAN CATCH Performed at [facility], [address] [place], [place] [address]    Special Requests   Final    NONE Performed at [facility], [address] [place], [place] [address]    Culture   Final    NO GROWTH Performed at [facility], [address], [place], [place] [address]    Report Status 11/07/2023 FINAL  Final  Culture, blood (Routine X 2) w Reflex to ID Panel     Status: None (Preliminary result)   Collection Time: 11/07/23  5:17 AM   Specimen: BLOOD  Result Value Ref Range Status   Specimen Description   Final    BLOOD LEFT ANTECUBITAL Performed at [facility], [address] [place], [place] [address]    Special Requests   Final    BOTTLES DRAWN AEROBIC ONLY Blood Culture adequate volume Performed at [facility], [address] [place], [place] [address]    Culture   Final    NO GROWTH 4 DAYS Performed at [facility], [address], [place], [place] [address]    Report Status PENDING  Incomplete     Labs: BNP (last 3 results) No results for input(s): "BNP" in the last 8760 hours. Basic Metabolic Panel: Recent Labs  Lab 11/06/23 1437 11/07/23 0517 11/08/23 0640 11/09/23 0542 11/10/23 0451  NA 137  139 136 135 137  K 3.4* 3.7 3.9 3.6 3.9  CL 107 116* 110 107 105  CO2 22 20* 22 23 22   GLUCOSE 110* 100* 88 86 104*  BUN 11 8 11 11 15   CREATININE 0.81 0.80 0.70 0.72 0.82  CALCIUM  8.1* 7.5* 7.9* 8.0* 8.4*  MG  --  1.9 1.9 1.8 2.2   Liver Function Tests: Recent Labs  Lab 11/06/23 1437 11/07/23 0517 11/08/23 0640 11/09/23 0542 11/10/23 0451  AST 48* 35 32 38 63*  ALT 33 25 25 32 47*  ALKPHOS 63 54 59 85 111  BILITOT 1.1 1.0 0.8 0.8 0.6  PROT 7.5 6.0* 5.9* 6.1* 6.4*  ALBUMIN 3.2* 2.4* 2.3* 2.3* 2.5*   No results for input(s): "LIPASE", "AMYLASE" in the last 168 hours. No results for input(s): "AMMONIA" in the last 168 hours. CBC: Recent Labs  Lab 11/06/23 1437 11/07/23 0517 11/08/23 0640 11/09/23 0542 11/10/23 0451 11/11/23 0525  WBC 18.6* 16.0* 12.4* 11.4* 10.9* 9.1  NEUTROABS 16.7*  --  9.4* 8.3* 7.5 5.7  HGB 14.3 12.4 12.4 13.4 13.4 13.1  HCT 44.8 39.1 39.9 42.1 41.2 41.2  MCV 91.8 92.4 94.1 91.1 89.2 91.6  PLT 45* 24* 27* 37* 62* 83*   Cardiac Enzymes: No results for input(s): "CKTOTAL", "CKMB", "CKMBINDEX", "TROPONINI" in the last 168 hours. BNP: Invalid input(s): "POCBNP" CBG: Recent Labs  Lab 11/06/23 1402  GLUCAP 86   D-Dimer No results for input(s): "DDIMER" in the last 72 hours. Hgb A1c No results for input(s): "HGBA1C" in the last 72 hours. Lipid Profile No results for input(s): "CHOL", "HDL", "LDLCALC", "TRIG", "CHOLHDL", "LDLDIRECT" in the last 72 hours. Thyroid  function studies Recent Labs    11/10/23 0451  TSH 1.128   Anemia work up No results for input(s): "VITAMINB12", "FOLATE", "FERRITIN", "TIBC", "IRON", "RETICCTPCT" in the last 72  hours. Urinalysis    Component Value Date/Time   COLORURINE YELLOW 11/06/2023 1938   APPEARANCEUR HAZY (A) 11/06/2023 1938   LABSPEC 1.011 11/06/2023 1938   PHURINE 5.0 11/06/2023 1938   GLUCOSEU NEGATIVE 11/06/2023 1938   HGBUR SMALL (A) 11/06/2023 1938   BILIRUBINUR NEGATIVE 11/06/2023 1938   KETONESUR 5 (A) 11/06/2023 1938   PROTEINUR NEGATIVE 11/06/2023 1938   NITRITE POSITIVE (A) 11/06/2023 1938   LEUKOCYTESUR LARGE (A) 11/06/2023 1938   Sepsis Labs Recent Labs  Lab 11/08/23 0640 11/09/23 0542 11/10/23 0451 11/11/23 0525  WBC 12.4* 11.4* 10.9* 9.1   Microbiology Recent Results (from the past 240 hours)  Culture, blood (Routine x 2)     Status: Abnormal   Collection Time: 11/06/23  2:37 PM   Specimen: BLOOD RIGHT ARM  Result Value Ref Range Status   Specimen Description   Final    BLOOD RIGHT ARM Performed at [facility], [address] [place], [place] [address]    Special Requests   Final    BOTTLES DRAWN AEROBIC AND ANAEROBIC Blood Culture results may not be optimal due to an inadequate volume of blood received in culture bottles Performed at [facility], [address], [place], [place] [address]    Culture  Setup Time   Final    GRAM POSITIVE COCCI IN CLUSTERS AEROBIC BOTTLE ONLY CRITICAL RESULT CALLED TO, READ BACK BY AND VERIFIED WITH: PHARMD N. GLOGOVAC [id number] @ 1903 FH    Culture (A)  Final    STAPHYLOCOCCUS HOMINIS THE SIGNIFICANCE OF ISOLATING THIS ORGANISM FROM A SINGLE VENIPUNCTURE CANNOT BE PREDICTED WITHOUT FURTHER  CLINICAL AND CULTURE CORRELATION. SUSCEPTIBILITIES AVAILABLE ONLY ON REQUEST. Performed at [facility], [address], [place], [place] [address]    Report Status 11/09/2023 FINAL  Final  Resp panel by RT-PCR (RSV, Flu A&B, Covid) Nasopharyngeal Swab     Status: None   Collection Time: 11/06/23  2:37 PM   Specimen: Nasopharyngeal Swab; Nasal Swab  Result Value Ref Range Status   SARS Coronavirus 2  by RT PCR NEGATIVE NEGATIVE Final    Comment: (NOTE) SARS-CoV-2 target nucleic acids are NOT DETECTED.  The SARS-CoV-2 RNA is generally detectable in upper respiratory specimens during the acute phase of infection. The lowest concentration of SARS-CoV-2 viral copies this assay can detect is 138 copies/mL. A negative result does not preclude SARS-Cov-2 infection and should not be used as the sole basis for treatment or other patient management decisions. A negative result may occur with  improper specimen collection/handling, submission of specimen other than nasopharyngeal swab, presence of viral mutation(s) within the areas targeted by this assay, and inadequate number of viral copies(<138 copies/mL). A negative result must be combined with clinical observations, patient history, and epidemiological information. The expected result is Negative.  Fact Sheet for Patients:  BloggerCourse.com  Fact Sheet for Healthcare Providers:  SeriousBroker.it  This test is no t yet approved or cleared by the [place]  FDA and  has been authorized for detection and/or diagnosis of SARS-CoV-2 by FDA under an Emergency Use Authorization (EUA). This EUA will remain  in effect (meaning this test can be used) for the duration of the COVID-19 declaration under Section 564(b)(1) of the Act, 21 U.S.C.section 360bbb-3(b)(1), unless the authorization is terminated  or revoked sooner.       Influenza A by PCR NEGATIVE NEGATIVE Final   Influenza B by PCR NEGATIVE NEGATIVE Final    Comment: (NOTE) The Xpert Xpress SARS-CoV-2/FLU/RSV plus assay is intended as an aid in the diagnosis of influenza from Nasopharyngeal swab specimens and should not be used as a sole basis for treatment. Nasal washings and aspirates are unacceptable for Xpert Xpress SARS-CoV-2/FLU/RSV testing.  Fact Sheet for Patients: BloggerCourse.com  Fact  Sheet for Healthcare Providers: SeriousBroker.it  This test is not yet approved or cleared by the [place]  FDA and has been authorized for detection and/or diagnosis of SARS-CoV-2 by FDA under an Emergency Use Authorization (EUA). This EUA will remain in effect (meaning this test can be used) for the duration of the COVID-19 declaration under Section 564(b)(1) of the Act, 21 U.S.C. section 360bbb-3(b)(1), unless the authorization is terminated or revoked.     Resp Syncytial Virus by PCR NEGATIVE NEGATIVE Final    Comment: (NOTE) Fact Sheet for Patients: BloggerCourse.com  Fact Sheet for Healthcare Providers: SeriousBroker.it  This test is not yet approved or cleared by the [place]  FDA and has been authorized for detection and/or diagnosis of SARS-CoV-2 by FDA under an Emergency Use Authorization (EUA). This EUA will remain in effect (meaning this test can be used) for the duration of the COVID-19 declaration under Section 564(b)(1) of the Act, 21 U.S.C. section 360bbb-3(b)(1), unless the authorization is terminated or revoked.  Performed at [facility], [address], [place], [place] [address]   Respiratory (~20 pathogens) panel by PCR     Status: None   Collection Time: 11/06/23  2:37 PM   Specimen: Nasopharyngeal Swab; Respiratory  Result Value Ref Range Status   Adenovirus NOT DETECTED NOT DETECTED Final   Coronavirus 229E NOT DETECTED NOT  DETECTED Final    Comment: (NOTE) The Coronavirus on the Respiratory Panel, DOES NOT test for the novel  Coronavirus (2019 nCoV)    Coronavirus HKU1 NOT DETECTED NOT DETECTED Final   Coronavirus NL63 NOT DETECTED NOT DETECTED Final   Coronavirus OC43 NOT DETECTED NOT DETECTED Final   Metapneumovirus NOT DETECTED NOT DETECTED Final   Rhinovirus / Enterovirus NOT DETECTED NOT DETECTED Final   Influenza A NOT DETECTED NOT DETECTED  Final   Influenza B NOT DETECTED NOT DETECTED Final   Parainfluenza Virus 1 NOT DETECTED NOT DETECTED Final   Parainfluenza Virus 2 NOT DETECTED NOT DETECTED Final   Parainfluenza Virus 3 NOT DETECTED NOT DETECTED Final   Parainfluenza Virus 4 NOT DETECTED NOT DETECTED Final   Respiratory Syncytial Virus NOT DETECTED NOT DETECTED Final   Bordetella pertussis NOT DETECTED NOT DETECTED Final   Bordetella Parapertussis NOT DETECTED NOT DETECTED Final   Chlamydophila pneumoniae NOT DETECTED NOT DETECTED Final   Mycoplasma pneumoniae NOT DETECTED NOT DETECTED Final    Comment: Performed at [facility], [address] [place], [place] [address]  Blood Culture ID Panel (Reflexed)     Status: Abnormal   Collection Time: 11/06/23  2:37 PM  Result Value Ref Range Status   Enterococcus faecalis NOT DETECTED NOT DETECTED Final   Enterococcus Faecium NOT DETECTED NOT DETECTED Final   Listeria monocytogenes NOT DETECTED NOT DETECTED Final   Staphylococcus species DETECTED (A) NOT DETECTED Final    Comment: CRITICAL RESULT CALLED TO, READ BACK BY AND VERIFIED WITH: PHARMD N. GLOGOVAC [id number] @ 1903 FH    Staphylococcus aureus (BCID) NOT DETECTED NOT DETECTED Final   Staphylococcus epidermidis NOT DETECTED NOT DETECTED Final   Staphylococcus lugdunensis NOT DETECTED NOT DETECTED Final   Streptococcus species NOT DETECTED NOT DETECTED Final   Streptococcus agalactiae NOT DETECTED NOT DETECTED Final   Streptococcus pneumoniae NOT DETECTED NOT DETECTED Final   Streptococcus pyogenes NOT DETECTED NOT DETECTED Final   A.calcoaceticus-baumannii NOT DETECTED NOT DETECTED Final   Bacteroides fragilis NOT DETECTED NOT DETECTED Final   Enterobacterales NOT DETECTED NOT DETECTED Final   Enterobacter cloacae complex NOT DETECTED NOT DETECTED Final   Escherichia coli NOT DETECTED NOT DETECTED Final   Klebsiella aerogenes NOT DETECTED NOT DETECTED Final   Klebsiella oxytoca NOT DETECTED NOT DETECTED  Final   Klebsiella pneumoniae NOT DETECTED NOT DETECTED Final   Proteus species NOT DETECTED NOT DETECTED Final   Salmonella species NOT DETECTED NOT DETECTED Final   Serratia marcescens NOT DETECTED NOT DETECTED Final   Haemophilus influenzae NOT DETECTED NOT DETECTED Final   Neisseria meningitidis NOT DETECTED NOT DETECTED Final   Pseudomonas aeruginosa NOT DETECTED NOT DETECTED Final   Stenotrophomonas maltophilia NOT DETECTED NOT DETECTED Final   Candida albicans NOT DETECTED NOT DETECTED Final   Candida auris NOT DETECTED NOT DETECTED Final   Candida glabrata NOT DETECTED NOT DETECTED Final   Candida krusei NOT DETECTED NOT DETECTED Final   Candida parapsilosis NOT DETECTED NOT DETECTED Final   Candida tropicalis NOT DETECTED NOT DETECTED Final   Cryptococcus neoformans/gattii NOT DETECTED NOT DETECTED Final    Comment: Performed at [facility], [address], [place], [place] [address]  Urine Culture (for pregnant, neutropenic or urologic patients or patients with an indwelling urinary catheter)     Status: None   Collection Time: 11/06/23  7:38 PM   Specimen: Urine, Clean Catch  Result Value Ref Range Status   Specimen Description  Final    URINE, CLEAN CATCH Performed at [facility], [address], [place], [place] [address]    Special Requests   Final    NONE Performed at [facility], [address] [place], [place] [address]    Culture   Final    NO GROWTH Performed at [facility], [address] [place], [place] [address]    Report Status 11/07/2023 FINAL  Final  Culture, blood (Routine X 2) w Reflex to ID Panel     Status: None (Preliminary result)   Collection Time: 11/07/23  5:17 AM   Specimen: BLOOD  Result Value Ref Range Status   Specimen Description   Final    BLOOD LEFT ANTECUBITAL Performed at [facility], [address] [place], [place] [address]    Special Requests   Final    BOTTLES  DRAWN AEROBIC ONLY Blood Culture adequate volume Performed at [facility], [address], [place], [place] [address]    Culture   Final    NO GROWTH 4 DAYS Performed at [facility], [address] [place], [place] [address]    Report Status PENDING  Incomplete     Time coordinating discharge: 40 minutes  SIGNED:   Vada Garibaldi, MD  [facility] 11/11/2023, 8:51 AM

## 2023-11-12 LAB — CULTURE, BLOOD (ROUTINE X 2)
Culture: NO GROWTH
Special Requests: ADEQUATE

## 2023-11-15 ENCOUNTER — Other Ambulatory Visit: Payer: Self-pay | Admitting: Cardiology

## 2023-11-15 DIAGNOSIS — I700 Atherosclerosis of aorta: Secondary | ICD-10-CM

## 2023-12-19 ENCOUNTER — Encounter: Payer: Self-pay | Admitting: Cardiology

## 2023-12-19 ENCOUNTER — Ambulatory Visit: Attending: Cardiology | Admitting: Cardiology

## 2023-12-19 ENCOUNTER — Ambulatory Visit

## 2023-12-19 VITALS — BP 120/77 | HR 80 | Resp 16 | Ht 61.0 in | Wt 151.0 lb

## 2023-12-19 DIAGNOSIS — I2510 Atherosclerotic heart disease of native coronary artery without angina pectoris: Secondary | ICD-10-CM | POA: Insufficient documentation

## 2023-12-19 DIAGNOSIS — D696 Thrombocytopenia, unspecified: Secondary | ICD-10-CM | POA: Diagnosis present

## 2023-12-19 DIAGNOSIS — R002 Palpitations: Secondary | ICD-10-CM | POA: Diagnosis present

## 2023-12-19 NOTE — Progress Notes (Unsigned)
 Enrolled patient for a 14 day Zio XT  monitor to be mailed to patients home

## 2023-12-19 NOTE — Progress Notes (Addendum)
 " Cardiology Office Note:  .   Date:  12/20/2023  ID:  Jane Chavez, DOB [birth year], MRN [id number] PCP: [facility]  [facility] Providers Cardiologist:  Gordy Bergamo, MD   History of Present Illness: .   Jane Chavez is a 78 y.o. African-American female patient with prior history of tobacco use disorder history of lupus, GERD, hypercholesterolemia, mild abdominal aortic atherosclerosis by ultrasound of the abdomen and CT of the abdomen pelvis performed in 2024 along with hepatic steatosis coronary calcification noted on the CT scan of the chest.  Presents for follow-up of dyspnea on exertion and chest pain, underwent cardiac stress testing.  She has had an echocardiogram on 05/27/2023 revealing normal LVEF with grade 1 diastolic dysfunction, no other significant valvular abnormality.  She also had a new inferolateral T wave inversion on the EKG.  She was started on atorvastatin .  Except for occasional palpitations, states that her dyspnea has improved and she has not had any further chest pain.  Nuclear stress test on 09/01/2023 revealing no evidence of ischemia with normal LVEF, low risk.  Discussed the use of AI scribe software for clinical note transcription with the patient, who gave verbal consent to proceed.  History of Present Illness Jane Chavez is a 78 year old female with lupus who presents for evaluation of chest pain and dyspnea on exertion and now presents with low platelet count and recent hospitalization for sepsis and pneumonia on 11/06/2023.  During her recent hospitalization, she experienced a brief episode of atrial fibrillation while febrile and septic. She occasionally experiences heart racing symptoms lasting only a few seconds. A stress test on September 01, 2023, was normal, and an echocardiogram in December 2024 showed normal heart function.  She is on atorvastatin  10 mg once daily for cholesterol management. She is attempting weight loss by  eating less and incorporating juices like beet, carrot, and apple into her diet. She has no shortness of breath with exertion.  Denies further chest pain.  Generally feels weak since recent hospitalization.  Labs    Lab Results  Component Value Date   NA 137 11/10/2023   K 3.9 11/10/2023   CO2 22 11/10/2023   GLUCOSE 104 (H) 11/10/2023   BUN 15 11/10/2023   CREATININE 0.82 11/10/2023   CALCIUM  8.4 (L) 11/10/2023   GFR 73.88 01/26/2018   GFRNONAA >60 11/10/2023      Latest Ref Rng & Units 11/10/2023    4:51 AM 11/09/2023    5:42 AM 11/08/2023    6:40 AM  BMP  Glucose 70 - 99 mg/dL 895  86  88   BUN 8 - 23 mg/dL 15  11  11    Creatinine 0.44 - 1.00 mg/dL 9.17  9.27  9.29   Sodium 135 - 145 mmol/L 137  135  136   Potassium 3.5 - 5.1 mmol/L 3.9  3.6  3.9   Chloride 98 - 111 mmol/L 105  107  110   CO2 22 - 32 mmol/L 22  23  22    Calcium  8.9 - 10.3 mg/dL 8.4  8.0  7.9       Latest Ref Rng & Units 11/11/2023    5:25 AM 11/10/2023    4:51 AM 11/09/2023    5:42 AM  CBC  WBC 4.0 - 10.5 K/uL 9.1  10.9  11.4   Hemoglobin 12.0 - 15.0 g/dL 86.8  86.5  86.5   Hematocrit 36.0 - 46.0 % 41.2  41.2  42.1   Platelets 150 - 400 K/uL 83  62  37    No results found for: HGBA1C  Lab Results  Component Value Date   TSH 1.128 11/10/2023     ROS  Review of Systems  Cardiovascular:  Positive for palpitations. Negative for chest pain, dyspnea on exertion and leg swelling.   Physical Exam:   VS:  BP 120/77 (BP Location: Left Arm, Patient Position: Sitting, Cuff Size: Large)   Pulse 80   Resp 16   Ht 5' 1 (1.549 m)   Wt 151 lb (68.5 kg)   SpO2 92%   BMI 28.53 kg/m    Wt Readings from Last 3 Encounters:  12/19/23 151 lb (68.5 kg)  11/06/23 150 lb (68 kg)  09/01/23 156 lb (70.8 kg)    Physical Exam Constitutional:      Appearance: She is obese.  Neck:     Vascular: No carotid bruit or JVD.  Cardiovascular:     Rate and Rhythm: Normal rate and regular rhythm.     Heart sounds: Normal  heart sounds. No murmur heard.    No gallop.  Pulmonary:     Effort: Pulmonary effort is normal.     Breath sounds: Normal breath sounds.  Abdominal:     General: Bowel sounds are normal.     Palpations: Abdomen is soft.  Musculoskeletal:     Right lower leg: No edema.     Left lower leg: No edema.    Studies Reviewed: .    MYOCARDIAL PERFUSION IMAGING 09/01/2023   Interpretation Summary   The study is normal. The study is low risk.   No ST deviation was noted.   LV perfusion is normal.   Left ventricular function is normal. Nuclear stress EF: 63%. The left ventricular ejection fraction is normal (55-65%). End diastolic cavity size is normal. End systolic cavity size is normal.   Prior study not available for comparison.  ECHOCARDIOGRAM COMPLETE 05/27/2023  1. Left ventricular ejection fraction, by estimation, is 60 to 65%. The left ventricle has normal function. The left ventricle has no regional wall motion abnormalities. There is mild left ventricular hypertrophy of the septal segment. Left ventricular diastolic parameters are consistent with Grade I diastolic dysfunction (impaired relaxation). 2. Right ventricular systolic function is normal. The right ventricular size is mildly enlarged. There is normal pulmonary artery systolic pressure. The estimated right ventricular systolic pressure is 32.6 mmHg. 3. The mitral valve is normal in structure. No evidence of mitral valve regurgitation. No evidence of mitral stenosis. Moderate to severe mitral annular calcification. 4. The aortic valve is normal in structure. Aortic valve regurgitation is not visualized. No aortic stenosis is present. 5. Pulmonic valve regurgitation is moderate. 6. The inferior vena cava is normal in size with greater than 50% respiratory variability, suggesting right atrial pressure of 3 mmHg.  EKG:    EKG Interpretation Date/Time:  Friday December 19 2023 08:42:02 EDT Ventricular Rate:  74 PR  Interval:  166 QRS Duration:  86 QT Interval:  390 QTC Calculation: 432 R Axis:   2  Text Interpretation: EKG 12/19/2023: Normal sinus rhythm with occasional PACs (2), otherwise normal EKG.  Compared to 11/09/2023, atrial fibrillation no longer present. Confirmed by Janai Brannigan, Jagadeesh ([id number]) on 12/20/2023 6:30:54 AM    Medications ordered    No orders of the defined types were placed in this encounter.    ASSESSMENT AND PLAN: .      ICD-10-CM   1. Coronary artery  calcification seen on CAT scan  I25.10     2. Palpitations  R00.2 EKG 12-Lead    LONG TERM MONITOR (3-14 DAYS)    3. Thrombocytopenia (HCC)  D69.6 Ambulatory referral to Hematology / Oncology      Assessment and Plan Assessment & Plan Thrombocytopenia Severe thrombocytopenia with platelet count of 18. Possible causes include recent infection or medication side effects. Atorvastatin , a potential but rare cause, will be discontinued. Referral to hematologist for further evaluation. - Refer to hematologist for evaluation of thrombocytopenia - Discontinue atorvastatin   Atrial fibrillation (resolved) Brief episode of atrial fibrillation during hospitalization for sepsis, resolved. Occasional palpitations reported, likely benign PACs and PVCs. No indication for anticoagulation due to resolved AFib and thrombocytopenia. - Order cardiac monitor for 2 weeks to assess for AFib - Evaluate monitor results to determine need for anticoagulation - Her symptoms of palpitations that are present are brief with occasional skipped beats especially at rest and do not suggest AF.  Sepsis Sepsis secondary to bilateral pneumonia and kidney infection, resolved. Treated with antibiotics and discharged.  Lupus Lupus with recent discontinuation of Imuran by rheumatologist.  Hyperlipidemia Hyperlipidemia managed with atorvastatin . Goal LDL is less than 100, ideally closer to 70. - Coordinate with primary care for lipid management - Ensure LDL  target of less than 100, ideally closer to 70  Fatty liver Fatty liver noted on previous imaging. Encourage weight loss and avoidance or excess fat in diet.    OV PRN.  Signed,  Gordy Bergamo, MD, [facility] 12/20/2023, 6:31 AM [facility] [address] [place], [place] [address] Phone: [phone]. Fax:  [phone]  "

## 2023-12-19 NOTE — Patient Instructions (Signed)
 Medication Instructions:  Your physician recommends that you continue on your current medications as directed. Please refer to the Current Medication list given to you today.  *If you need a refill on your cardiac medications before your next appointment, please call your pharmacy*  Lab Work: none If you have labs (blood work) drawn today and your tests are completely normal, you will receive your results only by: MyChart Message (if you have MyChart) OR A paper copy in the mail If you have any lab test that is abnormal or we need to change your treatment, we will call you to review the results.  Testing/Procedures: Dr Ladona has requested you wear a zio monitor for 2 weeks.   Follow-Up: At [facility], you and your health needs are our priority.  As part of our continuing mission to provide you with exceptional heart care, our providers are all part of one team.  This team includes your primary Cardiologist (physician) and Advanced Practice Providers or APPs (Physician Assistants and Nurse Practitioners) who all work together to provide you with the care you need, when you need it.  Your next appointment:   As needed  Provider:   Gordy Ladona, MD    We recommend signing up for the patient portal called MyChart.  Sign up information is provided on this After Visit Summary.  MyChart is used to connect with patients for Virtual Visits (Telemedicine).  Patients are able to view lab/test results, encounter notes, upcoming appointments, etc.  Non-urgent messages can be sent to your provider as well.   To learn more about what you can do with MyChart, go to [email or web address].   Other Instructions You have been referred to hematology  ZIO XT- Long Term Monitor Instructions  Your physician has requested you wear a ZIO patch monitor for 14 days.  This is a single patch monitor. [employer] supplies one patch monitor per enrollment. Additional stickers are not available. Please do  not apply patch if you will be having a Nuclear Stress Test,  Echocardiogram, Cardiac CT, MRI, or Chest Xray during the period you would be wearing the  monitor. The patch cannot be worn during these tests. You cannot remove and re-apply the  ZIO XT patch monitor.  Your ZIO patch monitor will be mailed 3 day USPS to your address on file. It may take 3-5 days  to receive your monitor after you have been enrolled.  Once you have received your monitor, please review the enclosed instructions. Your monitor  has already been registered assigning a specific monitor serial # to you.  Billing and Patient Assistance Program Information  We have supplied [employer] with any of your insurance information on file for billing purposes. [employer] offers a sliding scale Patient Assistance Program for patients that do not have  insurance, or whose insurance does not completely cover the cost of the ZIO monitor.  You must apply for the Patient Assistance Program to qualify for this discounted rate.  To apply, please call [employer] at [phone], select option 4, select option 2, ask to apply for  Patient Assistance Program. Meredeth will ask your household income, and how many people  are in your household. They will quote your out-of-pocket cost based on that information.  [employer] will also be able to set up a 110-month, interest-free payment plan if needed.  Applying the monitor   Shave hair from upper left chest.  Hold abrader disc by orange tab. Rub abrader in 40 strokes over the upper left  chest as  indicated in your monitor instructions.  Clean area with 4 enclosed alcohol  pads. Let dry.  Apply patch as indicated in monitor instructions. Patch will be placed under collarbone on left  side of chest with arrow pointing upward.  Rub patch adhesive wings for 2 minutes. Remove white label marked 1. Remove the white  label marked 2. Rub patch adhesive wings for 2 additional minutes.  While looking in a  mirror, press and release button in center of patch. A small green light will  flash 3-4 times. This will be your only indicator that the monitor has been turned on.  Do not shower for the first 24 hours. You may shower after the first 24 hours.  Press the button if you feel a symptom. You will hear a small click. Record Date, Time and  Symptom in the Patient Logbook.  When you are ready to remove the patch, follow instructions on the last 2 pages of Patient  Logbook. Stick patch monitor onto the last page of Patient Logbook.  Place Patient Logbook in the blue and white box. Use locking tab on box and tape box closed  securely. The blue and white box has prepaid postage on it. Please place it in the mailbox as  soon as possible. Your physician should have your test results approximately 7 days after the  monitor has been mailed back to [facility].  Call [facility] Customer Care at [phone] if you have questions regarding  your ZIO XT patch monitor. Call them immediately if you see an orange light blinking on your  monitor.  If your monitor falls off in less than 4 days, contact our Monitor department at [phone].  If your monitor becomes loose or falls off after 4 days call [employer] at [phone] for  suggestions on securing your monitor

## 2023-12-22 ENCOUNTER — Other Ambulatory Visit: Payer: Self-pay | Admitting: Family Medicine

## 2023-12-22 DIAGNOSIS — Z1231 Encounter for screening mammogram for malignant neoplasm of breast: Secondary | ICD-10-CM

## 2023-12-24 ENCOUNTER — Encounter: Payer: Self-pay | Admitting: Family Medicine

## 2024-01-02 ENCOUNTER — Ambulatory Visit

## 2024-01-13 ENCOUNTER — Inpatient Hospital Stay

## 2024-01-13 ENCOUNTER — Inpatient Hospital Stay: Attending: Hematology | Admitting: Hematology

## 2024-01-13 NOTE — Progress Notes (Deleted)
 Patient no-show for consultation

## 2024-01-26 ENCOUNTER — Ambulatory Visit: Payer: Self-pay | Admitting: Cardiology

## 2024-01-26 DIAGNOSIS — R002 Palpitations: Secondary | ICD-10-CM

## 2024-01-26 NOTE — Progress Notes (Signed)
 Her event monitor reveals brief atrial tachycardia and PACs, no other significant arrhythmias, no atrial fibrillation.

## 2024-02-02 ENCOUNTER — Other Ambulatory Visit: Payer: Self-pay

## 2024-02-02 ENCOUNTER — Telehealth: Payer: Self-pay | Admitting: Cardiology

## 2024-02-02 DIAGNOSIS — Z1231 Encounter for screening mammogram for malignant neoplasm of breast: Secondary | ICD-10-CM

## 2024-02-02 NOTE — Telephone Encounter (Signed)
 Pt dropped off surgery clerance paperwork for Dr. Ladona to fill out. Pt does not want it faxed in, she would like call to come back and pick it up. Pt #: [phone].  Location: Dr. Ladona mailbox

## 2024-02-03 NOTE — Telephone Encounter (Signed)
 Paperwork given to preop clearance team

## 2024-02-03 NOTE — Telephone Encounter (Signed)
   Pre-operative Risk Assessment    Patient Name: Jane Chavez  DOB: [birth year] MRN: [id number]   Date of last office visit: 12/19/23 Date of next office visit: N/A   Request for Surgical Clearance    Procedure:  L2 kyphoplasty   Date of Surgery:  Clearance TBD                                 Surgeon:   Surgeon's Group or Practice Name:  [facility]  Phone number:  [phone] Fax number:  [phone]   Type of Clearance Requested:   - Medical    Type of Anesthesia:  Not Indicated   Additional requests/questions:    Bonney Rebeca Blight   02/03/2024, 5:52 PM

## 2024-02-04 NOTE — Telephone Encounter (Signed)
 Dr. Ladona You saw this patient recently after a hospitalization with Afib in the setting of sepsis. Heart monitor placed did not show Afib. Also recent nuclear stress test negative for ischemia.   We are now asked for preop risk evaluation prior to L2 kyphoplasty. Since you recently saw her in the clinic, can you please comment on risk?

## 2024-02-04 NOTE — Telephone Encounter (Signed)
   Name: Jane Chavez  DOB: 1945/11/05  MRN: [id number]   Primary Cardiologist: Gordy Bergamo, MD  Chart reviewed as part of pre-operative protocol coverage.   She was recently seen by Dr. Bergamo with no unstable cardiac issues. Per Dr. Bergamo, she is low risk and may proceed with surgery.   Therefore, based on ACC/AHA guidelines, the patient would be at acceptable risk for the planned procedure without further cardiovascular testing.   I will route this recommendation to the requesting party via Epic fax function and remove from pre-op pool. Please call with questions.  Jon Garre Erven Ramson, PA 02/04/2024, 11:04 AM

## 2024-02-04 NOTE — Telephone Encounter (Signed)
 She does not want the letter faxed to the surgeon, she dropped off the letter, if you have it she can go through surgery low risk otherwise am happy to fill this as well, she will come and pick this up.  She is not on anticoagulation.

## 2024-02-27 ENCOUNTER — Ambulatory Visit
Admission: RE | Admit: 2024-02-27 | Discharge: 2024-02-27 | Disposition: A | Discharge status: Home / Self Care | Source: Ambulatory Visit | Attending: Family Medicine | Admitting: Family Medicine

## 2024-02-27 DIAGNOSIS — Z1231 Encounter for screening mammogram for malignant neoplasm of breast: Secondary | ICD-10-CM

## 2024-11-03 ENCOUNTER — Encounter (INDEPENDENT_AMBULATORY_CARE_PROVIDER_SITE_OTHER): Admitting: Ophthalmology
